# Patient Record
Sex: Male | Born: 1945
Health system: Southern US, Community
[De-identification: ages and names within clinical notes are randomized; demographics above are authoritative.]

## PROBLEM LIST (undated history)

## (undated) DIAGNOSIS — I10 Essential (primary) hypertension: Secondary | ICD-10-CM

## (undated) DIAGNOSIS — I82409 Acute embolism and thrombosis of unspecified deep veins of unspecified lower extremity: Secondary | ICD-10-CM

## (undated) DIAGNOSIS — E7800 Pure hypercholesterolemia, unspecified: Secondary | ICD-10-CM

## (undated) HISTORY — PX: OTHER SURGICAL HISTORY: SHX169

## (undated) HISTORY — PX: CYSTECTOMY: SHX5119

## (undated) HISTORY — DX: Pure hypercholesterolemia, unspecified: E78.00

## (undated) HISTORY — PX: CATARACT EXTRACTION, BILATERAL: SHX1313

## (undated) HISTORY — PX: TONSILLECTOMY: SUR1361

---

## 2001-08-30 ENCOUNTER — Ambulatory Visit (HOSPITAL_COMMUNITY): Admission: RE | Admit: 2001-08-30 | Discharge: 2001-08-30 | Payer: Self-pay | Admitting: General Surgery

## 2002-08-14 ENCOUNTER — Ambulatory Visit (HOSPITAL_COMMUNITY): Admission: RE | Admit: 2002-08-14 | Discharge: 2002-08-14 | Payer: Self-pay | Admitting: Family Medicine

## 2002-08-14 ENCOUNTER — Encounter: Payer: Self-pay | Admitting: Family Medicine

## 2003-09-17 ENCOUNTER — Ambulatory Visit (HOSPITAL_COMMUNITY): Admission: RE | Admit: 2003-09-17 | Discharge: 2003-09-17 | Payer: Self-pay | Admitting: Family Medicine

## 2004-12-01 ENCOUNTER — Ambulatory Visit: Payer: Self-pay | Admitting: Orthopedic Surgery

## 2005-08-03 ENCOUNTER — Ambulatory Visit: Payer: Self-pay | Admitting: Orthopedic Surgery

## 2007-04-20 ENCOUNTER — Ambulatory Visit (HOSPITAL_COMMUNITY): Admission: RE | Admit: 2007-04-20 | Discharge: 2007-04-20 | Payer: Self-pay | Admitting: Ophthalmology

## 2007-05-30 ENCOUNTER — Ambulatory Visit (HOSPITAL_BASED_OUTPATIENT_CLINIC_OR_DEPARTMENT_OTHER): Admission: RE | Admit: 2007-05-30 | Discharge: 2007-05-30 | Payer: Self-pay | Admitting: Urology

## 2007-12-12 ENCOUNTER — Ambulatory Visit (HOSPITAL_COMMUNITY): Admission: RE | Admit: 2007-12-12 | Discharge: 2007-12-12 | Payer: Self-pay | Admitting: Family Medicine

## 2008-01-23 ENCOUNTER — Ambulatory Visit (HOSPITAL_COMMUNITY): Admission: RE | Admit: 2008-01-23 | Discharge: 2008-01-23 | Payer: Self-pay | Admitting: General Surgery

## 2008-01-23 ENCOUNTER — Encounter (INDEPENDENT_AMBULATORY_CARE_PROVIDER_SITE_OTHER): Payer: Self-pay | Admitting: General Surgery

## 2011-03-03 NOTE — H&P (Signed)
NAME:  Matthew Payne, Matthew Payne               ACCOUNT NO.:  [id number]   MEDICAL RECORD NO.:  [id number]          PATIENT TYPE:  AMB   LOCATION:  DAY                           FACILITY:  [facility]   PHYSICIAN:  Tilford Pillar, MD      DATE OF BIRTH:  [birth year]   DATE OF ADMISSION:  DATE OF DISCHARGE:  LH                              HISTORY & PHYSICAL   CHIEF COMPLAINT:  1. Left second finger lesion.  2. Right ear lesion.   HISTORY OF PRESENT ILLNESS:  The patient is a 65 year old male who  presented to my office from a referral from Dr. Nobie Putnam with two  separate issues. The first was a small nodule in the ventral aspect of  his second finger of his left hand. This was over the distal phalange  just proximal to the nailbed. He had noticed this for quite a while with  no significant change in size. It is not painful other than upon  traumatizing with hitting it against surfaces while working, but he has  not noticed any discharge, no bleeding, has had no paresthesias around  the area. He has had it incised before with no significant resolution.  His second issue is that he presented with some dry, tender excoriation  on his right ear on the superior portion of the arch. He had a prior  lesion which was excised by a otolaryngologist in [place] for  suspicion of sun-damage exposure and possible early malignancy. This was  excised quite a while ago. He thought it had healed, and then noted to  have increasing redness and discomfort in this area. He has had no  drainage. He has had no additional treatment.   PAST MEDICAL HISTORY:  Hypercholesterolemia.   PAST SURGICAL HISTORY:  Above mentioned excision on the right ear.  Otherwise, unremarkable.   MEDICATIONS:  1. Simvastatin.  2. Zolpidem.  3. Daily vitamins.   ALLERGIES:  No known drug allergies.   SOCIAL HISTORY:  He is not a current tobacco smoker. He quit in 1979. No  alcohol use. No recreational drug use. Occupation:  Naval architect.  He  does have pertinent family history of cancers. Otherwise, unremarkable.   REVIEW OF SYSTEMS:  CONSTITUTIONAL:  Unremarkable.  EYES:  Unremarkable.  EARS, NOSE, THROAT:  Unremarkable.  RESPIRATORY:  Unremarkable.  CARDIOVASCULAR:  Unremarkable.  GASTROINTESTINAL:  Unremarkable.  GENITOURINARY:  Unremarkable.  MUSCULOSKELETAL:  Unremarkable.  SKIN:  As per history of present illness with both finger and ear  lesions. Otherwise unremarkable.  ENDOCRINE:  Unremarkable.  NEURO:  Unremarkable.   PHYSICAL EXAMINATION:  GENERAL:  The patient is a healthy-appearing male  in no acute distress. He is alert and oriented x3.  HEENT:  Scalp:  No deformities. No masses. Eyes:  Pupils equal, round,  and reactive. Extraocular movements are intact. No conjunctival pallor  is noted. On evaluation of the ears, he has no diminished hearing. On  the right ear, he does have a flat, plaque-like lesion which is dry with  some flaking skin noted. No ulceration. This is nontender, somewhat  rubor in color, nonblanching.  Oral:  Mucosa is pink, moist.  NECK:  Trachea is midline. No cervical lymphadenopathy is appreciated.  PULMONARY:  Unlabored respirations. He is clear to auscultation in the  bilateral lung fields.  CARDIOVASCULAR:  Regular rate and rhythm. He has 2+ radial pulses  bilaterally.  ABDOMEN:  Soft, no masses. Nontender.  SKIN:  Warm and dry. On his left second digit, he does have a verrucous  lesion on the ventral surface of the distal phalanx. This is nontender.  It does not appear to be attached to underlying surfaces.   ASSESSMENT AND PLAN:  Seborrheic keratosis or similar sun damage of the  right ear and verrucous lesion of left ventral second digit.  1. In regards to the ear, we did discuss the patient's cautious      monitoring of this area. Should it continue or continue to cause      him problems I would recommend referral to a dermatologist for      evaluation and treatment. At  this point, would not recommend      additional debridement or biopsying of the site unless changes      occur. The patient understands this and will continue to monitor.  2. In regards to the verrucous of the left second finger I did discuss      options of freezing, cauterization as well as excision. Due to the      location I think from a pain and comfort standpoint a simple      excision would be beneficial. Due to the location and limiting      local anesthetic and avoiding any epinephrine I would recommend      doing this in the minor procedure room at the hospital. This will      be scheduled at the patient's earliest convenience. The risks,      benefits, and alternatives of the excision were discussed with the      patient.      Tilford Pillar, MD  Electronically Signed     BZ/MEDQ  D:  01/03/2008  T:  01/03/2008  Job:  [id number]   cc:   Tilford Pillar, MD  Fax: [phone]

## 2011-03-03 NOTE — Op Note (Signed)
NAME:  Matthew Payne, Matthew Payne               ACCOUNT NO.:  [id number]   MEDICAL RECORD NO.:  [id number]          PATIENT TYPE:  AMB   LOCATION:  [facility]                         FACILITY:  [facility]   PHYSICIAN:  Mark C. Vernie Ammons, M.D.  DATE OF BIRTH:  1945/12/13   DATE OF PROCEDURE:  05/30/2007  DATE OF DISCHARGE:                               OPERATIVE REPORT   PREOPERATIVE DIAGNOSIS:  Peyronie's disease.   POSTOPERATIVE DIAGNOSIS:  Peyronie's disease.   PROCEDURE PERFORMED:  Nesbit plication.   SURGEON:  Mark C. Vernie Ammons, M.D.   RESIDENT:  Roselyn Bering, M.D.   ANESTHESIA:  General.   INDICATION FOR PROCEDURE:  Matthew Payne is a 65 year old male with a  history of Peyronie's plaques.  He notes dorsal curvature.  He desired  surgical correction.  He has been informed of the risks and benefits of  the procedure including residual curvature and shortening of the penile  shaft.   DESCRIPTION OF PROCEDURE:  The patient was brought to the operating  room.  He was identified by his arm band.  Informed consent was verified  and a preoperative timeout was performed.  The operative site was shaved  and prepped and draped in usual fashion.  A 16-French Foley catheter was  inserted transurethrally into the bladder and the balloon inflated with  10 mL of sterile water and the bladder was drained.  A circumcising  incision was made in the subcoronal location.  Blunt and sharp  dissection was then used to deglove the penile shaft.  Once the skin had  been taken back, we used Bovie cautery to gain hemostasis.  A tourniquet  was then applied at the base of the penis and an artificial erection was  induced with injectable saline.  We noted a distal dorsal curvature.  The point of maximum curvature was marked on the opposite site on the  ventrum of the corpus cavernosum.  We then made 2 linear incisions at  the site of maximum curvature.  A 3-0 Ethibond was then used to close  the apices of these in a  Heineke-Mikulicz fashion.  We then re-injected  and noted that there was still some residual curvature and lateral glans  tilt.  A second incision was made laterally and about 1 cm more distal  on the penile shaft in the left corpora; this again was closed in a  Heineke-Mikulicz fashion.  We then again tested with injectable saline  and noted a straight shaft and an excellent cosmetic result.  Bovie  cautery was used to again gain excellent hemostasis.  The skin of the  circumcising incision was reapproximated and closed with 2 running 3-0  chromics.  A dressing was then applied.  The patient tolerated the  procedure well and there were no complications.   Mark C. Vernie Ammons, M.D. was the attending primary responsive physician and  was present and participated in all aspects of the procedure.   DISPOSITION:  The patient was extubated uneventfully and transferred  safely to the postanesthesia care unit.  He was given a prescription for  Tylox, #36, and Keflex 250 mg, #  9.     ______________________________  Roselyn Bering, MD      Veverly Fells. Vernie Ammons, M.D.  Electronically Signed    JR/MEDQ  D:  05/30/2007  T:  05/31/2007  Job:  [id number]

## 2011-04-30 ENCOUNTER — Encounter (INDEPENDENT_AMBULATORY_CARE_PROVIDER_SITE_OTHER): Payer: Self-pay | Admitting: Internal Medicine

## 2011-05-27 MED ORDER — SODIUM CHLORIDE 0.45 % IV SOLN
Freq: Once | INTRAVENOUS | Status: AC
  Administered 2011-05-28: 09:00:00 via INTRAVENOUS

## 2011-05-28 ENCOUNTER — Ambulatory Visit (HOSPITAL_COMMUNITY)
Admission: RE | Admit: 2011-05-28 | Discharge: 2011-05-28 | Disposition: A | Discharge status: Home / Self Care | Payer: 59 | Source: Ambulatory Visit | Attending: Internal Medicine | Admitting: Internal Medicine

## 2011-05-28 ENCOUNTER — Encounter (HOSPITAL_COMMUNITY): Payer: Self-pay | Admitting: *Deleted

## 2011-05-28 ENCOUNTER — Encounter (INDEPENDENT_AMBULATORY_CARE_PROVIDER_SITE_OTHER): Payer: Self-pay | Admitting: Internal Medicine

## 2011-05-28 ENCOUNTER — Other Ambulatory Visit (INDEPENDENT_AMBULATORY_CARE_PROVIDER_SITE_OTHER): Payer: Self-pay | Admitting: Internal Medicine

## 2011-05-28 ENCOUNTER — Encounter (HOSPITAL_COMMUNITY)
Admission: RE | Disposition: A | Discharge status: Home / Self Care | Payer: Self-pay | Source: Ambulatory Visit | Attending: Internal Medicine

## 2011-05-28 DIAGNOSIS — Z1211 Encounter for screening for malignant neoplasm of colon: Secondary | ICD-10-CM

## 2011-05-28 DIAGNOSIS — D126 Benign neoplasm of colon, unspecified: Secondary | ICD-10-CM | POA: Insufficient documentation

## 2011-05-28 DIAGNOSIS — Z7982 Long term (current) use of aspirin: Secondary | ICD-10-CM | POA: Insufficient documentation

## 2011-05-28 DIAGNOSIS — I10 Essential (primary) hypertension: Secondary | ICD-10-CM | POA: Insufficient documentation

## 2011-05-28 DIAGNOSIS — K644 Residual hemorrhoidal skin tags: Secondary | ICD-10-CM | POA: Insufficient documentation

## 2011-05-28 DIAGNOSIS — Z79899 Other long term (current) drug therapy: Secondary | ICD-10-CM | POA: Insufficient documentation

## 2011-05-28 HISTORY — PX: COLONOSCOPY: SHX5424

## 2011-05-28 HISTORY — DX: Essential (primary) hypertension: I10

## 2011-05-28 SURGERY — COLONOSCOPY
Anesthesia: Moderate Sedation

## 2011-05-28 MED ORDER — MEPERIDINE HCL 50 MG/ML IJ SOLN
INTRAMUSCULAR | Status: DC | PRN
  Administered 2011-05-28 (×2): 25 mg via INTRAVENOUS

## 2011-05-28 MED ORDER — MIDAZOLAM HCL 5 MG/5ML IJ SOLN
INTRAMUSCULAR | Status: DC | PRN
  Administered 2011-05-28: 1 mg via INTRAVENOUS
  Administered 2011-05-28 (×2): 2 mg via INTRAVENOUS

## 2011-05-28 NOTE — Op Note (Signed)
COLONOSCOPY PROCEDURE REPORT  PATIENT:  Matthew Payne  MR#:  [id number] Birthdate:  09-21-1946, 65 y.o., male Endoscopist:  Dr. Malissa Hippo, MD Referred By:  Dr. Artis Delay. MD. Procedure Date: 05/28/2011  Procedure:   Colonoscopy  Indications:  Average risk screening colonoscopy  Informed consent; procedure and risks were reviewed with the patient; questions were answered and informed consent was obtained.  Medications:  Demerol 50 mg IV Versed 5 mg IV  Description of procedure:  After a digital rectal exam was performed, that colonoscope was advanced from the anus through the rectum and colon to the area of the cecum, ileocecal valve and appendiceal orifice. The cecum was deeply intubated. These structures were well-seen and photographed for the record. From the level of the cecum and ileocecal valve, the scope was slowly and cautiously withdrawn. The mucosal surfaces were carefully surveyed utilizing scope tip to flexion to facilitate fold flattening as needed. The scope was pulled down into the rectum where a thorough exam including retroflexion was performed.  Findings:   Prep excellent. 2 small polyps at sigmoid colon; these are ablated via cold biopsy and submitted in one container. Small external hemorrhoids.  Therapeutic/Diagnostic Maneuvers Performed:  Both polyps moved via cold biopsy as above.  Complications:  None  Cecal Withdrawal Time:  14 minutes  Impression:  2 small polyps ablated via cold biopsy from sigmoid colon and submitted in one container. Small external hemorrhoids.  Recommendations:  Resume  usual medications and diet. No driving for 78-GNFA. Physician will contact you with biopsy results.  Lashandra Arauz U  05/28/2011 9:50 AM  CC: Dr. Artis Delay MD.

## 2011-05-28 NOTE — H&P (Signed)
Matthew Payne is an 65 y.o. male.   Chief Complaint: For colonoscopy HPI: Patient is 65 year old Caucasian male who is in for average risk screening colonoscopy. His last exam was 10 years ago. He denies recent change in his bowel habits abdominal pain melena or rectal bleeding. He has good appetite and has not lost any weight recently. Him history is negative for colorectal carcinoma.  Past Medical History  Diagnosis Date  . Hypertension     Past Surgical History  Procedure Date  . Tonsillectomy   . Cystectomy     back & leg    No family history on file. Social History:  reports that he quit smoking about 33 years ago. He does not have any smokeless tobacco history on file. He reports that he drinks alcohol. He reports that he does not use illicit drugs.  Allergies: No Known Allergies  Medications Prior to Admission  Medication Dose Route Frequency Provider Last Rate Last Dose  . 0.45 % sodium chloride infusion   Intravenous Once Malissa Hippo, MD 20 mL/hr at 05/28/11 0859     Medications Prior to Admission  Medication Sig Dispense Refill  . aspirin 81 MG tablet Take 81 mg by mouth daily.        . enalapril (VASOTEC) 5 MG tablet Take 5 mg by mouth daily.        . fish oil-omega-3 fatty acids 1000 MG capsule Take 2 g by mouth daily.        . pravastatin (PRAVACHOL) 10 MG tablet Take 5 mg by mouth daily.        . vitamin C (ASCORBIC ACID) 500 MG tablet Take 500 mg by mouth daily.        . vitamin E 100 UNIT capsule Take 100 Units by mouth daily.          No results found for this or any previous visit (from the past 48 hour(s)). No results found.  Review of Systems  Constitutional: Negative for weight loss.  Gastrointestinal: Negative for abdominal pain, diarrhea, constipation, blood in stool and melena.    Blood pressure 149/73, pulse 69, temperature 97.9 F (36.6 C), temperature source Oral, resp. rate 18, height 6' (1.829 m), weight 181 lb (82.101 kg), SpO2  99.00%. Physical Exam  Constitutional: He appears well-developed and well-nourished.  HENT:  Mouth/Throat: Oropharynx is clear and moist.  Eyes: Conjunctivae are normal. No scleral icterus.  Neck: No thyromegaly present.  Cardiovascular: Normal rate, regular rhythm and normal heart sounds.   No murmur heard. Respiratory: Breath sounds normal.  GI: Soft. He exhibits no distension and no mass. There is no tenderness.  Musculoskeletal: He exhibits no edema.  Lymphadenopathy:    He has no cervical adenopathy.  Neurological: He is alert.  Skin: Skin is warm and dry.     Assessment/Plan Average risk screening colonoscopy. Procedure and risks reviewed with the patient and he is agreeable.  Matthew Payne U 05/28/2011, 9:21 AM

## 2011-06-02 ENCOUNTER — Encounter (INDEPENDENT_AMBULATORY_CARE_PROVIDER_SITE_OTHER): Payer: Self-pay | Admitting: *Deleted

## 2011-06-04 ENCOUNTER — Encounter (HOSPITAL_COMMUNITY): Payer: Self-pay | Admitting: Internal Medicine

## 2011-08-03 LAB — POCT HEMOGLOBIN-HEMACUE
Hemoglobin: 16.1
Operator id: 114531

## 2011-08-05 LAB — HEMOGLOBIN AND HEMATOCRIT, BLOOD
HCT: 42.2
Hemoglobin: 14.3

## 2012-06-28 ENCOUNTER — Encounter (HOSPITAL_COMMUNITY): Payer: Self-pay | Admitting: Pharmacy Technician

## 2012-06-28 NOTE — H&P (Signed)
  NTS SOAP Note  Vital Signs:  Vitals as of: 06/28/2012: Systolic 148: Diastolic 82: Heart Rate 67: Temp 97.73F: Height 72ft 0in: Weight 194Lbs 0 Ounces: Pain Level 8: BMI 26  BMI : 26.31 kg/m2  Subjective: This 66 Years 30 Months old Male presents for of    HERNIA: ,Has had increasing swelling and pain in the right groin region for some time now.  Is a truck driver.  Found by primary medical doctor to have a right inguinal hernia.  Denies nausea, vomiting.  Review of Symptoms:  Constitutional:unremarkable   Head:unremarkable    Eyes:unremarkable   Nose/Mouth/Throat:unremarkable Cardiovascular:  unremarkable   Respiratory:unremarkable   Genitourinary:unremarkable       joint and back pain Skin:unremarkable Hematolgic/Lymphatic:unremarkable     Allergic/Immunologic:unremarkable     Past Medical History:    Reviewed   Past Medical History  Surgical History: unremarkable Medical Problems:  High Blood pressure, High cholesterol Allergies: nkda Medications: baby asa, enalapril, pravastatin, temazepam, vit c and E, fish oil   Social History:Reviewed  Social History  Preferred Language: English ([place]) Race:  White Ethnicity: Not Hispanic / Latino Age: 66 Years 11 Months Marital Status:  M Alcohol: beer socially Recreational drug(s):  No   Smoking Status: Never smoker reviewed on 06/28/2012  Family History:  Reviewed   Family History  Is there a family history AV:[id number]    Objective Information: General:  Well appearing, well nourished in no distress. Head:Atraumatic; no masses; no abnormalities Heart:  RRR, no murmur Lungs:    CTA bilaterally, no wheezes, rhonchi, rales.  Breathing unlabored. Abdomen:Soft, NT/ND, no HSM, no masses.  Reducible right inguinal hernia.  No left inguinal hernia. OZ:[id number]    Assessment:Right inguinal hernia  Diagnosis &amp; Procedure:  DiagnosisCode: 550.90, ProcedureCode: 95284,    Plan:Scheduled for right inguinal herniorrhaphy on 07/01/12.   Patient Education:Alternative treatments to surgery were discussed with patient (and family).  Risks and benefits  of procedure were fully explained to the patient (and family) who gave informed consent. Patient/family questions were addressed.  Follow-up:Pending Surgery

## 2012-06-29 ENCOUNTER — Encounter (HOSPITAL_COMMUNITY): Payer: Self-pay

## 2012-06-29 NOTE — Patient Instructions (Addendum)
Your procedure is scheduled on: 07/01/2012   Report to [facility] at  9:00    AM.  Call this number if you have problems the morning of surgery: [phone]   Remember:   Do not drink or eat food:After Midnight.      Take these medicines the morning of surgery with A SIP OF WATER: Enalapril   Do not wear jewelry, make-up or nail polish.  Do not wear lotions, powders, or perfumes. You may wear deodorant.  Do not shave 48 hours prior to surgery.  Do not bring valuables to the hospital.  Contacts, dentures or bridgework may not be worn into surgery.  Leave suitcase in the car. After surgery it may be brought to your room.  For patients admitted to the hospital, checkout time is 11:00 AM the day of discharge.   Patients discharged the day of surgery will not be allowed to drive home.  Name and phone number of your driver:   Special Instructions: CHG Shower use special wash: 1/2 bottle night before surgery and 1/2 bottle morning of surgery.   Please read over the following fact sheets that you were given: Pain Booklet, MRSA  Surgical Site Infection Prevention, Anesthesia Post-op Instructions and Care and Recovery After Surgery   Hernia Repair Care After These instructions give you information on caring for yourself after your procedure. Your doctor may also give you more specific instructions. Call your doctor if you have any problems or questions after your procedure. HOME CARE   You may have changes in your poops (bowel movements).   You may have loose or watery poop (diarrhea).   You may be not able to poop.   Your bowels will slowly get back to normal.   Do not eat any food that makes you sick to your stomach (nauseous). Eat small meals 4 to 6 times a day instead of 3 large ones.   Do not drink pop. It will give you gas.   Do not drink alcohol.   Do not lift anything heavier than 10 pounds. This is about the weight of a gallon of milk.   Do not do anything that makes you very  tired for at least 6 weeks.   Do not get your wound wet for 2 days.   You may take a sponge bath during this time.   After 2 days you may take a shower. Gently pat your surgical cut (incision) dry with a towel. Do not rub it.   For men: You may have been given an athletic supporter (scrotal support) before you left the hospital. It holds your scrotum and testicles closer to your body so there is no strain on your wound. Wear the supporter until your doctor tells you that you do not need it anymore.  GET HELP RIGHT AWAY IF:  You have watery poop, or cannot poop for more than 3 days.   You feel sick to your stomach or throw up (vomit) more than 2 or 3 times.   You have temperature by mouth above 102 F (38.9 C).   You see redness or puffiness (swelling) around your wound.   You see yellowish white fluid (pus) coming from your wound.   You see a bulge or bump in your lower belly (abdomen) or near your groin.   You develop a rash, trouble breathing, or any other symptoms from medicines taken.  MAKE SURE YOU:  Understand these instructions.   Will watch your condition.   Will  get help right away if your are not doing well or get worse.  Document Released: 09/17/2008 Document Revised: 09/24/2011 Document Reviewed: 09/17/2008 [facility] Patient Information 2012 [place], [place]. PATIENT INSTRUCTIONS POST-ANESTHESIA  IMMEDIATELY FOLLOWING SURGERY:  Do not drive or operate machinery for the first twenty four hours after surgery.  Do not make any important decisions for twenty four hours after surgery or while taking narcotic pain medications or sedatives.  If you develop intractable nausea and vomiting or a severe headache please notify your doctor immediately.  FOLLOW-UP:  Please make an appointment with your surgeon as instructed. You do not need to follow up with anesthesia unless specifically instructed to do so.  WOUND CARE INSTRUCTIONS (if applicable):  Keep a dry clean dressing on  the anesthesia/puncture wound site if there is drainage.  Once the wound has quit draining you may leave it open to air.  Generally you should leave the bandage intact for twenty four hours unless there is drainage.  If the epidural site drains for more than 36-48 hours please call the anesthesia department.  QUESTIONS?:  Please feel free to call your physician or the hospital operator if you have any questions, and they will be happy to assist you.

## 2012-06-30 ENCOUNTER — Encounter (HOSPITAL_COMMUNITY)
Admission: RE | Admit: 2012-06-30 | Discharge: 2012-06-30 | Disposition: A | Discharge status: Home / Self Care | Payer: BC Managed Care – PPO | Source: Ambulatory Visit | Attending: General Surgery | Admitting: General Surgery

## 2012-06-30 ENCOUNTER — Encounter (HOSPITAL_COMMUNITY): Payer: Self-pay

## 2012-06-30 LAB — CBC WITH DIFFERENTIAL/PLATELET
Basophils Relative: 0 % (ref 0–1)
Eosinophils Absolute: 0.1 10*3/uL (ref 0.0–0.7)
Eosinophils Relative: 2 % (ref 0–5)
HCT: 44.6 % (ref 39.0–52.0)
Hemoglobin: 14.9 g/dL (ref 13.0–17.0)
MCH: 30.2 pg (ref 26.0–34.0)
MCHC: 33.4 g/dL (ref 30.0–36.0)
MCV: 90.3 fL (ref 78.0–100.0)
Monocytes Absolute: 0.6 10*3/uL (ref 0.1–1.0)
Monocytes Relative: 10 % (ref 3–12)

## 2012-06-30 LAB — BASIC METABOLIC PANEL
BUN: 20 mg/dL (ref 6–23)
Chloride: 105 mEq/L (ref 96–112)
Glucose, Bld: 174 mg/dL — ABNORMAL HIGH (ref 70–99)
Potassium: 4.1 mEq/L (ref 3.5–5.1)

## 2012-06-30 LAB — SURGICAL PCR SCREEN: MRSA, PCR: NEGATIVE

## 2012-07-01 ENCOUNTER — Encounter (HOSPITAL_COMMUNITY): Payer: Self-pay | Admitting: Anesthesiology

## 2012-07-01 ENCOUNTER — Ambulatory Visit (HOSPITAL_COMMUNITY): Payer: BC Managed Care – PPO | Admitting: Anesthesiology

## 2012-07-01 ENCOUNTER — Ambulatory Visit (HOSPITAL_COMMUNITY)
Admission: RE | Admit: 2012-07-01 | Discharge: 2012-07-01 | Disposition: A | Discharge status: Home / Self Care | Payer: BC Managed Care – PPO | Source: Ambulatory Visit | Attending: General Surgery | Admitting: General Surgery

## 2012-07-01 ENCOUNTER — Encounter (HOSPITAL_COMMUNITY)
Admission: RE | Disposition: A | Discharge status: Home / Self Care | Payer: Self-pay | Source: Ambulatory Visit | Attending: General Surgery

## 2012-07-01 ENCOUNTER — Encounter (HOSPITAL_COMMUNITY): Payer: Self-pay | Admitting: *Deleted

## 2012-07-01 DIAGNOSIS — E7800 Pure hypercholesterolemia, unspecified: Secondary | ICD-10-CM | POA: Insufficient documentation

## 2012-07-01 DIAGNOSIS — I10 Essential (primary) hypertension: Secondary | ICD-10-CM | POA: Insufficient documentation

## 2012-07-01 DIAGNOSIS — K4090 Unilateral inguinal hernia, without obstruction or gangrene, not specified as recurrent: Secondary | ICD-10-CM | POA: Insufficient documentation

## 2012-07-01 DIAGNOSIS — Z01812 Encounter for preprocedural laboratory examination: Secondary | ICD-10-CM | POA: Insufficient documentation

## 2012-07-01 HISTORY — PX: INGUINAL HERNIA REPAIR: SHX194

## 2012-07-01 SURGERY — REPAIR, HERNIA, INGUINAL, ADULT
Anesthesia: General | Site: Abdomen | Laterality: Right | Wound class: Clean

## 2012-07-01 MED ORDER — PROPOFOL 10 MG/ML IV BOLUS
INTRAVENOUS | Status: DC | PRN
  Administered 2012-07-01: 200 mg via INTRAVENOUS

## 2012-07-01 MED ORDER — LACTATED RINGERS IV SOLN
INTRAVENOUS | Status: DC | PRN
  Administered 2012-07-01 (×2): via INTRAVENOUS

## 2012-07-01 MED ORDER — OXYCODONE-ACETAMINOPHEN 7.5-325 MG PO TABS: 1.0000 | ORAL_TABLET | ORAL | Status: AC | PRN

## 2012-07-01 MED ORDER — CEFAZOLIN SODIUM-DEXTROSE 2-3 GM-% IV SOLR
INTRAVENOUS | Status: AC
  Filled 2012-07-01: qty 50

## 2012-07-01 MED ORDER — MIDAZOLAM HCL 2 MG/2ML IJ SOLN
INTRAMUSCULAR | Status: AC
  Filled 2012-07-01: qty 2

## 2012-07-01 MED ORDER — CEFAZOLIN SODIUM-DEXTROSE 2-3 GM-% IV SOLR
INTRAVENOUS | Status: DC | PRN
  Administered 2012-07-01: 2 g via INTRAVENOUS

## 2012-07-01 MED ORDER — CEFAZOLIN SODIUM-DEXTROSE 2-3 GM-% IV SOLR: 2.0000 g | INTRAVENOUS | Status: DC

## 2012-07-01 MED ORDER — MIDAZOLAM HCL 2 MG/2ML IJ SOLN
1.0000 mg | INTRAMUSCULAR | Status: DC | PRN
  Administered 2012-07-01 (×2): 2 mg via INTRAVENOUS

## 2012-07-01 MED ORDER — ENOXAPARIN SODIUM 40 MG/0.4ML ~~LOC~~ SOLN
40.0000 mg | Freq: Once | SUBCUTANEOUS | Status: AC
  Administered 2012-07-01: 40 mg via SUBCUTANEOUS

## 2012-07-01 MED ORDER — CHLORHEXIDINE GLUCONATE 4 % EX LIQD: 1.0000 "application " | Freq: Once | CUTANEOUS | Status: DC

## 2012-07-01 MED ORDER — ENOXAPARIN SODIUM 40 MG/0.4ML ~~LOC~~ SOLN
SUBCUTANEOUS | Status: AC
  Filled 2012-07-01: qty 0.4

## 2012-07-01 MED ORDER — FENTANYL CITRATE 0.05 MG/ML IJ SOLN
25.0000 ug | INTRAMUSCULAR | Status: DC | PRN
  Administered 2012-07-01 (×4): 50 ug via INTRAVENOUS

## 2012-07-01 MED ORDER — MUPIROCIN 2 % EX OINT
TOPICAL_OINTMENT | CUTANEOUS | Status: AC
  Filled 2012-07-01: qty 22

## 2012-07-01 MED ORDER — FENTANYL CITRATE 0.05 MG/ML IJ SOLN
INTRAMUSCULAR | Status: DC | PRN
  Administered 2012-07-01: 50 ug via INTRAVENOUS
  Administered 2012-07-01 (×2): 25 ug via INTRAVENOUS

## 2012-07-01 MED ORDER — FENTANYL CITRATE 0.05 MG/ML IJ SOLN
INTRAMUSCULAR | Status: AC
  Filled 2012-07-01: qty 2

## 2012-07-01 MED ORDER — LIDOCAINE HCL (CARDIAC) 10 MG/ML IV SOLN
INTRAVENOUS | Status: DC | PRN
  Administered 2012-07-01: 50 mg via INTRAVENOUS

## 2012-07-01 MED ORDER — MUPIROCIN 2 % EX OINT
TOPICAL_OINTMENT | Freq: Two times a day (BID) | CUTANEOUS | Status: DC
  Administered 2012-07-01: 1 via NASAL

## 2012-07-01 MED ORDER — BUPIVACAINE HCL (PF) 0.5 % IJ SOLN
INTRAMUSCULAR | Status: DC | PRN
  Administered 2012-07-01: 8 mL

## 2012-07-01 MED ORDER — KETOROLAC TROMETHAMINE 30 MG/ML IJ SOLN
INTRAMUSCULAR | Status: AC
  Filled 2012-07-01: qty 1

## 2012-07-01 MED ORDER — ONDANSETRON HCL 4 MG/2ML IJ SOLN
4.0000 mg | Freq: Once | INTRAMUSCULAR | Status: AC
  Administered 2012-07-01: 4 mg via INTRAVENOUS

## 2012-07-01 MED ORDER — SODIUM CHLORIDE 0.9 % IR SOLN
Status: DC | PRN
  Administered 2012-07-01: 1000 mL

## 2012-07-01 MED ORDER — BUPIVACAINE HCL (PF) 0.5 % IJ SOLN
INTRAMUSCULAR | Status: AC
  Filled 2012-07-01: qty 30

## 2012-07-01 MED ORDER — ONDANSETRON HCL 4 MG/2ML IJ SOLN
INTRAMUSCULAR | Status: AC
  Filled 2012-07-01: qty 2

## 2012-07-01 MED ORDER — KETOROLAC TROMETHAMINE 30 MG/ML IJ SOLN
30.0000 mg | Freq: Once | INTRAMUSCULAR | Status: AC
  Administered 2012-07-01: 30 mg via INTRAVENOUS

## 2012-07-01 MED ORDER — ONDANSETRON HCL 4 MG/2ML IJ SOLN: 4.0000 mg | Freq: Once | INTRAMUSCULAR | Status: DC | PRN

## 2012-07-01 MED ORDER — LACTATED RINGERS IV SOLN
INTRAVENOUS | Status: DC
  Administered 2012-07-01: 1000 mL via INTRAVENOUS

## 2012-07-01 MED ORDER — OXYCODONE-ACETAMINOPHEN 7.5-325 MG PO TABS: 1.0000 | ORAL_TABLET | ORAL | Status: DC | PRN

## 2012-07-01 SURGICAL SUPPLY — 42 items
ADH SKN CLS APL DERMABOND .7 (GAUZE/BANDAGES/DRESSINGS) ×1
BAG HAMPER (MISCELLANEOUS) ×2 IMPLANT
CLOTH BEACON ORANGE TIMEOUT ST (SAFETY) ×2 IMPLANT
COVER LIGHT HANDLE STERIS (MISCELLANEOUS) ×4 IMPLANT
DECANTER SPIKE VIAL GLASS SM (MISCELLANEOUS) ×2 IMPLANT
DERMABOND ADVANCED (GAUZE/BANDAGES/DRESSINGS) ×1
DERMABOND ADVANCED .7 DNX12 (GAUZE/BANDAGES/DRESSINGS) ×1 IMPLANT
DRAIN PENROSE 18X.75 LTX STRL (MISCELLANEOUS) ×2 IMPLANT
ELECT REM PT RETURN 9FT ADLT (ELECTROSURGICAL) ×2
ELECTRODE REM PT RTRN 9FT ADLT (ELECTROSURGICAL) ×1 IMPLANT
FORMALIN 10 PREFIL 120ML (MISCELLANEOUS) IMPLANT
GLOVE BIO SURGEON STRL SZ7.5 (GLOVE) ×2 IMPLANT
GLOVE BIOGEL PI IND STRL 7.0 (GLOVE) IMPLANT
GLOVE BIOGEL PI INDICATOR 7.0 (GLOVE) ×2
GLOVE ECLIPSE 6.5 STRL STRAW (GLOVE) ×2 IMPLANT
GLOVE EXAM NITRILE MD LF STRL (GLOVE) ×1 IMPLANT
GOWN STRL REIN XL XLG (GOWN DISPOSABLE) ×6 IMPLANT
INST SET MINOR GENERAL (KITS) ×2 IMPLANT
KIT ROOM TURNOVER APOR (KITS) ×2 IMPLANT
MANIFOLD NEPTUNE II (INSTRUMENTS) ×2 IMPLANT
MESH HERNIA 1.6X1.9 PLUG LRG (Mesh General) IMPLANT
MESH HERNIA PLUG LRG (Mesh General) ×1 IMPLANT
MESH MARLEX PLUG MEDIUM (Mesh General) ×1 IMPLANT
NDL HYPO 25X1 1.5 SAFETY (NEEDLE) ×1 IMPLANT
NEEDLE HYPO 25X1 1.5 SAFETY (NEEDLE) ×2 IMPLANT
NS IRRIG 1000ML POUR BTL (IV SOLUTION) ×2 IMPLANT
PACK MINOR (CUSTOM PROCEDURE TRAY) ×2 IMPLANT
PAD ARMBOARD 7.5X6 YLW CONV (MISCELLANEOUS) ×2 IMPLANT
SET BASIN LINEN APH (SET/KITS/TRAYS/PACK) ×2 IMPLANT
SOL PREP PROV IODINE SCRUB 4OZ (MISCELLANEOUS) ×2 IMPLANT
SUT NOVA NAB GS-22 2 2-0 T-19 (SUTURE) ×3 IMPLANT
SUT NOVAFIL NAB HGS22 2-0 30IN (SUTURE) IMPLANT
SUT SILK 3 0 (SUTURE)
SUT SILK 3-0 18XBRD TIE 12 (SUTURE) IMPLANT
SUT VIC AB 2-0 CT1 27 (SUTURE) ×2
SUT VIC AB 2-0 CT1 TAPERPNT 27 (SUTURE) ×1 IMPLANT
SUT VIC AB 3-0 SH 27 (SUTURE) ×2
SUT VIC AB 3-0 SH 27X BRD (SUTURE) ×1 IMPLANT
SUT VIC AB 4-0 PS2 27 (SUTURE) ×2 IMPLANT
SUT VICRYL AB 3 0 TIES (SUTURE) ×1 IMPLANT
SYR CONTROL 10ML LL (SYRINGE) ×2 IMPLANT
TOWEL OR 17X26 4PK STRL BLUE (TOWEL DISPOSABLE) ×1 IMPLANT

## 2012-07-01 NOTE — Anesthesia Procedure Notes (Signed)
Procedure Name: LMA Insertion Date/Time: 07/01/2012 10:41 AM Performed by: Carolyne Littles, AMY L Pre-anesthesia Checklist: Patient identified, Patient being monitored, Emergency Drugs available, Timeout performed and Suction available Patient Re-evaluated:Patient Re-evaluated prior to inductionOxygen Delivery Method: Circle system utilized Preoxygenation: Pre-oxygenation with 100% oxygen Intubation Type: IV induction Ventilation: Mask ventilation without difficulty LMA: LMA inserted LMA Size: 4.0 Number of attempts: 1 Placement Confirmation: positive ETCO2 and breath sounds checked- equal and bilateral Tube secured with: Tape Dental Injury: Teeth and Oropharynx as per pre-operative assessment

## 2012-07-01 NOTE — Interval H&P Note (Signed)
History and Physical Interval Note:  07/01/2012 10:24 AM  Matthew Payne  has presented today for surgery, with the diagnosis of right inguinal hernia  The various methods of treatment have been discussed with the patient and family. After consideration of risks, benefits and other options for treatment, the patient has consented to  Procedure(s) (LRB) with comments: HERNIA REPAIR INGUINAL ADULT (Right) as a surgical intervention .  The patient's history has been reviewed, patient examined, no change in status, stable for surgery.  I have reviewed the patient's chart and labs.  Questions were answered to the patient's satisfaction.     [name]

## 2012-07-01 NOTE — Op Note (Signed)
Patient:  Matthew Payne  DOB:  [birth year]  MRN:  [id number]   Preop Diagnosis:  Right inguinal hernia  Postop Diagnosis:  Same  Procedure:  Right inguinal herniorrhaphy  Surgeon:  Franky Macho, M.D.  Anes:  General  Indications:  Patient is a 66 year old white male who presents with a symptomatic right inguinal hernia. The risks and benefits of the procedure including bleeding, infection, pain, and the possibility of recurrence of the hernia were fully explained to the patient, who gave informed consent.  Procedure note:  The patient was placed in the supine position. After general anesthesia was administered, the right groin region was prepped and draped using usual sterile technique with Betadine. Surgical site confirmation was performed.  A transverse incision was made in the right groin region down to the external oblique aponeuroses. The aponeuroses was incised to the external ring. A Penrose drain was placed around the spermatic cord. The vas deferens was noted within the spermatic cord. The ilioinguinal nerve was identified and retracted superiorly from the operative field. The patient had pulse a direct and indirect hernia. A lipoma of the cord was excised. The indirect hernia sac was inverted at the peritoneal reflection. A medium-sized Bard PerFix plug was then inserted and secured to the transversalis fascia using 2-0 Novafil suture. The direct hernia was incised at its base and inverted. A large size Bard prefix plug was also inserted and secured circumferentially to the transversalis fascia using a 2-0 Novafil interrupted suture. An onlay patch was then placed along the floor of the inguinal canal and secured superiorly to the conjoined tendon and inferiorly to the shelving edge of Poupart's ligament using 2-0 Novafil interrupted sutures. The internal ring was recreated using a 20 Novafil interrupted suture. The external oblique aponeuroses was reapproximated using a 2-0 Vicryl  running suture. Subcutaneous layer was reapproximated using 3-0 Vicryl interrupted sutures. The skin was closed using a 4-0 Vicryl subcuticular suture. 0.5% Sensorcaine was instilled the surrounding wound. Dermabond was then applied.  All tape and needle counts were correct at the end of the procedure. Patient was awakened and transferred to PACU in stable condition.  Complications:  None  EBL:   minimal  Specimen:  None

## 2012-07-01 NOTE — Preoperative (Signed)
Beta Blockers   Reason not to administer Beta Blockers:Not Applicable 

## 2012-07-01 NOTE — Transfer of Care (Signed)
Immediate Anesthesia Transfer of Care Note  Patient: Matthew Payne  Procedure(s) Performed: Procedure(s) (LRB) with comments: HERNIA REPAIR INGUINAL ADULT (Right) - Right Inguinal Herniorraphy with Mesh INSERTION OF MESH (Right)  Patient Location: PACU  Anesthesia Type: General  Level of Consciousness: awake, alert , oriented and patient cooperative  Airway & Oxygen Therapy: Patient Spontanous Breathing and Patient connected to nasal cannula oxygen  Post-op Assessment: Report given to PACU RN and Post -op Vital signs reviewed and stable  Post vital signs: Reviewed and stable  Complications: No apparent anesthesia complications

## 2012-07-01 NOTE — Anesthesia Postprocedure Evaluation (Signed)
  Anesthesia Post-op Note  Patient: Matthew Payne  Procedure(s) Performed: Procedure(s) (LRB) with comments: HERNIA REPAIR INGUINAL ADULT (Right) - Right Inguinal Herniorraphy with Mesh INSERTION OF MESH (Right)  Patient Location: PACU  Anesthesia Type: General  Level of Consciousness: awake, alert , oriented and patient cooperative  Airway and Oxygen Therapy: Patient Spontanous Breathing, Patient connected to nasal cannula oxygen and aerosol face mask  Post-op Pain: mild  Post-op Assessment: Post-op Vital signs reviewed, Patient's Cardiovascular Status Stable, Respiratory Function Stable, Patent Airway and No signs of Nausea or vomiting  Post-op Vital Signs: Reviewed and stable  Complications: No apparent anesthesia complications

## 2012-07-01 NOTE — Anesthesia Preprocedure Evaluation (Signed)
Anesthesia Evaluation  Patient identified by MRN, date of birth, ID band Patient awake    Reviewed: Allergy & Precautions, H&P , NPO status , Patient's Chart, lab work & pertinent test results  History of Anesthesia Complications Negative for: history of anesthetic complications  Airway Mallampati: I TM Distance: >3 FB     Dental  (+) Edentulous Upper and Edentulous Lower   Pulmonary neg pulmonary ROS,  breath sounds clear to auscultation        Cardiovascular hypertension, Pt. on medications Rhythm:Regular Rate:Normal     Neuro/Psych    GI/Hepatic   Endo/Other    Renal/GU      Musculoskeletal   Abdominal   Peds  Hematology   Anesthesia Other Findings   Reproductive/Obstetrics                           Anesthesia Physical Anesthesia Plan  ASA: II  Anesthesia Plan: General   Post-op Pain Management:    Induction: Intravenous  Airway Management Planned: LMA  Additional Equipment:   Intra-op Plan:   Post-operative Plan: Extubation in OR  Informed Consent: I have reviewed the patients History and Physical, chart, labs and discussed the procedure including the risks, benefits and alternatives for the proposed anesthesia with the patient or authorized representative who has indicated his/her understanding and acceptance.     Plan Discussed with:   Anesthesia Plan Comments:         Anesthesia Quick Evaluation

## 2012-07-05 ENCOUNTER — Encounter (HOSPITAL_COMMUNITY): Payer: Self-pay | Admitting: General Surgery

## 2014-06-27 DIAGNOSIS — Z6826 Body mass index (BMI) 26.0-26.9, adult: Secondary | ICD-10-CM | POA: Diagnosis not present

## 2014-06-27 DIAGNOSIS — R7301 Impaired fasting glucose: Secondary | ICD-10-CM | POA: Diagnosis not present

## 2014-06-27 DIAGNOSIS — Z23 Encounter for immunization: Secondary | ICD-10-CM | POA: Diagnosis not present

## 2014-06-27 DIAGNOSIS — I10 Essential (primary) hypertension: Secondary | ICD-10-CM | POA: Diagnosis not present

## 2014-06-27 DIAGNOSIS — E785 Hyperlipidemia, unspecified: Secondary | ICD-10-CM | POA: Diagnosis not present

## 2014-07-17 DIAGNOSIS — Z23 Encounter for immunization: Secondary | ICD-10-CM | POA: Diagnosis not present

## 2016-03-17 DIAGNOSIS — Z1389 Encounter for screening for other disorder: Secondary | ICD-10-CM | POA: Diagnosis not present

## 2016-03-17 DIAGNOSIS — Z6827 Body mass index (BMI) 27.0-27.9, adult: Secondary | ICD-10-CM | POA: Diagnosis not present

## 2016-03-17 DIAGNOSIS — E782 Mixed hyperlipidemia: Secondary | ICD-10-CM | POA: Diagnosis not present

## 2016-03-17 DIAGNOSIS — I10 Essential (primary) hypertension: Secondary | ICD-10-CM | POA: Diagnosis not present

## 2016-03-17 DIAGNOSIS — E663 Overweight: Secondary | ICD-10-CM | POA: Diagnosis not present

## 2016-03-17 DIAGNOSIS — R7309 Other abnormal glucose: Secondary | ICD-10-CM | POA: Diagnosis not present

## 2016-03-17 DIAGNOSIS — Z0000 Encounter for general adult medical examination without abnormal findings: Secondary | ICD-10-CM | POA: Diagnosis not present

## 2016-09-18 DIAGNOSIS — Z23 Encounter for immunization: Secondary | ICD-10-CM | POA: Diagnosis not present

## 2016-10-01 DIAGNOSIS — E663 Overweight: Secondary | ICD-10-CM | POA: Diagnosis not present

## 2016-10-01 DIAGNOSIS — R7309 Other abnormal glucose: Secondary | ICD-10-CM | POA: Diagnosis not present

## 2016-10-01 DIAGNOSIS — Z1389 Encounter for screening for other disorder: Secondary | ICD-10-CM | POA: Diagnosis not present

## 2016-10-01 DIAGNOSIS — E782 Mixed hyperlipidemia: Secondary | ICD-10-CM | POA: Diagnosis not present

## 2016-10-01 DIAGNOSIS — Z6827 Body mass index (BMI) 27.0-27.9, adult: Secondary | ICD-10-CM | POA: Diagnosis not present

## 2016-10-01 DIAGNOSIS — I10 Essential (primary) hypertension: Secondary | ICD-10-CM | POA: Diagnosis not present

## 2017-04-08 DIAGNOSIS — R7309 Other abnormal glucose: Secondary | ICD-10-CM | POA: Diagnosis not present

## 2017-04-08 DIAGNOSIS — E782 Mixed hyperlipidemia: Secondary | ICD-10-CM | POA: Diagnosis not present

## 2017-04-08 DIAGNOSIS — Z6827 Body mass index (BMI) 27.0-27.9, adult: Secondary | ICD-10-CM | POA: Diagnosis not present

## 2017-04-08 DIAGNOSIS — Z1389 Encounter for screening for other disorder: Secondary | ICD-10-CM | POA: Diagnosis not present

## 2017-04-08 DIAGNOSIS — I10 Essential (primary) hypertension: Secondary | ICD-10-CM | POA: Diagnosis not present

## 2017-06-03 DIAGNOSIS — H5200 Hypermetropia, unspecified eye: Secondary | ICD-10-CM | POA: Diagnosis not present

## 2017-06-03 DIAGNOSIS — H40009 Preglaucoma, unspecified, unspecified eye: Secondary | ICD-10-CM | POA: Diagnosis not present

## 2017-06-03 DIAGNOSIS — H2510 Age-related nuclear cataract, unspecified eye: Secondary | ICD-10-CM | POA: Diagnosis not present

## 2017-06-03 DIAGNOSIS — I10 Essential (primary) hypertension: Secondary | ICD-10-CM | POA: Diagnosis not present

## 2017-06-03 DIAGNOSIS — Z0100 Encounter for examination of eyes and vision without abnormal findings: Secondary | ICD-10-CM | POA: Diagnosis not present

## 2017-06-22 ENCOUNTER — Other Ambulatory Visit: Payer: Self-pay

## 2017-06-22 ENCOUNTER — Emergency Department (HOSPITAL_COMMUNITY)
Admission: EM | Admit: 2017-06-22 | Discharge: 2017-06-22 | Disposition: A | Discharge status: Home / Self Care | Payer: Medicare HMO | Attending: Emergency Medicine | Admitting: Emergency Medicine

## 2017-06-22 ENCOUNTER — Emergency Department (HOSPITAL_COMMUNITY): Payer: Medicare HMO

## 2017-06-22 ENCOUNTER — Encounter (HOSPITAL_COMMUNITY): Payer: Self-pay | Admitting: Emergency Medicine

## 2017-06-22 DIAGNOSIS — K529 Noninfective gastroenteritis and colitis, unspecified: Secondary | ICD-10-CM | POA: Diagnosis not present

## 2017-06-22 DIAGNOSIS — I10 Essential (primary) hypertension: Secondary | ICD-10-CM | POA: Insufficient documentation

## 2017-06-22 DIAGNOSIS — Z87891 Personal history of nicotine dependence: Secondary | ICD-10-CM | POA: Insufficient documentation

## 2017-06-22 DIAGNOSIS — Z79899 Other long term (current) drug therapy: Secondary | ICD-10-CM | POA: Diagnosis not present

## 2017-06-22 DIAGNOSIS — R918 Other nonspecific abnormal finding of lung field: Secondary | ICD-10-CM | POA: Diagnosis not present

## 2017-06-22 DIAGNOSIS — R109 Unspecified abdominal pain: Secondary | ICD-10-CM

## 2017-06-22 DIAGNOSIS — R079 Chest pain, unspecified: Secondary | ICD-10-CM | POA: Diagnosis not present

## 2017-06-22 DIAGNOSIS — R1011 Right upper quadrant pain: Secondary | ICD-10-CM | POA: Diagnosis not present

## 2017-06-22 LAB — CBC WITH DIFFERENTIAL/PLATELET
BASOS ABS: 0 10*3/uL (ref 0.0–0.1)
BASOS PCT: 0 %
EOS ABS: 0.1 10*3/uL (ref 0.0–0.7)
EOS PCT: 1 %
HCT: 45.8 % (ref 39.0–52.0)
Hemoglobin: 15.1 g/dL (ref 13.0–17.0)
Lymphocytes Relative: 22 %
Lymphs Abs: 2.5 10*3/uL (ref 0.7–4.0)
MCH: 31.1 pg (ref 26.0–34.0)
MCHC: 33 g/dL (ref 30.0–36.0)
MCV: 94.2 fL (ref 78.0–100.0)
MONO ABS: 0.5 10*3/uL (ref 0.1–1.0)
MONOS PCT: 5 %
Neutro Abs: 8.4 10*3/uL — ABNORMAL HIGH (ref 1.7–7.7)
Neutrophils Relative %: 72 %
PLATELETS: 206 10*3/uL (ref 150–400)
RBC: 4.86 MIL/uL (ref 4.22–5.81)
RDW: 13.4 % (ref 11.5–15.5)
WBC: 11.5 10*3/uL — ABNORMAL HIGH (ref 4.0–10.5)

## 2017-06-22 LAB — I-STAT TROPONIN, ED: Troponin i, poc: 0 ng/mL (ref 0.00–0.08)

## 2017-06-22 LAB — URINALYSIS, ROUTINE W REFLEX MICROSCOPIC
BILIRUBIN URINE: NEGATIVE
Glucose, UA: 150 mg/dL — AB
Hgb urine dipstick: NEGATIVE
Ketones, ur: 5 mg/dL — AB
LEUKOCYTES UA: NEGATIVE
NITRITE: NEGATIVE
PROTEIN: NEGATIVE mg/dL
Specific Gravity, Urine: 1.025 (ref 1.005–1.030)
pH: 5 (ref 5.0–8.0)

## 2017-06-22 LAB — LIPASE, BLOOD: Lipase: 23 U/L (ref 11–51)

## 2017-06-22 LAB — COMPREHENSIVE METABOLIC PANEL
ALT: 42 U/L (ref 17–63)
ANION GAP: 7 (ref 5–15)
AST: 42 U/L — ABNORMAL HIGH (ref 15–41)
Albumin: 4.1 g/dL (ref 3.5–5.0)
Alkaline Phosphatase: 53 U/L (ref 38–126)
BUN: 23 mg/dL — ABNORMAL HIGH (ref 6–20)
CALCIUM: 9.8 mg/dL (ref 8.9–10.3)
CHLORIDE: 106 mmol/L (ref 101–111)
CO2: 28 mmol/L (ref 22–32)
CREATININE: 0.66 mg/dL (ref 0.61–1.24)
Glucose, Bld: 174 mg/dL — ABNORMAL HIGH (ref 65–99)
Potassium: 4.9 mmol/L (ref 3.5–5.1)
SODIUM: 141 mmol/L (ref 135–145)
Total Bilirubin: 0.5 mg/dL (ref 0.3–1.2)
Total Protein: 7.3 g/dL (ref 6.5–8.1)

## 2017-06-22 LAB — D-DIMER, QUANTITATIVE (NOT AT ARMC)

## 2017-06-22 MED ORDER — HYDROMORPHONE HCL 1 MG/ML IJ SOLN
1.0000 mg | Freq: Once | INTRAMUSCULAR | Status: AC
  Administered 2017-06-22: 1 mg via INTRAVENOUS
  Filled 2017-06-22: qty 1

## 2017-06-22 MED ORDER — ONDANSETRON HCL 4 MG/2ML IJ SOLN
4.0000 mg | Freq: Once | INTRAMUSCULAR | Status: AC
  Administered 2017-06-22: 4 mg via INTRAVENOUS
  Filled 2017-06-22: qty 2

## 2017-06-22 MED ORDER — ONDANSETRON 4 MG PO TBDP: 4.0000 mg | ORAL_TABLET | Freq: Three times a day (TID) | ORAL | 0 refills | Status: DC | PRN

## 2017-06-22 MED ORDER — DICYCLOMINE HCL 20 MG PO TABS: 20.0000 mg | ORAL_TABLET | Freq: Two times a day (BID) | ORAL | 0 refills | Status: DC

## 2017-06-22 MED ORDER — IOPAMIDOL (ISOVUE-300) INJECTION 61%
100.0000 mL | Freq: Once | INTRAVENOUS | Status: AC | PRN
  Administered 2017-06-22: 100 mL via INTRAVENOUS

## 2017-06-22 MED ORDER — GLYCOPYRROLATE 0.2 MG/ML IJ SOLN
0.2000 mg | Freq: Once | INTRAMUSCULAR | Status: AC
  Administered 2017-06-22: 0.2 mg via INTRAVENOUS
  Filled 2017-06-22: qty 1

## 2017-06-22 NOTE — ED Provider Notes (Signed)
[facility] DEPT Provider Note   CSN: [id number] Arrival date & time: 06/22/17  0307     History   Chief Complaint Chief Complaint  Patient presents with  . Abdominal Pain    HPI Matthew Payne is a 71 y.o. male.  Patient presents to the emergency part for evaluation of right-sided pain. Patient reports that he was from sleep at midnight with severe cramping pain. Pain is from the right lower area of his chest down into the right upper abdomen area. Pain is constant but has increasing cramps as well. He denies fever, nausea, vomiting, diarrhea.      Past Medical History:  Diagnosis Date  . Hypertension     There are no active problems to display for this patient.   Past Surgical History:  Procedure Laterality Date  . CATARACT EXTRACTION, BILATERAL  5 yrs ago  . COLONOSCOPY  05/28/2011   Procedure: COLONOSCOPY;  Surgeon: [name], MD;  Location: [facility] SUITE;  Service: Endoscopy;  Laterality: N/A;  . CYSTECTOMY     back & leg  . INGUINAL HERNIA REPAIR  07/01/2012   Procedure: HERNIA REPAIR INGUINAL ADULT;  Surgeon: [name], MD;  Location: [facility] ORS;  Service: General;  Laterality: Right;  Right Inguinal Herniorraphy with Mesh  . Nesbit  Plication    . TONSILLECTOMY         Home Medications    Prior to Admission medications   Medication Sig Start Date End Date Taking? Authorizing Provider  Ascorbic Acid (VITAMIN C) 1000 MG tablet Take 1,000 mg by mouth daily.   Yes [provider]  diphenhydramine-acetaminophen (TYLENOL PM) 25-500 MG TABS tablet Take 2 tablets by mouth at bedtime as needed (sleep/pain).   Yes [provider]  enalapril (VASOTEC) 10 MG tablet Take 10 mg by mouth daily.   Yes [provider]  naproxen sodium (ALEVE) 220 MG tablet Take 220 mg by mouth 2 (two) times daily as needed (pain).    Yes [provider]  pravastatin (PRAVACHOL) 40 MG tablet Take 40 mg by mouth daily.   Yes [provider]  dicyclomine (BENTYL) 20 MG tablet Take 1 tablet (20 mg total) by mouth 2 (two) times daily. 06/22/17   Tanna Furry, MD  ondansetron (ZOFRAN ODT) 4 MG disintegrating tablet Take 1 tablet (4 mg total) by mouth every 8 (eight) hours as needed for nausea. 06/22/17   Tanna Furry, MD    Family History No family history on file.  Social History Social History  Substance Use Topics  . Smoking status: Former Smoker    Quit date: 02/24/1978  . Smokeless tobacco: Never Used  . Alcohol use Yes     Allergies   Patient has no known allergies.   Review of Systems Review of Systems  Cardiovascular: Positive for chest pain.  Gastrointestinal: Positive for abdominal pain.  All other systems reviewed and are negative.    Physical Exam Updated Vital Signs BP 139/77   Pulse 91   Temp 98.1 F (36.7 C) (Oral)   Resp 16   Ht 6' (1.829 m)   Wt 87.1 kg (192 lb)   SpO2 98%   BMI 26.04 kg/m   Physical Exam  Constitutional: He is oriented to person, place, and time. He appears well-developed and well-nourished. No distress.  HENT:  Head: Normocephalic and atraumatic.  Right Ear: Hearing normal.  Left Ear: Hearing normal.  Nose: Nose normal.  Mouth/Throat: Oropharynx is clear and moist and  mucous membranes are normal.  Eyes: Pupils are equal, round, and reactive to light. Conjunctivae and EOM are normal.  Neck: Normal range of motion. Neck supple.  Cardiovascular: Regular rhythm, S1 normal and S2 normal.  Exam reveals no gallop and no friction rub.   No murmur heard. Pulmonary/Chest: Effort normal and breath sounds normal. No respiratory distress. He exhibits no tenderness.  Abdominal: Soft. Normal appearance and bowel sounds are normal. There is no hepatosplenomegaly. There is no tenderness. There is no rebound, no guarding, no tenderness at McBurney's point and negative Murphy's sign. No hernia.  Musculoskeletal: Normal range of motion.  Neurological: He is alert and oriented to person,  place, and time. He has normal strength. No cranial nerve deficit or sensory deficit. Coordination normal. GCS eye subscore is 4. GCS verbal subscore is 5. GCS motor subscore is 6.  Skin: Skin is warm, dry and intact. No rash noted. No cyanosis.  Psychiatric: He has a normal mood and affect. His speech is normal and behavior is normal. Thought content normal.  Nursing note and vitals reviewed.    ED Treatments / Results  Labs (all labs ordered are listed, but only abnormal results are displayed) Labs Reviewed  CBC WITH DIFFERENTIAL/PLATELET - Abnormal; Notable for the following:       Result Value   WBC 11.5 (*)    Neutro Abs 8.4 (*)    All other components within normal limits  URINALYSIS, ROUTINE W REFLEX MICROSCOPIC - Abnormal; Notable for the following:    Glucose, UA 150 (*)    Ketones, ur 5 (*)    All other components within normal limits  COMPREHENSIVE METABOLIC PANEL - Abnormal; Notable for the following:    Glucose, Bld 174 (*)    BUN 23 (*)    AST 42 (*)    All other components within normal limits  D-DIMER, QUANTITATIVE (NOT AT [facility])  LIPASE, BLOOD  I-STAT TROPONIN, ED    EKG  EKG Interpretation  Date/Time:  Tuesday June 22 2017 03:34:23 EDT Ventricular Rate:  75 PR Interval:    QRS Duration: 100 QT Interval:  406 QTC Calculation: 454 R Axis:   -42 Text Interpretation:  Sinus rhythm Incomplete RBBB and LAFB Abnormal R-wave progression, early transition No significant change since last tracing Confirmed by Orpah Greek [phone]) on 06/22/2017 4:20:35 AM       Radiology Dg Chest 2 View  Result Date: 06/22/2017 CLINICAL DATA:  Right-sided abdominal pain, onset at midnight EXAM: CHEST  2 VIEW COMPARISON:  12/12/2007 FINDINGS: Shallow inspiration. Minimal linear left base opacities may be atelectatic. No consolidation. No effusions. Normal pulmonary vasculature. Normal heart size. Unremarkable hilar and mediastinal contours. IMPRESSION: Shallow  inspiration with mild atelectatic appearing linear left lung base opacity. Electronically Signed   By: Andreas Newport M.D.   On: 06/22/2017 06:29   Ct Abdomen Pelvis W Contrast  Result Date: 06/22/2017 CLINICAL DATA:  Abdominal pain, right-sided. EXAM: CT ABDOMEN AND PELVIS WITH CONTRAST TECHNIQUE: Multidetector CT imaging of the abdomen and pelvis was performed using the standard protocol following bolus administration of intravenous contrast. CONTRAST:  153mL ISOVUE-300 IOPAMIDOL (ISOVUE-300) INJECTION 61% COMPARISON:  None. FINDINGS: Lower chest:  No acute finding.  Small calcified hilar lymph nodes. Hepatobiliary: No focal liver abnormality. Probable hip steatosis. On reformats there is minimal pericholecystic fluid. No calcified gallstone. Normal common bile duct diameter. Pancreas: Unremarkable. Spleen: No acute finding.  Granulomatous calcifications. Adrenals/Urinary Tract: 11 mm myelolipoma in the left adrenal gland. No hydronephrosis  or stone. 16 mm right renal cyst. Unremarkable bladder. Stomach/Bowel: No obstruction. No appendicitis. Distal colonic diverticulosis. Vascular/Lymphatic: No acute vascular abnormality. No mass or adenopathy. Reproductive:No pathologic findings. Mild for age atherosclerotic calcification. Generalized prostate enlargement. Other: No ascites or pneumoperitoneum. Mild haziness of fat in the small bowel mesentery. No associated adenopathy or mass. Changes of right inguinal hernia repair. Fatty left inguinal hernia. Musculoskeletal: No acute abnormalities. Diffuse thoracolumbar spondylosis with multilevel lower thoracic ankylosis. Remote avulsion injury in the right pubic body. IMPRESSION: 1. Trace fluid around the gallbladder. Suggest ultrasound if symptoms could be biliary. 2. Mild changes of mesenteric panniculitis, age-indeterminate. 3. Fatty left inguinal hernia. Previous right inguinal hernia repair. 4. Prostate enlargement. 5. Colonic diverticulosis. 6. Probable hepatic  steatosis. Electronically Signed   By: Monte Fantasia M.D.   On: 06/22/2017 07:10   US Abdomen Limited Ruq  Result Date: 06/22/2017 CLINICAL DATA:  Right upper quadrant pain for 10 hours. EXAM: ULTRASOUND ABDOMEN LIMITED RIGHT UPPER QUADRANT COMPARISON:  06/22/2017 FINDINGS: Gallbladder: No gallstones or wall thickening visualized. No sonographic Murphy sign noted by sonographer. Common bile duct: Diameter: 2.3 mm Liver: Hepatic steatosis is identified. Focal fatty sparing adjacent to the gallbladder noted. Portal vein is patent on color Doppler imaging with normal direction of blood flow towards the liver. IMPRESSION: 1. No acute findings.  No evidence for gallbladder disease. 2. Hepatic steatosis. Electronically Signed   By: Kerby Moors M.D.   On: 06/22/2017 08:31    Procedures Procedures (including critical care time)  Medications Ordered in ED Medications  HYDROmorphone (DILAUDID) injection 1 mg (1 mg Intravenous Given 06/22/17 0345)  ondansetron (ZOFRAN) injection 4 mg (4 mg Intravenous Given 06/22/17 0345)  iopamidol (ISOVUE-300) 61 % injection 100 mL (100 mLs Intravenous Contrast Given 06/22/17 0621)  glycopyrrolate (ROBINUL) injection 0.2 mg (0.2 mg Intravenous Given 06/22/17 0940)     Initial Impression / Assessment and Plan / ED Course  I have reviewed the triage vital signs and the nursing notes.  Pertinent labs & imaging results that were available during my care of the patient were reviewed by me and considered in my medical decision making (see chart for details).     Patient presented to the emergency para for evaluation of abdominal pain.pain was on the right side of his chest and abdomen. He had no tenderness on examination, however. Workup including chest x-ray which was clear, CT abdomen and pelvis which showed possible edema of the gallbladder wall without gallstones. Ultrasound was recommended. Ultrasound was ordered, case signed out to oncoming ER physician to  follow-up.  Final Clinical Impressions(s) / ED Diagnoses   Final diagnoses:  Abdominal pain, unspecified abdominal location  Gastroenteritis    New Prescriptions Discharge Medication List as of 06/22/2017  9:23 AM    START taking these medications   Details  dicyclomine (BENTYL) 20 MG tablet Take 1 tablet (20 mg total) by mouth 2 (two) times daily., Starting Tue 06/22/2017, Print    ondansetron (ZOFRAN ODT) 4 MG disintegrating tablet Take 1 tablet (4 mg total) by mouth every 8 (eight) hours as needed for nausea., Starting Tue 06/22/2017, Print         Vivan Agostino, Gwenyth Allegra, MD 06/23/17 [phone]

## 2017-06-22 NOTE — ED Notes (Signed)
Pt informed of need for urine sample and given urinal to provide sample when able

## 2017-06-22 NOTE — ED Notes (Signed)
Family at bedside. 

## 2017-06-22 NOTE — ED Notes (Signed)
I-Stat Troponin: 0.00 

## 2017-06-22 NOTE — Discharge Instructions (Signed)
Clear liquids only this morning until symptoms are improving. Bentyl for cramps, Zofran for nausea. Return here with worsening pain, fever, bloody stools, other changes or worsening

## 2017-06-22 NOTE — ED Notes (Signed)
Lab at bedside for repeat blood draw,

## 2017-06-22 NOTE — ED Triage Notes (Signed)
Pt states around midnight he was woken up from sleep with right sided abdominal pain. No N/V/D. NAD.

## 2017-08-13 DIAGNOSIS — Z23 Encounter for immunization: Secondary | ICD-10-CM | POA: Diagnosis not present

## 2017-10-07 DIAGNOSIS — E782 Mixed hyperlipidemia: Secondary | ICD-10-CM | POA: Diagnosis not present

## 2017-10-07 DIAGNOSIS — E663 Overweight: Secondary | ICD-10-CM | POA: Diagnosis not present

## 2017-10-07 DIAGNOSIS — Z125 Encounter for screening for malignant neoplasm of prostate: Secondary | ICD-10-CM | POA: Diagnosis not present

## 2017-10-07 DIAGNOSIS — I10 Essential (primary) hypertension: Secondary | ICD-10-CM | POA: Diagnosis not present

## 2017-10-07 DIAGNOSIS — R7309 Other abnormal glucose: Secondary | ICD-10-CM | POA: Diagnosis not present

## 2017-10-07 DIAGNOSIS — Z6826 Body mass index (BMI) 26.0-26.9, adult: Secondary | ICD-10-CM | POA: Diagnosis not present

## 2017-10-07 DIAGNOSIS — Z1389 Encounter for screening for other disorder: Secondary | ICD-10-CM | POA: Diagnosis not present

## 2017-10-29 DIAGNOSIS — L570 Actinic keratosis: Secondary | ICD-10-CM | POA: Diagnosis not present

## 2017-10-29 DIAGNOSIS — E663 Overweight: Secondary | ICD-10-CM | POA: Diagnosis not present

## 2017-10-29 DIAGNOSIS — E785 Hyperlipidemia, unspecified: Secondary | ICD-10-CM | POA: Diagnosis not present

## 2017-10-29 DIAGNOSIS — Z6826 Body mass index (BMI) 26.0-26.9, adult: Secondary | ICD-10-CM | POA: Diagnosis not present

## 2017-10-29 DIAGNOSIS — I10 Essential (primary) hypertension: Secondary | ICD-10-CM | POA: Diagnosis not present

## 2017-10-29 DIAGNOSIS — Z125 Encounter for screening for malignant neoplasm of prostate: Secondary | ICD-10-CM | POA: Diagnosis not present

## 2017-10-29 DIAGNOSIS — R7309 Other abnormal glucose: Secondary | ICD-10-CM | POA: Diagnosis not present

## 2017-10-29 DIAGNOSIS — B002 Herpesviral gingivostomatitis and pharyngotonsillitis: Secondary | ICD-10-CM | POA: Diagnosis not present

## 2017-10-29 DIAGNOSIS — Z0001 Encounter for general adult medical examination with abnormal findings: Secondary | ICD-10-CM | POA: Diagnosis not present

## 2017-11-18 DIAGNOSIS — C44519 Basal cell carcinoma of skin of other part of trunk: Secondary | ICD-10-CM | POA: Diagnosis not present

## 2017-11-18 DIAGNOSIS — D225 Melanocytic nevi of trunk: Secondary | ICD-10-CM | POA: Diagnosis not present

## 2018-04-15 DIAGNOSIS — I10 Essential (primary) hypertension: Secondary | ICD-10-CM | POA: Diagnosis not present

## 2018-04-15 DIAGNOSIS — E785 Hyperlipidemia, unspecified: Secondary | ICD-10-CM | POA: Diagnosis not present

## 2018-04-15 DIAGNOSIS — E782 Mixed hyperlipidemia: Secondary | ICD-10-CM | POA: Diagnosis not present

## 2018-04-15 DIAGNOSIS — Z1389 Encounter for screening for other disorder: Secondary | ICD-10-CM | POA: Diagnosis not present

## 2018-04-15 DIAGNOSIS — Z6826 Body mass index (BMI) 26.0-26.9, adult: Secondary | ICD-10-CM | POA: Diagnosis not present

## 2018-04-15 DIAGNOSIS — R972 Elevated prostate specific antigen [PSA]: Secondary | ICD-10-CM | POA: Diagnosis not present

## 2018-04-15 DIAGNOSIS — Z125 Encounter for screening for malignant neoplasm of prostate: Secondary | ICD-10-CM | POA: Diagnosis not present

## 2018-04-15 DIAGNOSIS — R7309 Other abnormal glucose: Secondary | ICD-10-CM | POA: Diagnosis not present

## 2018-04-15 DIAGNOSIS — E663 Overweight: Secondary | ICD-10-CM | POA: Diagnosis not present

## 2018-04-25 ENCOUNTER — Inpatient Hospital Stay (HOSPITAL_COMMUNITY)
Admission: EM | Admit: 2018-04-25 | Discharge: 2018-05-02 | DRG: 439 | Disposition: A | Discharge status: Home / Self Care | Payer: Medicare HMO | Attending: Family Medicine | Admitting: Family Medicine

## 2018-04-25 ENCOUNTER — Other Ambulatory Visit: Payer: Self-pay

## 2018-04-25 ENCOUNTER — Emergency Department (HOSPITAL_COMMUNITY): Payer: Medicare HMO

## 2018-04-25 ENCOUNTER — Encounter (HOSPITAL_COMMUNITY): Payer: Self-pay | Admitting: Emergency Medicine

## 2018-04-25 DIAGNOSIS — K8500 Idiopathic acute pancreatitis without necrosis or infection: Secondary | ICD-10-CM | POA: Diagnosis not present

## 2018-04-25 DIAGNOSIS — R1013 Epigastric pain: Secondary | ICD-10-CM | POA: Diagnosis not present

## 2018-04-25 DIAGNOSIS — Z6826 Body mass index (BMI) 26.0-26.9, adult: Secondary | ICD-10-CM | POA: Diagnosis not present

## 2018-04-25 DIAGNOSIS — Z9841 Cataract extraction status, right eye: Secondary | ICD-10-CM | POA: Diagnosis not present

## 2018-04-25 DIAGNOSIS — E663 Overweight: Secondary | ICD-10-CM | POA: Diagnosis not present

## 2018-04-25 DIAGNOSIS — Z800 Family history of malignant neoplasm of digestive organs: Secondary | ICD-10-CM

## 2018-04-25 DIAGNOSIS — F10239 Alcohol dependence with withdrawal, unspecified: Secondary | ICD-10-CM | POA: Diagnosis present

## 2018-04-25 DIAGNOSIS — E1165 Type 2 diabetes mellitus with hyperglycemia: Secondary | ICD-10-CM | POA: Diagnosis present

## 2018-04-25 DIAGNOSIS — R112 Nausea with vomiting, unspecified: Secondary | ICD-10-CM | POA: Diagnosis not present

## 2018-04-25 DIAGNOSIS — R809 Proteinuria, unspecified: Secondary | ICD-10-CM | POA: Diagnosis not present

## 2018-04-25 DIAGNOSIS — K859 Acute pancreatitis without necrosis or infection, unspecified: Secondary | ICD-10-CM | POA: Diagnosis present

## 2018-04-25 DIAGNOSIS — F1020 Alcohol dependence, uncomplicated: Secondary | ICD-10-CM | POA: Diagnosis present

## 2018-04-25 DIAGNOSIS — I110 Hypertensive heart disease with heart failure: Secondary | ICD-10-CM | POA: Diagnosis not present

## 2018-04-25 DIAGNOSIS — K858 Other acute pancreatitis without necrosis or infection: Secondary | ICD-10-CM | POA: Diagnosis not present

## 2018-04-25 DIAGNOSIS — E860 Dehydration: Secondary | ICD-10-CM | POA: Diagnosis present

## 2018-04-25 DIAGNOSIS — Z87891 Personal history of nicotine dependence: Secondary | ICD-10-CM | POA: Diagnosis not present

## 2018-04-25 DIAGNOSIS — R748 Abnormal levels of other serum enzymes: Secondary | ICD-10-CM | POA: Diagnosis not present

## 2018-04-25 DIAGNOSIS — N390 Urinary tract infection, site not specified: Secondary | ICD-10-CM | POA: Diagnosis not present

## 2018-04-25 DIAGNOSIS — R739 Hyperglycemia, unspecified: Secondary | ICD-10-CM | POA: Diagnosis not present

## 2018-04-25 DIAGNOSIS — J90 Pleural effusion, not elsewhere classified: Secondary | ICD-10-CM | POA: Diagnosis not present

## 2018-04-25 DIAGNOSIS — K760 Fatty (change of) liver, not elsewhere classified: Secondary | ICD-10-CM | POA: Diagnosis not present

## 2018-04-25 DIAGNOSIS — K852 Alcohol induced acute pancreatitis without necrosis or infection: Secondary | ICD-10-CM | POA: Diagnosis not present

## 2018-04-25 DIAGNOSIS — K567 Ileus, unspecified: Secondary | ICD-10-CM | POA: Diagnosis not present

## 2018-04-25 DIAGNOSIS — R7989 Other specified abnormal findings of blood chemistry: Secondary | ICD-10-CM | POA: Diagnosis not present

## 2018-04-25 DIAGNOSIS — N400 Enlarged prostate without lower urinary tract symptoms: Secondary | ICD-10-CM | POA: Diagnosis present

## 2018-04-25 DIAGNOSIS — Z9842 Cataract extraction status, left eye: Secondary | ICD-10-CM

## 2018-04-25 DIAGNOSIS — E785 Hyperlipidemia, unspecified: Secondary | ICD-10-CM | POA: Diagnosis present

## 2018-04-25 DIAGNOSIS — D72829 Elevated white blood cell count, unspecified: Secondary | ICD-10-CM | POA: Diagnosis not present

## 2018-04-25 DIAGNOSIS — R140 Abdominal distension (gaseous): Secondary | ICD-10-CM

## 2018-04-25 DIAGNOSIS — I10 Essential (primary) hypertension: Secondary | ICD-10-CM | POA: Diagnosis present

## 2018-04-25 DIAGNOSIS — R109 Unspecified abdominal pain: Secondary | ICD-10-CM | POA: Diagnosis not present

## 2018-04-25 DIAGNOSIS — Z9089 Acquired absence of other organs: Secondary | ICD-10-CM | POA: Diagnosis not present

## 2018-04-25 DIAGNOSIS — R079 Chest pain, unspecified: Secondary | ICD-10-CM | POA: Diagnosis not present

## 2018-04-25 DIAGNOSIS — R1010 Upper abdominal pain, unspecified: Secondary | ICD-10-CM | POA: Diagnosis not present

## 2018-04-25 DIAGNOSIS — R197 Diarrhea, unspecified: Secondary | ICD-10-CM | POA: Diagnosis not present

## 2018-04-25 LAB — COMPREHENSIVE METABOLIC PANEL
ALT: 298 U/L — AB (ref 0–44)
AST: 262 U/L — AB (ref 15–41)
Albumin: 3.7 g/dL (ref 3.5–5.0)
Alkaline Phosphatase: 56 U/L (ref 38–126)
Anion gap: 10 (ref 5–15)
BILIRUBIN TOTAL: 4.2 mg/dL — AB (ref 0.3–1.2)
BUN: 28 mg/dL — AB (ref 8–23)
CO2: 24 mmol/L (ref 22–32)
Calcium: 8.9 mg/dL (ref 8.9–10.3)
Chloride: 105 mmol/L (ref 98–111)
Creatinine, Ser: 0.96 mg/dL (ref 0.61–1.24)
GFR calc Af Amer: >60 mL/min (ref >60)
Glucose, Bld: 224 mg/dL — ABNORMAL HIGH (ref 70–99)
POTASSIUM: 4.3 mmol/L (ref 3.5–5.1)
Sodium: 139 mmol/L (ref 135–145)
TOTAL PROTEIN: 6.4 g/dL — AB (ref 6.5–8.1)

## 2018-04-25 LAB — CBC WITH DIFFERENTIAL/PLATELET
BASOS ABS: 0 10*3/uL (ref 0.0–0.1)
BASOS PCT: 0 %
EOS ABS: 0 10*3/uL (ref 0.0–0.7)
EOS PCT: 0 %
HCT: 48 % (ref 39.0–52.0)
HEMOGLOBIN: 15.8 g/dL (ref 13.0–17.0)
LYMPHS ABS: 0.8 10*3/uL (ref 0.7–4.0)
Lymphocytes Relative: 4 %
MCH: 31 pg (ref 26.0–34.0)
MCHC: 32.9 g/dL (ref 30.0–36.0)
MCV: 94.1 fL (ref 78.0–100.0)
Monocytes Absolute: 1.1 10*3/uL — ABNORMAL HIGH (ref 0.1–1.0)
Monocytes Relative: 6 %
NEUTROS PCT: 90 %
Neutro Abs: 17.8 10*3/uL — ABNORMAL HIGH (ref 1.7–7.7)
PLATELETS: 177 10*3/uL (ref 150–400)
RBC: 5.1 MIL/uL (ref 4.22–5.81)
RDW: 13.3 % (ref 11.5–15.5)
WBC: 19.7 10*3/uL — AB (ref 4.0–10.5)

## 2018-04-25 LAB — GLUCOSE, CAPILLARY
GLUCOSE-CAPILLARY: 196 mg/dL — AB (ref 70–99)
Glucose-Capillary: 141 mg/dL — ABNORMAL HIGH (ref 70–99)

## 2018-04-25 LAB — LIPID PANEL
CHOLESTEROL: 133 mg/dL (ref 0–200)
HDL: 41 mg/dL (ref >40)
LDL CALC: 79 mg/dL (ref 0–99)
TRIGLYCERIDES: 63 mg/dL (ref <150)
Total CHOL/HDL Ratio: 3.2 RATIO
VLDL: 13 mg/dL (ref 0–40)

## 2018-04-25 LAB — D-DIMER, QUANTITATIVE: D-Dimer, Quant: 2.06 ug/mL-FEU — ABNORMAL HIGH (ref 0.00–0.50)

## 2018-04-25 LAB — HEMOGLOBIN A1C
Hgb A1c MFr Bld: 6.5 % — ABNORMAL HIGH (ref 4.8–5.6)
Mean Plasma Glucose: 139.85 mg/dL

## 2018-04-25 LAB — URINALYSIS, ROUTINE W REFLEX MICROSCOPIC
Bacteria, UA: NONE SEEN
GLUCOSE, UA: NEGATIVE mg/dL
Ketones, ur: NEGATIVE mg/dL
LEUKOCYTES UA: NEGATIVE
NITRITE: NEGATIVE
PH: 5 (ref 5.0–8.0)
PROTEIN: 30 mg/dL — AB
SPECIFIC GRAVITY, URINE: 1.017 (ref 1.005–1.030)

## 2018-04-25 LAB — C-REACTIVE PROTEIN: CRP: 5 mg/dL — ABNORMAL HIGH (ref <1.0)

## 2018-04-25 LAB — LIPASE, BLOOD: LIPASE: 1771 U/L — AB (ref 11–51)

## 2018-04-25 LAB — TSH: TSH: 0.632 u[IU]/mL (ref 0.350–4.500)

## 2018-04-25 MED ORDER — FENTANYL CITRATE (PF) 100 MCG/2ML IJ SOLN
50.0000 ug | Freq: Once | INTRAMUSCULAR | Status: AC
  Administered 2018-04-25: 50 ug via INTRAVENOUS
  Filled 2018-04-25: qty 2

## 2018-04-25 MED ORDER — SODIUM CHLORIDE 0.9 % IV BOLUS
1000.0000 mL | Freq: Once | INTRAVENOUS | Status: AC
  Administered 2018-04-25: 1000 mL via INTRAVENOUS

## 2018-04-25 MED ORDER — ACETAMINOPHEN 650 MG RE SUPP: 650.0000 mg | Freq: Four times a day (QID) | RECTAL | Status: DC | PRN

## 2018-04-25 MED ORDER — ENOXAPARIN SODIUM 40 MG/0.4ML ~~LOC~~ SOLN
40.0000 mg | SUBCUTANEOUS | Status: DC
  Administered 2018-04-25 – 2018-05-01 (×7): 40 mg via SUBCUTANEOUS
  Filled 2018-04-25 (×8): qty 0.4

## 2018-04-25 MED ORDER — IOPAMIDOL (ISOVUE-370) INJECTION 76%
100.0000 mL | Freq: Once | INTRAVENOUS | Status: AC | PRN
  Administered 2018-04-25: 100 mL via INTRAVENOUS

## 2018-04-25 MED ORDER — FAMOTIDINE IN NACL 20-0.9 MG/50ML-% IV SOLN
20.0000 mg | Freq: Two times a day (BID) | INTRAVENOUS | Status: DC
  Administered 2018-04-25 – 2018-05-02 (×14): 20 mg via INTRAVENOUS
  Filled 2018-04-25 (×14): qty 50

## 2018-04-25 MED ORDER — SODIUM CHLORIDE 0.9 % IV SOLN
1.0000 g | INTRAVENOUS | Status: DC
  Administered 2018-04-25 – 2018-04-27 (×3): 1 g via INTRAVENOUS
  Filled 2018-04-25 (×3): qty 1

## 2018-04-25 MED ORDER — LORAZEPAM 2 MG/ML IJ SOLN
0.5000 mg | INTRAMUSCULAR | Status: DC | PRN
  Administered 2018-04-25 – 2018-04-27 (×3): 0.5 mg via INTRAVENOUS
  Filled 2018-04-25 (×3): qty 1

## 2018-04-25 MED ORDER — SENNOSIDES-DOCUSATE SODIUM 8.6-50 MG PO TABS: 1.0000 | ORAL_TABLET | Freq: Every evening | ORAL | Status: DC | PRN

## 2018-04-25 MED ORDER — MORPHINE SULFATE (PF) 2 MG/ML IV SOLN
2.0000 mg | INTRAVENOUS | Status: DC | PRN
  Filled 2018-04-25: qty 1

## 2018-04-25 MED ORDER — MORPHINE SULFATE (PF) 2 MG/ML IV SOLN
2.0000 mg | INTRAVENOUS | Status: DC | PRN
  Administered 2018-04-25: 4 mg via INTRAVENOUS
  Administered 2018-04-25 (×2): 2 mg via INTRAVENOUS
  Administered 2018-04-26 – 2018-04-27 (×9): 4 mg via INTRAVENOUS
  Administered 2018-05-01 – 2018-05-02 (×2): 2 mg via INTRAVENOUS
  Filled 2018-04-25: qty 1
  Filled 2018-04-25: qty 2
  Filled 2018-04-25: qty 1
  Filled 2018-04-25 (×5): qty 2
  Filled 2018-04-25: qty 1
  Filled 2018-04-25 (×4): qty 2

## 2018-04-25 MED ORDER — LORAZEPAM 2 MG/ML IJ SOLN: 0.5000 mg | INTRAMUSCULAR | Status: DC | PRN

## 2018-04-25 MED ORDER — INSULIN ASPART 100 UNIT/ML ~~LOC~~ SOLN
0.0000 [IU] | Freq: Three times a day (TID) | SUBCUTANEOUS | Status: DC
  Administered 2018-04-27 – 2018-04-30 (×3): 1 [IU] via SUBCUTANEOUS
  Administered 2018-05-01: 2 [IU] via SUBCUTANEOUS
  Administered 2018-05-01: 1 [IU] via SUBCUTANEOUS
  Administered 2018-05-01: 2 [IU] via SUBCUTANEOUS
  Administered 2018-05-02: 1 [IU] via SUBCUTANEOUS

## 2018-04-25 MED ORDER — ONDANSETRON HCL 4 MG PO TABS: 4.0000 mg | ORAL_TABLET | Freq: Four times a day (QID) | ORAL | Status: DC | PRN

## 2018-04-25 MED ORDER — HYDRALAZINE HCL 20 MG/ML IJ SOLN
10.0000 mg | INTRAMUSCULAR | Status: DC | PRN
  Administered 2018-04-30 – 2018-05-01 (×2): 10 mg via INTRAVENOUS
  Filled 2018-04-25 (×2): qty 1

## 2018-04-25 MED ORDER — ONDANSETRON HCL 4 MG/2ML IJ SOLN
4.0000 mg | Freq: Once | INTRAMUSCULAR | Status: AC
  Administered 2018-04-25: 4 mg via INTRAVENOUS
  Filled 2018-04-25: qty 2

## 2018-04-25 MED ORDER — MORPHINE SULFATE (PF) 4 MG/ML IV SOLN
4.0000 mg | Freq: Once | INTRAVENOUS | Status: AC
  Administered 2018-04-25: 4 mg via INTRAVENOUS
  Filled 2018-04-25: qty 1

## 2018-04-25 MED ORDER — TRAZODONE HCL 50 MG PO TABS
25.0000 mg | ORAL_TABLET | Freq: Every evening | ORAL | Status: DC | PRN
  Administered 2018-05-01: 25 mg via ORAL
  Filled 2018-04-25: qty 1

## 2018-04-25 MED ORDER — ACETAMINOPHEN 325 MG PO TABS
650.0000 mg | ORAL_TABLET | Freq: Four times a day (QID) | ORAL | Status: DC | PRN
  Administered 2018-05-01: 650 mg via ORAL
  Filled 2018-04-25: qty 2

## 2018-04-25 MED ORDER — ONDANSETRON HCL 4 MG/2ML IJ SOLN
4.0000 mg | Freq: Four times a day (QID) | INTRAMUSCULAR | Status: DC | PRN
  Administered 2018-04-27: 4 mg via INTRAVENOUS
  Filled 2018-04-25: qty 2

## 2018-04-25 MED ORDER — INSULIN ASPART 100 UNIT/ML ~~LOC~~ SOLN: 0.0000 [IU] | Freq: Every day | SUBCUTANEOUS | Status: DC

## 2018-04-25 MED ORDER — FAMOTIDINE IN NACL 20-0.9 MG/50ML-% IV SOLN
20.0000 mg | Freq: Once | INTRAVENOUS | Status: AC
  Administered 2018-04-25: 20 mg via INTRAVENOUS
  Filled 2018-04-25: qty 50

## 2018-04-25 MED ORDER — SODIUM CHLORIDE 0.9 % IV SOLN
INTRAVENOUS | Status: DC
  Administered 2018-04-25 – 2018-04-28 (×7): via INTRAVENOUS

## 2018-04-25 MED ORDER — PRAVASTATIN SODIUM 40 MG PO TABS
40.0000 mg | ORAL_TABLET | Freq: Every day | ORAL | Status: DC
  Administered 2018-04-26: 40 mg via ORAL
  Filled 2018-04-25: qty 1

## 2018-04-25 NOTE — H&P (Signed)
History and Physical  ZAHARI XIANG BPZ:[id number] DOB: December 02, 1945 DOA: 04/25/2018  Referring physician: Vanita Panda, MD PCP: Sharilyn Sites, MD   Chief Complaint: abdominal pain  HPI: Matthew Payne is a 72 y.o. male episodic alcohol drinker presented to ED complaining of acute onset severe episodic abdominal pain that started approximately 12 hours prior to arrival.  He has had constant nausea and vomiting since that time and unable to keep down food or drink.  His abdominal pain is epigastric, sharp, non-radiating severe pain that is relieved mostly by not eating or drinking.  The patient has also had multiple loose stools and frequent urinations.  He denies chest pain but does report occasional shortness of breath associated with vomiting.  He reports that he had a similar episode about 1 year ago and he came to ER but was diagnosed with acute gastroenteritis and the symptoms resolved.  A lipase test was normal at that time.  Eating and drinking makes abdominal pain, nausea and vomiting worse and more severe.  He denies fever and chills.    ED Course:  The patient arrived in severe pain.  He was noted to have an elevated d-dimer and WBC. His lipase was 1700.   He had a blood sugar of 224.  His liver enzymes were elevated with an ALT of 298.  He had a leukocytosis with WBC of 19.7  He had a CTA chest abd/pelvis done that did not reveal a PE. There was no aortic dissection. There was marked pancreatitis seen.  The patient had an abnormal urinalysis with 11-20 WBC seen.  The patient is being admitted for further management of acute pancreatitis.    Review of Systems: All systems reviewed and apart from history of presenting illness, are negative. Pt also reports BPH symptoms of diminished urinary flow.   Past Medical History:  Diagnosis Date  . Hypertension    Past Surgical History:  Procedure Laterality Date  . CATARACT EXTRACTION, BILATERAL  5 yrs ago  . COLONOSCOPY  05/28/2011   Procedure:  COLONOSCOPY;  Surgeon: [name], MD;  Location: [facility];  Service: Endoscopy;  Laterality: N/A;  . CYSTECTOMY     back & leg  . INGUINAL HERNIA REPAIR  07/01/2012   Procedure: HERNIA REPAIR INGUINAL ADULT;  Surgeon: [name], MD;  Location: [facility];  Service: General;  Laterality: Right;  Right Inguinal Herniorraphy with Mesh  . Nesbit  Plication    . TONSILLECTOMY     Social History:  reports that he quit smoking about 40 years ago. He has never used smokeless tobacco. He reports that he drinks alcohol. He reports that he does not use drugs.  No Known Allergies  History reviewed. No pertinent family history.  Prior to Admission medications   Medication Sig Start Date End Date Taking? Authorizing Provider  Ascorbic Acid (VITAMIN C) 1000 MG tablet Take 1,000 mg by mouth daily.    [provider]  dicyclomine (BENTYL) 20 MG tablet Take 1 tablet (20 mg total) by mouth 2 (two) times daily. 06/22/17   Tanna Furry, MD  diphenhydramine-acetaminophen (TYLENOL PM) 25-500 MG TABS tablet Take 2 tablets by mouth at bedtime as needed (sleep/pain).    [provider]  enalapril (VASOTEC) 10 MG tablet Take 10 mg by mouth daily.    [provider]  naproxen sodium (ALEVE) 220 MG tablet Take 220 mg by mouth 2 (two) times daily as needed (pain).     [provider]  ondansetron (ZOFRAN ODT) 4 MG disintegrating tablet Take 1 tablet (4 mg total) by mouth every 8 (eight) hours as needed for nausea. 06/22/17   Tanna Furry, MD  pravastatin (PRAVACHOL) 40 MG tablet Take 40 mg by mouth daily.    [provider]   Physical Exam: Vitals:   04/25/18 1208 04/25/18 1422  BP: 122/74 (!) 147/74  Pulse: 71 64  Resp: 18 (!) 24  Temp: (!) 97.5 F (36.4 C)   TempSrc: Oral   SpO2: 100% 99%  Weight: 87.2 kg (192 lb 5 oz)   Height: 6' (1.829 m)     General exam: Moderately built and nourished patient, moderate discomfort noted. Cooperative male.  Head, eyes  and ENT: Nontraumatic and normocephalic. Pupils equally reacting to light and accommodation. Oral mucosa dry.  Neck: Supple. No JVD, carotid bruit or thyromegaly.  Lymphatics: No lymphadenopathy.  Respiratory system: Clear to auscultation. No increased work of breathing.  Cardiovascular system: S1 and S2 heard, RRR. No JVD, murmurs, gallops, clicks or pedal edema.  Gastrointestinal system: Abdomen is nondistended, marked epigastric tenderness with guarding. Normal bowel sounds heard. No organomegaly or masses appreciated.  Central nervous system: Alert and oriented. No focal neurological deficits.  Extremities: Symmetric 5 x 5 power. Peripheral pulses symmetrically felt.   Skin: No rashes or acute findings.  Musculoskeletal system: Negative exam.  Psychiatry: Pleasant and cooperative.  Labs on Admission:  Basic Metabolic Panel: Recent Labs  Lab 04/25/18 1351  NA 139  K 4.3  CL 105  CO2 24  GLUCOSE 224*  BUN 28*  CREATININE 0.96  CALCIUM 8.9   Liver Function Tests: Recent Labs  Lab 04/25/18 1351  AST 262*  ALT 298*  ALKPHOS 56  BILITOT 4.2*  PROT 6.4*  ALBUMIN 3.7   Recent Labs  Lab 04/25/18 1351  LIPASE 1,771*   No results for input(s): AMMONIA in the last 168 hours. CBC: Recent Labs  Lab 04/25/18 1222  WBC 19.7*  NEUTROABS 17.8*  HGB 15.8  HCT 48.0  MCV 94.1  PLT 177   Cardiac Enzymes: No results for input(s): CKTOTAL, CKMB, CKMBINDEX, TROPONINI in the last 168 hours.  BNP (last 3 results) No results for input(s): PROBNP in the last 8760 hours. CBG: No results for input(s): GLUCAP in the last 168 hours.  Radiological Exams on Admission: Dg Abd Acute W/chest  Result Date: 04/25/2018 CLINICAL DATA:  Mid to upper abdominal pain EXAM: DG ABDOMEN ACUTE W/ 1V CHEST COMPARISON:  Abdominal and pelvic CT scan of June 22, 2017 FINDINGS: The lungs are mildly hypoinflated. There is no focal infiltrate. There is no pleural effusion. The heart and  pulmonary vascularity are normal. There is multilevel degenerative disc disease of the thoracic spine. Within the abdomen there are multiple loops of mildly distended gas and fluid-filled small bowel in the mid and right abdomen. The colon is largely collapsed. There is some stool in the descending colon and rectum. The bony structures exhibit no acute abnormalities. IMPRESSION: Bowel gas pattern suggests a small bowel ileus or early partial mid to distal obstruction. No evidence of perforation. No acute cardiopulmonary abnormality. Electronically Signed   By: David  Martinique M.D.   On: 04/25/2018 13:15   Ct Angio Chest/abd/pel For Dissection W And/or Wo Contrast  Result Date: 04/25/2018 CLINICAL DATA:  Abdominal pain which started last night with nausea. Pain persistent since October. Concern for aortic dissection. EXAM: CT ANGIOGRAPHY CHEST, ABDOMEN AND PELVIS TECHNIQUE: Multidetector CT imaging through the chest, abdomen and pelvis  was performed using the standard protocol during bolus administration of intravenous contrast. Multiplanar reconstructed images and MIPs were obtained and reviewed to evaluate the vascular anatomy. CONTRAST:  160mL ISOVUE-370 IOPAMIDOL (ISOVUE-370) INJECTION 76% COMPARISON:  None. FINDINGS: CTA CHEST FINDINGS Cardiovascular: Non IV contrast series demonstrates no intramural hematoma within the thoracic aorta. Contrast enhanced imaging demonstrates no evidence of aortic dissection or aneurysm. No central pulmonary embolism. Mediastinum/Nodes: No axillary or supraclavicular lymphadenopathy. No mediastinal hilar adenopathy. Esophagus normal. Lungs/Pleura: Linear atelectasis at the lung bases. Musculoskeletal: No aggressive osseous lesion Review of the MIP images confirms the above findings. CTA ABDOMEN AND PELVIS FINDINGS VASCULAR Aorta: No aortic dissection or aneurysm. Celiac: Widely patent.  No vascular complication SMA: Widely patent.  No vascular complication Renals: Single renal  arteries are patent. IMA: Patent Inflow: Normal Veins: No Review of the MIP images confirms the above findings. NON-VASCULAR Hepatobiliary: Low-attenuation within liver consistent hepatic steatosis. Postcholecystectomy. No biliary duct dilatation. Common bile duct normal caliber. Pancreas: Extensive mild inflammation involving the head, body and tail ofthe pancreas. There is fluid along the LEFT and RIGHT anterior pararenal fascia in a pattern typical of acute pancreatitis. There is more fluid on the RIGHT than the LEFT. No pancreatic duct dilatation. No organized fluid collections. Spleen: Normal spleen Adrenals/urinary tract: Adrenal glands and kidneys are normal. The ureters and bladder normal. Stomach/Bowel: Stomach is normal. Fluid along the second portion the duodenum presumably related to pancreatitis. The small bowel is normal. Appendix is normal. Colon is collapsed. Vascular/Lymphatic: No periportal or retroperitoneal adenopathy. No pelvic adenopathy. Reproductive: Prostate is enlarged measuring 56 mm in diameter Other: Under Musculoskeletal: No aggressive osseous lesion. Review of the MIP images confirms the above findings. IMPRESSION: Chest Impression: 1. No aortic dissection or aneurysm. 2. Mild linear bibasilar atelectasis. Abdomen / Pelvis Impression: 1. Inflammation and fluid in the upper abdomen in a pattern typical of acute pancreatitis. No organized fluid collections associated with the pancreas. No evidence of pancreas necrosis. 2. Low-attenuation liver consistent with hepatic steatosis. No biliary duct dilatation. 3. No vascular complication associated with acute pancreatitis. No aortic aneurysm or dissection. Electronically Signed   By: Suzy Bouchard M.D.   On: 04/25/2018 15:55   Assessment/Plan Principal Problem:   Acute pancreatitis Active Problems:   Hypertension   Elevated lipase   Dehydration   Alcohol dependence, episodic drinking behavior (HCC)   Elevated d-dimer    Epigastric abdominal pain   Proteinuria   Leukocytosis   UTI (urinary tract infection)   Hyperlipidemia   Hyperglycemia   Abnormal transaminases  1. Acute pancreatitis - Cause is uncertain at this time but suspect it could be from episodic alcohol consumption, will investigate other causes.  Plan to check abdominal US, lipid panel.  Admit to med-surg for treatment with IV fluids, IV pain and nausea medications.  Follow electrolytes closely.  Keep NPO for now except sips with meds. Follow and trend lipase levels.  2. Leukocytosis - likely secondary to acute pancreatitis and UTI. Follow with daily CBC with diff.  3. Hypertension - IV hydralazine ordered as needed.  4. Abnormal transaminases - US abdomen ordered.  5. Dehydration - IV fluids ordered.   6. Hyperglycemia - check A1c, this could be a stress hyperglycemia but need to rule out undiagnosed diabetes mellitus.  7. UTI - culture urine. IV ceftriaxone ordered.  8. Hyperlipidemia - check lipid panel.  9. Alcohol use - Pt strongly advised to stop using alcohol after this episode.  10. Possible BPH - Pt  complains of symptoms of diminished urinary stream/flow, monitor urine output, low threshold to scan bladder.    DVT Prophylaxis: lovenox Code Status: Full   Family Communication: patient   Disposition Plan: Home when medically stabilized   Time spent: 47 minutes  Irwin Brakeman, MD [facility] Pager [phone]  If 7PM-7AM, please contact night-coverage [email or web address] Password [facility] 04/25/2018, 4:28 PM

## 2018-04-25 NOTE — ED Provider Notes (Addendum)
[facility] EMERGENCY DEPARTMENT Provider Note   CSN: [id number] Arrival date & time: 04/25/18  1157     History   Chief Complaint No chief complaint on file.   HPI Matthew Payne is a 72 y.o. male.  HPI Patient presents with concern of abdominal pain, nausea, vomiting. Onset was last night about 12 hours ago, and since onset symptoms have been persistent purulent pain is focally in the epigastrium, upper abdomen midline, nonradiating, sharp, severe, not improved with anything. Patient has multiple episodes of vomiting since onset, as well as some episodes of loose stool. No other chest pain, no asymmetric weakness, nor cold sensation. There is some associated dyspnea. Patient notes one prior similar episode about 1 year ago, without clear diagnosis.  Past Medical History:  Diagnosis Date  . Hypertension     There are no active problems to display for this patient.   Past Surgical History:  Procedure Laterality Date  . CATARACT EXTRACTION, BILATERAL  5 yrs ago  . COLONOSCOPY  05/28/2011   Procedure: COLONOSCOPY;  Surgeon: [name], MD;  Location: [facility];  Service: Endoscopy;  Laterality: N/A;  . CYSTECTOMY     back & leg  . INGUINAL HERNIA REPAIR  07/01/2012   Procedure: HERNIA REPAIR INGUINAL ADULT;  Surgeon: [name], MD;  Location: [facility];  Service: General;  Laterality: Right;  Right Inguinal Herniorraphy with Mesh  . Nesbit  Plication    . TONSILLECTOMY          Home Medications    Prior to Admission medications   Medication Sig Start Date End Date Taking? Authorizing Provider  Ascorbic Acid (VITAMIN C) 1000 MG tablet Take 1,000 mg by mouth daily.    [provider]  dicyclomine (BENTYL) 20 MG tablet Take 1 tablet (20 mg total) by mouth 2 (two) times daily. 06/22/17   Tanna Furry, MD  diphenhydramine-acetaminophen (TYLENOL PM) 25-500 MG TABS tablet Take 2 tablets by mouth at bedtime as needed (sleep/pain).    [provider]   enalapril (VASOTEC) 10 MG tablet Take 10 mg by mouth daily.    [provider]  naproxen sodium (ALEVE) 220 MG tablet Take 220 mg by mouth 2 (two) times daily as needed (pain).     [provider]  ondansetron (ZOFRAN ODT) 4 MG disintegrating tablet Take 1 tablet (4 mg total) by mouth every 8 (eight) hours as needed for nausea. 06/22/17   Tanna Furry, MD  pravastatin (PRAVACHOL) 40 MG tablet Take 40 mg by mouth daily.    [provider]    Family History History reviewed. No pertinent family history.  Social History Social History   Tobacco Use  . Smoking status: Former Smoker    Last attempt to quit: 02/24/1978    Years since quitting: 40.1  . Smokeless tobacco: Never Used  Substance Use Topics  . Alcohol use: Yes  . Drug use: No   Drinks 4-5 beers every couple of nights.  Allergies   Patient has no known allergies.   Review of Systems Review of Systems  Constitutional:       Per HPI, otherwise negative  HENT:       Per HPI, otherwise negative  Respiratory:       Per HPI, otherwise negative  Cardiovascular:       Per HPI, otherwise negative  Gastrointestinal: Negative for vomiting.  Endocrine:       Negative aside from HPI  Genitourinary:  Neg aside from HPI   Musculoskeletal:       Per HPI, otherwise negative  Skin: Negative.   Neurological: Negative for syncope.     Physical Exam Updated Vital Signs BP (!) 147/74   Pulse 64   Temp (!) 97.5 F (36.4 C) (Oral)   Resp (!) 24   Ht 6' (1.829 m)   Wt 87.2 kg (192 lb 5 oz)   SpO2 99%   BMI 26.08 kg/m   Physical Exam  Constitutional: He is oriented to person, place, and time. He appears well-developed. No distress.  Anxious adult male walking about the room stating that he cannot get comfortable.  HENT:  Head: Normocephalic and atraumatic.  Eyes: Conjunctivae and EOM are normal.  Cardiovascular: Normal rate and regular rhythm.  Pulmonary/Chest: Effort normal. No stridor. No  respiratory distress.  Abdominal: He exhibits no distension. There is tenderness. There is guarding.    Musculoskeletal: He exhibits no edema.  Neurological: He is alert and oriented to person, place, and time.  Skin: Skin is warm and dry.  Psychiatric: He has a normal mood and affect.  Nursing note and vitals reviewed.    ED Treatments / Results  Labs (all labs ordered are listed, but only abnormal results are displayed) Labs Reviewed  CBC WITH DIFFERENTIAL/PLATELET - Abnormal; Notable for the following components:      Result Value   WBC 19.7 (*)    Neutro Abs 17.8 (*)    Monocytes Absolute 1.1 (*)    All other components within normal limits  URINALYSIS, ROUTINE W REFLEX MICROSCOPIC - Abnormal; Notable for the following components:   Color, Urine AMBER (*)    APPearance HAZY (*)    Hgb urine dipstick SMALL (*)    Bilirubin Urine SMALL (*)    Protein, ur 30 (*)    Non Squamous Epithelial 0-5 (*)    All other components within normal limits  D-DIMER, QUANTITATIVE (NOT AT [facility]) - Abnormal; Notable for the following components:   D-Dimer, Quant 2.06 (*)    All other components within normal limits  COMPREHENSIVE METABOLIC PANEL - Abnormal; Notable for the following components:   Glucose, Bld 224 (*)    BUN 28 (*)    Total Protein 6.4 (*)    AST 262 (*)    ALT 298 (*)    Total Bilirubin 4.2 (*)    All other components within normal limits  LIPASE, BLOOD - Abnormal; Notable for the following components:   Lipase 1,771 (*)    All other components within normal limits    EKG EKG Interpretation  Date/Time:  Monday April 25 2018 13:27:49 EDT Ventricular Rate:  75 PR Interval:    QRS Duration: 101 QT Interval:  409 QTC Calculation: 457 R Axis:   -71 Text Interpretation:  Sinus rhythm Left anterior fascicular block Abnormal R-wave progression, late transition Baseline wander in lead(s) V1 V2 Abnormal ekg Confirmed by Carmin Muskrat [phone]) on 04/25/2018 1:30:23  PM   Radiology Dg Abd Acute W/chest  Result Date: 04/25/2018 CLINICAL DATA:  Mid to upper abdominal pain EXAM: DG ABDOMEN ACUTE W/ 1V CHEST COMPARISON:  Abdominal and pelvic CT scan of June 22, 2017 FINDINGS: The lungs are mildly hypoinflated. There is no focal infiltrate. There is no pleural effusion. The heart and pulmonary vascularity are normal. There is multilevel degenerative disc disease of the thoracic spine. Within the abdomen there are multiple loops of mildly distended gas and fluid-filled small bowel in the mid and  right abdomen. The colon is largely collapsed. There is some stool in the descending colon and rectum. The bony structures exhibit no acute abnormalities. IMPRESSION: Bowel gas pattern suggests a small bowel ileus or early partial mid to distal obstruction. No evidence of perforation. No acute cardiopulmonary abnormality. Electronically Signed   By: David  Martinique M.D.   On: 04/25/2018 13:15   Ct Angio Chest/abd/pel For Dissection W And/or Wo Contrast  Result Date: 04/25/2018 CLINICAL DATA:  Abdominal pain which started last night with nausea. Pain persistent since October. Concern for aortic dissection. EXAM: CT ANGIOGRAPHY CHEST, ABDOMEN AND PELVIS TECHNIQUE: Multidetector CT imaging through the chest, abdomen and pelvis was performed using the standard protocol during bolus administration of intravenous contrast. Multiplanar reconstructed images and MIPs were obtained and reviewed to evaluate the vascular anatomy. CONTRAST:  147mL ISOVUE-370 IOPAMIDOL (ISOVUE-370) INJECTION 76% COMPARISON:  None. FINDINGS: CTA CHEST FINDINGS Cardiovascular: Non IV contrast series demonstrates no intramural hematoma within the thoracic aorta. Contrast enhanced imaging demonstrates no evidence of aortic dissection or aneurysm. No central pulmonary embolism. Mediastinum/Nodes: No axillary or supraclavicular lymphadenopathy. No mediastinal hilar adenopathy. Esophagus normal. Lungs/Pleura: Linear  atelectasis at the lung bases. Musculoskeletal: No aggressive osseous lesion Review of the MIP images confirms the above findings. CTA ABDOMEN AND PELVIS FINDINGS VASCULAR Aorta: No aortic dissection or aneurysm. Celiac: Widely patent.  No vascular complication SMA: Widely patent.  No vascular complication Renals: Single renal arteries are patent. IMA: Patent Inflow: Normal Veins: No Review of the MIP images confirms the above findings. NON-VASCULAR Hepatobiliary: Low-attenuation within liver consistent hepatic steatosis. Postcholecystectomy. No biliary duct dilatation. Common bile duct normal caliber. Pancreas: Extensive mild inflammation involving the head, body and tail ofthe pancreas. There is fluid along the LEFT and RIGHT anterior pararenal fascia in a pattern typical of acute pancreatitis. There is more fluid on the RIGHT than the LEFT. No pancreatic duct dilatation. No organized fluid collections. Spleen: Normal spleen Adrenals/urinary tract: Adrenal glands and kidneys are normal. The ureters and bladder normal. Stomach/Bowel: Stomach is normal. Fluid along the second portion the duodenum presumably related to pancreatitis. The small bowel is normal. Appendix is normal. Colon is collapsed. Vascular/Lymphatic: No periportal or retroperitoneal adenopathy. No pelvic adenopathy. Reproductive: Prostate is enlarged measuring 56 mm in diameter Other: Under Musculoskeletal: No aggressive osseous lesion. Review of the MIP images confirms the above findings. IMPRESSION: Chest Impression: 1. No aortic dissection or aneurysm. 2. Mild linear bibasilar atelectasis. Abdomen / Pelvis Impression: 1. Inflammation and fluid in the upper abdomen in a pattern typical of acute pancreatitis. No organized fluid collections associated with the pancreas. No evidence of pancreas necrosis. 2. Low-attenuation liver consistent with hepatic steatosis. No biliary duct dilatation. 3. No vascular complication associated with acute  pancreatitis. No aortic aneurysm or dissection. Electronically Signed   By: Suzy Bouchard M.D.   On: 04/25/2018 15:55    Procedures Procedures (including critical care time)  Medications Ordered in ED Medications  sodium chloride 0.9 % bolus 1,000 mL (has no administration in time range)  sodium chloride 0.9 % bolus 1,000 mL (0 mLs Intravenous Stopped 04/25/18 1424)  ondansetron (ZOFRAN) injection 4 mg (4 mg Intravenous Given 04/25/18 1248)  fentaNYL (SUBLIMAZE) injection 50 mcg (50 mcg Intravenous Given 04/25/18 1248)  famotidine (PEPCID) IVPB 20 mg premix (0 mg Intravenous Stopped 04/25/18 1337)  morphine 4 MG/ML injection 4 mg (4 mg Intravenous Given 04/25/18 1458)  iopamidol (ISOVUE-370) 76 % injection 100 mL (100 mLs Intravenous Contrast Given 04/25/18 1529)  Initial Impression / Assessment and Plan / ED Course  I have reviewed the triage vital signs and the nursing notes.  Pertinent labs & imaging results that were available during my care of the patient were reviewed by me and considered in my medical decision making (see chart for details).     1:52 PM Patient feeling better. Initial labs notable for leukocytosis and elevated d-dimer. Patient awaiting CT scan.  3:59 PM Patient CT scan consistent with acute pancreatitis, and given the late arriving result of elevated lipase, 1771, some evidence for hepatic dysfunction, patient will require admission for acute pancreatitis. Patient has received fluids, analgesia, has had improvement in his pain, remains hemodynamically awake and alert, unremarkable.  Final Clinical Impressions(s) / ED Diagnoses  Acute pancreatitis   Carmin Muskrat, MD 04/25/18 1559    Carmin Muskrat, MD 04/25/18 [id number]

## 2018-04-25 NOTE — ED Triage Notes (Signed)
PT c/o upper abdominal pain with n/v/d that started last night.

## 2018-04-26 ENCOUNTER — Inpatient Hospital Stay (HOSPITAL_COMMUNITY): Payer: Medicare HMO

## 2018-04-26 DIAGNOSIS — K852 Alcohol induced acute pancreatitis without necrosis or infection: Principal | ICD-10-CM

## 2018-04-26 LAB — CBC WITH DIFFERENTIAL/PLATELET
BASOS ABS: 0 10*3/uL (ref 0.0–0.1)
Basophils Relative: 0 %
EOS ABS: 0 10*3/uL (ref 0.0–0.7)
Eosinophils Relative: 0 %
HCT: 45.3 % (ref 39.0–52.0)
HEMOGLOBIN: 14.7 g/dL (ref 13.0–17.0)
LYMPHS ABS: 1.1 10*3/uL (ref 0.7–4.0)
LYMPHS PCT: 7 %
MCH: 30.9 pg (ref 26.0–34.0)
MCHC: 32.5 g/dL (ref 30.0–36.0)
MCV: 95.2 fL (ref 78.0–100.0)
Monocytes Absolute: 1 10*3/uL (ref 0.1–1.0)
Monocytes Relative: 6 %
NEUTROS PCT: 87 %
Neutro Abs: 14.2 10*3/uL — ABNORMAL HIGH (ref 1.7–7.7)
Platelets: 152 10*3/uL (ref 150–400)
RBC: 4.76 MIL/uL (ref 4.22–5.81)
RDW: 13.6 % (ref 11.5–15.5)
WBC: 16.3 10*3/uL — AB (ref 4.0–10.5)

## 2018-04-26 LAB — GLUCOSE, CAPILLARY
GLUCOSE-CAPILLARY: 118 mg/dL — AB (ref 70–99)
GLUCOSE-CAPILLARY: 109 mg/dL — AB (ref 70–99)
Glucose-Capillary: 131 mg/dL — ABNORMAL HIGH (ref 70–99)
Glucose-Capillary: 92 mg/dL (ref 70–99)

## 2018-04-26 LAB — COMPREHENSIVE METABOLIC PANEL
ALT: 249 U/L — ABNORMAL HIGH (ref 0–44)
ANION GAP: 6 (ref 5–15)
AST: 137 U/L — ABNORMAL HIGH (ref 15–41)
Albumin: 3.6 g/dL (ref 3.5–5.0)
Alkaline Phosphatase: 64 U/L (ref 38–126)
BUN: 22 mg/dL (ref 8–23)
CHLORIDE: 109 mmol/L (ref 98–111)
CO2: 29 mmol/L (ref 22–32)
Calcium: 8.8 mg/dL — ABNORMAL LOW (ref 8.9–10.3)
Creatinine, Ser: 0.64 mg/dL (ref 0.61–1.24)
GFR calc Af Amer: >60 mL/min (ref >60)
GFR calc non Af Amer: >60 mL/min (ref >60)
Glucose, Bld: 135 mg/dL — ABNORMAL HIGH (ref 70–99)
Potassium: 4.8 mmol/L (ref 3.5–5.1)
SODIUM: 144 mmol/L (ref 135–145)
Total Bilirubin: 4.9 mg/dL — ABNORMAL HIGH (ref 0.3–1.2)
Total Protein: 6.4 g/dL — ABNORMAL LOW (ref 6.5–8.1)

## 2018-04-26 LAB — VITAMIN D 25 HYDROXY (VIT D DEFICIENCY, FRACTURES): Vit D, 25-Hydroxy: 33.1 ng/mL (ref 30.0–100.0)

## 2018-04-26 LAB — LIPID PANEL
CHOL/HDL RATIO: 3.3 ratio
CHOLESTEROL: 118 mg/dL (ref 0–200)
HDL: 36 mg/dL — AB (ref >40)
LDL Cholesterol: 69 mg/dL (ref 0–99)
Triglycerides: 64 mg/dL (ref <150)
VLDL: 13 mg/dL (ref 0–40)

## 2018-04-26 LAB — PROTIME-INR
INR: 1.16
PROTHROMBIN TIME: 14.7 s (ref 11.4–15.2)

## 2018-04-26 LAB — MAGNESIUM: Magnesium: 1.8 mg/dL (ref 1.7–2.4)

## 2018-04-26 LAB — HIV ANTIBODY (ROUTINE TESTING W REFLEX): HIV SCREEN 4TH GENERATION: NONREACTIVE

## 2018-04-26 LAB — LIPASE, BLOOD: Lipase: 825 U/L — ABNORMAL HIGH (ref 11–51)

## 2018-04-26 NOTE — Progress Notes (Signed)
PROGRESS NOTE    Matthew Payne  BJY:[id number] DOB: 1946/07/23 DOA: 04/25/2018 PCP: Sharilyn Sites, MD    Brief Narrative:  72 year old male with a history of alcohol use, presents with abdominal pain.  Found to have evidence of pancreatitis.  Admitted for supportive management with IV fluids, pain management.  Also noted to have significantly elevated LFTs.  Further work-up in process.   Assessment & Plan:   Principal Problem:   Acute pancreatitis Active Problems:   Hypertension   Elevated lipase   Dehydration   Alcohol dependence, episodic drinking behavior (HCC)   Elevated d-dimer   Epigastric abdominal pain   Proteinuria   Leukocytosis   UTI (urinary tract infection)   Hyperlipidemia   Hyperglycemia   Abnormal transaminases   1. Acute alcoholic pancreatitis.  Patient admits to drinking alcohol regularly.  LFTs are noted to be elevated, but bile duct does not appear to be dilated.  Gallbladder was not visualized on imaging.  Overall lipase is trending down.  Continue to treat supportively with bowel rest, IV fluids and pain management. 2. Elevated LFTs.  Bile duct is not dilated.  Gallbladder is not clearly visualized.  MRCP has been ordered.  Hepatitis panel has been requested. 3. UTI.  Currently on intravenous ceftriaxone.  Follow-up culture. 4. Alcohol abuse.  On protocol. CIWA 5. Hypertension.  IV hydralazine as needed. 6. Hyperlipidemia.  Statin currently on hold due to elevated LFTs.   DVT prophylaxis: Lovenox Code Status: Full code Family Communication: No family present discharge home once improved Disposition Plan: Discharge home once improved   Consultants:     Procedures:     Antimicrobials:   Ceftriaxone 7/8 >   Subjective: Continues to have abdominal pain.  Pain medication does help, but when this wears off, he continues to have abdominal pain.  No vomiting.  Objective: Vitals:   04/26/18 0855 04/26/18 1425 04/26/18 1523 04/26/18 1928    BP:  (!) 158/86  (!) 176/88  Pulse:  93  (!) 102  Resp:  18  16  Temp:   97.8 F (36.6 C)   TempSrc:   Oral   SpO2: 93% 94%  93%  Weight:      Height:        Intake/Output Summary (Last 24 hours) at 04/26/2018 2016 Last data filed at 04/26/2018 1500 Gross per 24 hour  Intake 2335 ml  Output 150 ml  Net 2185 ml   Filed Weights   04/25/18 1208 04/25/18 1702  Weight: 87.2 kg (192 lb 5 oz) 88.9 kg (196 lb)    Examination:  General exam: Appears calm and comfortable  Respiratory system: Clear to auscultation. Respiratory effort normal. Cardiovascular system: S1 & S2 heard, RRR. No JVD, murmurs, rubs, gallops or clicks. No pedal edema. Gastrointestinal system: Abdomen is nondistended, soft and diffusely tender. No organomegaly or masses felt. Normal bowel sounds heard. Central nervous system: Alert and oriented. No focal neurological deficits. Extremities: Symmetric 5 x 5 power. Skin: No rashes, lesions or ulcers Psychiatry: Judgement and insight appear normal. Mood & affect appropriate.     Data Reviewed: I have personally reviewed following labs and imaging studies  CBC: Recent Labs  Lab 04/25/18 1222 04/26/18 0606  WBC 19.7* 16.3*  NEUTROABS 17.8* 14.2*  HGB 15.8 14.7  HCT 48.0 45.3  MCV 94.1 95.2  PLT 177 086   Basic Metabolic Panel: Recent Labs  Lab 04/25/18 1351 04/26/18 0606  NA 139 144  K 4.3 4.8  CL 105 109  CO2 24 29  GLUCOSE 224* 135*  BUN 28* 22  CREATININE 0.96 0.64  CALCIUM 8.9 8.8*  MG  --  1.8   GFR: Estimated Creatinine Clearance: 94.3 mL/min (by C-G formula based on SCr of 0.64 mg/dL). Liver Function Tests: Recent Labs  Lab 04/25/18 1351 04/26/18 0606  AST 262* 137*  ALT 298* 249*  ALKPHOS 56 64  BILITOT 4.2* 4.9*  PROT 6.4* 6.4*  ALBUMIN 3.7 3.6   Recent Labs  Lab 04/25/18 1351 04/26/18 0606  LIPASE 1,771* 825*   No results for input(s): AMMONIA in the last 168 hours. Coagulation Profile: Recent Labs  Lab  04/26/18 1719  INR 1.16   Cardiac Enzymes: No results for input(s): CKTOTAL, CKMB, CKMBINDEX, TROPONINI in the last 168 hours. BNP (last 3 results) No results for input(s): PROBNP in the last 8760 hours. HbA1C: Recent Labs    04/25/18 1624  HGBA1C 6.5*   CBG: Recent Labs  Lab 04/25/18 2142 04/26/18 0740 04/26/18 1145 04/26/18 1654 04/26/18 1930  GLUCAP 141* 118* 131* 109* 92   Lipid Profile: Recent Labs    04/25/18 1351 04/26/18 0606  CHOL 133 118  HDL 41 36*  LDLCALC 79 69  TRIG 63 64  CHOLHDL 3.2 3.3   Thyroid Function Tests: Recent Labs    04/25/18 1624  TSH 0.632   Anemia Panel: No results for input(s): VITAMINB12, FOLATE, FERRITIN, TIBC, IRON, RETICCTPCT in the last 72 hours. Sepsis Labs: No results for input(s): PROCALCITON, LATICACIDVEN in the last 168 hours.  No results found for this or any previous visit (from the past 240 hour(s)).       Radiology Studies: Dg Abd Acute W/chest  Result Date: 04/25/2018 CLINICAL DATA:  Mid to upper abdominal pain EXAM: DG ABDOMEN ACUTE W/ 1V CHEST COMPARISON:  Abdominal and pelvic CT scan of June 22, 2017 FINDINGS: The lungs are mildly hypoinflated. There is no focal infiltrate. There is no pleural effusion. The heart and pulmonary vascularity are normal. There is multilevel degenerative disc disease of the thoracic spine. Within the abdomen there are multiple loops of mildly distended gas and fluid-filled small bowel in the mid and right abdomen. The colon is largely collapsed. There is some stool in the descending colon and rectum. The bony structures exhibit no acute abnormalities. IMPRESSION: Bowel gas pattern suggests a small bowel ileus or early partial mid to distal obstruction. No evidence of perforation. No acute cardiopulmonary abnormality. Electronically Signed   By: David  Martinique M.D.   On: 04/25/2018 13:15   Ct Angio Chest/abd/pel For Dissection W And/or Wo Contrast  Result Date: 04/25/2018 CLINICAL  DATA:  Abdominal pain which started last night with nausea. Pain persistent since October. Concern for aortic dissection. EXAM: CT ANGIOGRAPHY CHEST, ABDOMEN AND PELVIS TECHNIQUE: Multidetector CT imaging through the chest, abdomen and pelvis was performed using the standard protocol during bolus administration of intravenous contrast. Multiplanar reconstructed images and MIPs were obtained and reviewed to evaluate the vascular anatomy. CONTRAST:  132mL ISOVUE-370 IOPAMIDOL (ISOVUE-370) INJECTION 76% COMPARISON:  None. FINDINGS: CTA CHEST FINDINGS Cardiovascular: Non IV contrast series demonstrates no intramural hematoma within the thoracic aorta. Contrast enhanced imaging demonstrates no evidence of aortic dissection or aneurysm. No central pulmonary embolism. Mediastinum/Nodes: No axillary or supraclavicular lymphadenopathy. No mediastinal hilar adenopathy. Esophagus normal. Lungs/Pleura: Linear atelectasis at the lung bases. Musculoskeletal: No aggressive osseous lesion Review of the MIP images confirms the above findings. CTA ABDOMEN AND PELVIS FINDINGS VASCULAR Aorta: No aortic dissection or aneurysm. Celiac: Widely  patent.  No vascular complication SMA: Widely patent.  No vascular complication Renals: Single renal arteries are patent. IMA: Patent Inflow: Normal Veins: No Review of the MIP images confirms the above findings. NON-VASCULAR Hepatobiliary: Low-attenuation within liver consistent hepatic steatosis. Postcholecystectomy. No biliary duct dilatation. Common bile duct normal caliber. Pancreas: Extensive mild inflammation involving the head, body and tail ofthe pancreas. There is fluid along the LEFT and RIGHT anterior pararenal fascia in a pattern typical of acute pancreatitis. There is more fluid on the RIGHT than the LEFT. No pancreatic duct dilatation. No organized fluid collections. Spleen: Normal spleen Adrenals/urinary tract: Adrenal glands and kidneys are normal. The ureters and bladder normal.  Stomach/Bowel: Stomach is normal. Fluid along the second portion the duodenum presumably related to pancreatitis. The small bowel is normal. Appendix is normal. Colon is collapsed. Vascular/Lymphatic: No periportal or retroperitoneal adenopathy. No pelvic adenopathy. Reproductive: Prostate is enlarged measuring 56 mm in diameter Other: Under Musculoskeletal: No aggressive osseous lesion. Review of the MIP images confirms the above findings. IMPRESSION: Chest Impression: 1. No aortic dissection or aneurysm. 2. Mild linear bibasilar atelectasis. Abdomen / Pelvis Impression: 1. Inflammation and fluid in the upper abdomen in a pattern typical of acute pancreatitis. No organized fluid collections associated with the pancreas. No evidence of pancreas necrosis. 2. Low-attenuation liver consistent with hepatic steatosis. No biliary duct dilatation. 3. No vascular complication associated with acute pancreatitis. No aortic aneurysm or dissection. Electronically Signed   By: Suzy Bouchard M.D.   On: 04/25/2018 15:55   US Abdomen Limited Ruq  Result Date: 04/26/2018 CLINICAL DATA:  Acute pancreatitis, epigastric pain for 2 days EXAM: ULTRASOUND ABDOMEN LIMITED RIGHT UPPER QUADRANT COMPARISON:  CT abdomen and pelvis of 04/25/2017 FINDINGS: Gallbladder: The gallbladder is not definitely visualized. In review of yesterday CT there is no evidence by surgical clips of prior cholecystectomy the gallbladder could be significantly contracted. No calcified gallstones are evident. Common bile duct: Diameter: The common bile duct is normal measuring 2.4 mm. Liver: The liver parenchyma is echogenic and inhomogeneous consistent with hepatic steatosis. No focal hepatic abnormality is seen. Portal vein flow is patent and in the normal direction. IMPRESSION: 1. Echogenic and inhomogeneous liver parenchyma consistent with hepatic steatosis. 2. The gallbladder is not visualized and may be either significantly contracted or previously  resected. Correlate with surgical history. Electronically Signed   By: Ivar Drape M.D.   On: 04/26/2018 10:59        Scheduled Meds: . enoxaparin (LOVENOX) injection  40 mg Subcutaneous Q24H  . insulin aspart  0-5 Units Subcutaneous QHS  . insulin aspart  0-9 Units Subcutaneous TID WC  . pravastatin  40 mg Oral Daily   Continuous Infusions: . sodium chloride 100 mL/hr at 04/26/18 0439  . cefTRIAXone (ROCEPHIN)  IV Stopped (04/26/18 1811)  . famotidine (PEPCID) IV Stopped (04/26/18 0912)     LOS: 1 day    Time spent: 30 minutes    Kathie Dike, MD [facility] Pager [phone]  If 7PM-7AM, please contact night-coverage [email or web address] Password [email or web address] 04/26/2018, 8:16 PM

## 2018-04-27 ENCOUNTER — Inpatient Hospital Stay (HOSPITAL_COMMUNITY): Payer: Medicare HMO

## 2018-04-27 DIAGNOSIS — K859 Acute pancreatitis without necrosis or infection, unspecified: Secondary | ICD-10-CM

## 2018-04-27 DIAGNOSIS — R748 Abnormal levels of other serum enzymes: Secondary | ICD-10-CM

## 2018-04-27 LAB — MAGNESIUM: Magnesium: 1.9 mg/dL (ref 1.7–2.4)

## 2018-04-27 LAB — URINE CULTURE: Culture: NO GROWTH

## 2018-04-27 LAB — CBC WITH DIFFERENTIAL/PLATELET
Basophils Absolute: 0 10*3/uL (ref 0.0–0.1)
Basophils Relative: 0 %
EOS PCT: 0 %
Eosinophils Absolute: 0 10*3/uL (ref 0.0–0.7)
HEMATOCRIT: 42.4 % (ref 39.0–52.0)
Hemoglobin: 13.5 g/dL (ref 13.0–17.0)
LYMPHS ABS: 1 10*3/uL (ref 0.7–4.0)
LYMPHS PCT: 6 %
MCH: 30.7 pg (ref 26.0–34.0)
MCHC: 31.8 g/dL (ref 30.0–36.0)
MCV: 96.4 fL (ref 78.0–100.0)
MONO ABS: 1.3 10*3/uL — AB (ref 0.1–1.0)
Monocytes Relative: 8 %
NEUTROS ABS: 14.4 10*3/uL — AB (ref 1.7–7.7)
Neutrophils Relative %: 86 %
PLATELETS: 140 10*3/uL — AB (ref 150–400)
RBC: 4.4 MIL/uL (ref 4.22–5.81)
RDW: 14 % (ref 11.5–15.5)
WBC: 16.8 10*3/uL — ABNORMAL HIGH (ref 4.0–10.5)

## 2018-04-27 LAB — GLUCOSE, CAPILLARY
GLUCOSE-CAPILLARY: 119 mg/dL — AB (ref 70–99)
Glucose-Capillary: 130 mg/dL — ABNORMAL HIGH (ref 70–99)
Glucose-Capillary: 98 mg/dL (ref 70–99)
Glucose-Capillary: 80 mg/dL (ref 70–99)

## 2018-04-27 LAB — HEPATITIS PANEL, ACUTE
HCV Ab: <0.1 s/co ratio (ref 0.0–0.9)
Hep A IgM: NEGATIVE
Hep B C IgM: NEGATIVE
Hepatitis B Surface Ag: NEGATIVE

## 2018-04-27 LAB — COMPREHENSIVE METABOLIC PANEL
ALBUMIN: 3.4 g/dL — AB (ref 3.5–5.0)
ALT: 155 U/L — ABNORMAL HIGH (ref 0–44)
AST: 52 U/L — AB (ref 15–41)
Alkaline Phosphatase: 56 U/L (ref 38–126)
Anion gap: 8 (ref 5–15)
BUN: 25 mg/dL — AB (ref 8–23)
CHLORIDE: 109 mmol/L (ref 98–111)
CO2: 27 mmol/L (ref 22–32)
CREATININE: 0.63 mg/dL (ref 0.61–1.24)
Calcium: 8.6 mg/dL — ABNORMAL LOW (ref 8.9–10.3)
GFR calc Af Amer: >60 mL/min (ref >60)
GFR calc non Af Amer: >60 mL/min (ref >60)
GLUCOSE: 148 mg/dL — AB (ref 70–99)
Potassium: 4.4 mmol/L (ref 3.5–5.1)
SODIUM: 144 mmol/L (ref 135–145)
Total Bilirubin: 2.4 mg/dL — ABNORMAL HIGH (ref 0.3–1.2)
Total Protein: 6.5 g/dL (ref 6.5–8.1)

## 2018-04-27 LAB — TRIGLYCERIDES: TRIGLYCERIDES: 87 mg/dL (ref <150)

## 2018-04-27 LAB — LIPASE, BLOOD: Lipase: 144 U/L — ABNORMAL HIGH (ref 11–51)

## 2018-04-27 MED ORDER — SORBITOL 70 % SOLN
960.0000 mL | TOPICAL_OIL | Freq: Once | ORAL | Status: AC
  Administered 2018-04-27: 960 mL via RECTAL
  Filled 2018-04-27: qty 473

## 2018-04-27 NOTE — Progress Notes (Signed)
Enema per order given with moderate results brown stool as reported by patient. Pt expelled significant amount of gas with the enema.

## 2018-04-27 NOTE — Progress Notes (Signed)
PROGRESS NOTE    Matthew Payne  WUJ:[id number] DOB: [birth year] DOA: 04/25/2018 PCP: Sharilyn Sites, MD    Brief Narrative:  72 year old male with a history of alcohol use, presents with abdominal pain.  Found to have evidence of pancreatitis.  Admitted for supportive management with IV fluids, pain management.  Also noted to have significantly elevated LFTs.  Further work-up in process.   Assessment & Plan:   Principal Problem:   Acute pancreatitis Active Problems:   Hypertension   Elevated lipase   Dehydration   Alcohol dependence, episodic drinking behavior (HCC)   Elevated d-dimer   Epigastric abdominal pain   Proteinuria   Leukocytosis   UTI (urinary tract infection)   Hyperlipidemia   Hyperglycemia   Abnormal transaminases   1. Acute alcoholic pancreatitis.  Patient admits to drinking alcohol regularly.  LFTs are noted to be elevated, but bile duct does not appear to be dilated.  Gallbladder was not visualized on imaging and is probably contracted.  Overall lipase is trending down and liver function tests also trending down.  Continue to treat supportively with bowel rest, IV fluids and pain management.  Gastroenterology consultation appreciated 2. Elevated LFTs.  Bile duct is not dilated.  Gallbladder is not clearly visualized.  Hepatitis panel is negative.  LFTs are trending down continue to follow 3. UTI.  Currently on intravenous ceftriaxone.  Urine culture shows no growth.  Will discontinue further antibiotics. 4. Alcohol abuse.  On CIWA protocol.  No signs of withdrawal at present 5. Hypertension.  IV hydralazine as needed. 6. Diabetes.  Patient found to have an A1c of 6.5.  This may be falsely elevated in the setting of acute illness.  Currently on sliding scale insulin.  Blood sugars have been stable.  This will need further follow-up by primary care physician 7. Hyperlipidemia.  Statin currently on hold due to elevated LFTs. 8. Abdominal distention.  Possibly ileus  in the setting of pancreatitis.  He did receive an enema today.  Abdominal x-ray is been ordered.   DVT prophylaxis: Lovenox Code Status: Full code Family Communication: Discussed with wife at the bedside Disposition Plan: Discharge home once improved   Consultants:   Gastroenterology  Procedures:     Antimicrobials:   Ceftriaxone 7/8 > 7/10   Subjective: Continues to have significant abdominal pain.  No vomiting.  He has not had a bowel movement in several days.  Reports that he is not really passing any flatus.  Objective: Vitals:   04/26/18 1523 04/26/18 1928 04/27/18 0500 04/27/18 1321  BP:  (!) 176/88 (!) 152/76 (!) 156/88  Pulse:  (!) 102 88 (!) 105  Resp:  16 18 18   Temp: 97.8 F (36.6 C) 98 F (36.7 C) 97.6 F (36.4 C) 98.5 F (36.9 C)  TempSrc: Oral Oral Oral   SpO2:  93% 96% 94%  Weight:      Height:        Intake/Output Summary (Last 24 hours) at 04/27/2018 1841 Last data filed at 04/27/2018 1500 Gross per 24 hour  Intake 2623.33 ml  Output 600 ml  Net 2023.33 ml   Filed Weights   04/25/18 1208 04/25/18 1702  Weight: 87.2 kg (192 lb 5 oz) 88.9 kg (196 lb)    Examination:  General exam: Alert, awake, oriented x 3 Respiratory system: Clear to auscultation. Respiratory effort normal. Cardiovascular system:RRR. No murmurs, rubs, gallops. Gastrointestinal system: Abdomen is distended, soft and mildly tender. No organomegaly or masses felt.  Faint bowel sounds  heard. Central nervous system: Alert and oriented. No focal neurological deficits. Extremities: No C/C/E, +pedal pulses Skin: No rashes, lesions or ulcers Psychiatry: Judgement and insight appear normal. Mood & affect appropriate.    Data Reviewed: I have personally reviewed following labs and imaging studies  CBC: Recent Labs  Lab 04/25/18 1222 04/26/18 0606 04/27/18 0612  WBC 19.7* 16.3* 16.8*  NEUTROABS 17.8* 14.2* 14.4*  HGB 15.8 14.7 13.5  HCT 48.0 45.3 42.4  MCV 94.1 95.2  96.4  PLT 177 152 585*   Basic Metabolic Panel: Recent Labs  Lab 04/25/18 1351 04/26/18 0606 04/27/18 0612  NA 139 144 144  K 4.3 4.8 4.4  CL 105 109 109  CO2 24 29 27   GLUCOSE 224* 135* 148*  BUN 28* 22 25*  CREATININE 0.96 0.64 0.63  CALCIUM 8.9 8.8* 8.6*  MG  --  1.8 1.9   GFR: Estimated Creatinine Clearance: 94.3 mL/min (by C-G formula based on SCr of 0.63 mg/dL). Liver Function Tests: Recent Labs  Lab 04/25/18 1351 04/26/18 0606 04/27/18 0612  AST 262* 137* 52*  ALT 298* 249* 155*  ALKPHOS 56 64 56  BILITOT 4.2* 4.9* 2.4*  PROT 6.4* 6.4* 6.5  ALBUMIN 3.7 3.6 3.4*   Recent Labs  Lab 04/25/18 1351 04/26/18 0606 04/27/18 0612  LIPASE 1,771* 825* 144*   No results for input(s): AMMONIA in the last 168 hours. Coagulation Profile: Recent Labs  Lab 04/26/18 1719  INR 1.16   Cardiac Enzymes: No results for input(s): CKTOTAL, CKMB, CKMBINDEX, TROPONINI in the last 168 hours. BNP (last 3 results) No results for input(s): PROBNP in the last 8760 hours. HbA1C: Recent Labs    04/25/18 1624  HGBA1C 6.5*   CBG: Recent Labs  Lab 04/26/18 1654 04/26/18 1930 04/27/18 0817 04/27/18 1137 04/27/18 1648  GLUCAP 109* 92 130* 119* 98   Lipid Profile: Recent Labs    04/25/18 1351 04/26/18 0606 04/27/18 1654  CHOL 133 118  --   HDL 41 36*  --   LDLCALC 79 69  --   TRIG 63 64 87  CHOLHDL 3.2 3.3  --    Thyroid Function Tests: Recent Labs    04/25/18 1624  TSH 0.632   Anemia Panel: No results for input(s): VITAMINB12, FOLATE, FERRITIN, TIBC, IRON, RETICCTPCT in the last 72 hours. Sepsis Labs: No results for input(s): PROCALCITON, LATICACIDVEN in the last 168 hours.  Recent Results (from the past 240 hour(s))  Culture, Urine     Status: None   Collection Time: 04/25/18 12:23 PM  Result Value Ref Range Status   Specimen Description   Final    URINE, RANDOM Performed at [facility], [address], [place], [place] [address]    Special Requests    Final    NONE Performed at [facility], [address], [place], [place] [address]    Culture   Final    NO GROWTH Performed at [facility], [place] [address], [place], [place] [address]    Report Status 04/27/2018 FINAL  Final         Radiology Studies: US Abdomen Limited Ruq  Result Date: 04/26/2018 CLINICAL DATA:  Acute pancreatitis, epigastric pain for 2 days EXAM: ULTRASOUND ABDOMEN LIMITED RIGHT UPPER QUADRANT COMPARISON:  CT abdomen and pelvis of 04/25/2017 FINDINGS: Gallbladder: The gallbladder is not definitely visualized. In review of yesterday CT there is no evidence by surgical clips of prior cholecystectomy the gallbladder could be significantly contracted. No calcified gallstones are evident.  Common bile duct: Diameter: The common bile duct is normal measuring 2.4 mm. Liver: The liver parenchyma is echogenic and inhomogeneous consistent with hepatic steatosis. No focal hepatic abnormality is seen. Portal vein flow is patent and in the normal direction. IMPRESSION: 1. Echogenic and inhomogeneous liver parenchyma consistent with hepatic steatosis. 2. The gallbladder is not visualized and may be either significantly contracted or previously resected. Correlate with surgical history. Electronically Signed   By: Ivar Drape M.D.   On: 04/26/2018 10:59        Scheduled Meds: . enoxaparin (LOVENOX) injection  40 mg Subcutaneous Q24H  . insulin aspart  0-5 Units Subcutaneous QHS  . insulin aspart  0-9 Units Subcutaneous TID WC   Continuous Infusions: . sodium chloride 150 mL/hr at 04/27/18 1725  . cefTRIAXone (ROCEPHIN)  IV Stopped (04/27/18 1828)  . famotidine (PEPCID) IV Stopped (04/27/18 1108)     LOS: 2 days    Time spent: 30 minutes    Kathie Dike, MD [facility] Pager [phone]  If 7PM-7AM, please contact night-coverage [email or web address] Password [facility] 04/27/2018, 6:41 PM

## 2018-04-27 NOTE — Care Management Important Message (Signed)
Important Message  Patient Details  Name: [name] SHUGHART MRN: [id number] Date of Birth: 12-01-1945   Medicare Important Message Given:  Yes    [name] 04/27/2018, 12:07 PM

## 2018-04-27 NOTE — Consult Note (Signed)
Referring Provider: Kathie Dike, MD Primary Care Physician:  Sharilyn Sites, MD Primary Gastroenterologist:  Dr. Laural Golden  Reason for Consultation:    Acute pancreatitis.  HPI:   Patient is 72 year old Caucasian male who was in usual state of health until until the evening of 04/24/2018 when he developed severe upper mid abdominal pain and he could not be comfortable in any position.  He also experienced nausea and vomiting but he would try to drink liquids.  He did not experience fever or chills.  He was not feeling any better by next morning and he therefore came to emergency room.  Evaluation in the emergency room revealed leukocytosis elevated bilirubin and transaminases and serum lipase of over 1700.  There was concern for aortic dissection.  Patient therefore underwent CT angios chest along with abdominal pelvic CT. this study was negative for aortic dissection but revealed typical changes of acute pancreatitis.  There was enhancement of pancreas and no evidence of pancreatic necrosis.  He was noted to have fatty liver.  Gallbladder was not seen and bile duct was not dilated. Patient was admitted to Dr. Blythe Stanford service and begun on IV fluids IV famotidine and ceftriaxone as there was concern for biliary pancreatitis.  CIWA protocol was also initiated. Patient states he did not see any improvement until this afternoon when he began to pass flatus after he was given an enema.  He complains of tight abdomen.  He complains of pain across mid and upper abdomen.  Pain is worse when he takes deep breath or coughs.  He denies chest pain or shortness of breath. He drinks beer intermittently.  He states that most he would drink on a given day would be 5 cans of beer.  He did have 4 drinks one day before onset of his symptoms. Patient tells me that he was in emergency room with abdominal pain in September 2018 and thought he had pancreatitis.  Review of data from that evaluation reveals normal lipase of 23  and CT was negative for changes of pancreatitis or dilated bile duct and gallbladder was well visualized. Review of the systems is negative for anorexia or weight loss melena or rectal bleeding. He is up-to-date on screening for CRC.  Last exam was in August 2012 with removal of 2 small polyps and they were not adenomas.  He was advised to come back in 2022.  He is married.  He has 3 children.  Both his sons and daughter in good health.  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  He smokes cigarettes for about 15 years about a pack a day and quit in Mar 07, 1979.  He only drinks beer as noted above 3 to 5 cans on days when he does but he does not drink daily.  Family history significant for pancreatic carcinoma in his father who died within few months of diagnosis.  He was around 98. Mother lived to be 94. He has 2 brothers living ages 31 and 58 in their good health.  He lost a brother because of CHF at age 103.   Past Medical History:  Diagnosis Date  . Hypertension        Hyperlipidemia.  Past Surgical History:  Procedure Laterality Date  . CATARACT EXTRACTION, BILATERAL  5 yrs ago  . COLONOSCOPY  05/28/2011   Procedure: COLONOSCOPY;  Surgeon: [name], MD;  Location: [facility];  Service: Endoscopy;  Laterality: N/A;  . CYSTECTOMY  back & leg  . INGUINAL HERNIA REPAIR  07/01/2012   Procedure: HERNIA REPAIR INGUINAL ADULT;  Surgeon: [name], MD;  Location: [facility];  Service: General;  Laterality: Right;  Right Inguinal Herniorraphy with Mesh  . Nesbit  Plication    . TONSILLECTOMY      Prior to Admission medications   Medication Sig Start Date End Date Taking? Authorizing Provider  diphenhydramine-acetaminophen (TYLENOL PM) 25-500 MG TABS tablet Take 2 tablets by mouth at bedtime as needed (sleep/pain).   Yes [provider]  enalapril (VASOTEC) 10 MG tablet Take 10 mg by mouth daily.   Yes [provider]  Multiple  Vitamins-Minerals (EMERGEN-C VITAMIN C) CHEW Chew 1 tablet by mouth daily.   Yes [provider]  naproxen sodium (ALEVE) 220 MG tablet Take 220 mg by mouth 2 (two) times daily as needed (pain).    Yes [provider]  pravastatin (PRAVACHOL) 40 MG tablet Take 40 mg by mouth daily.    [provider]    Current Facility-Administered Medications  Medication Dose Route Frequency Provider Last Rate Last Dose  . 0.9 %  sodium chloride infusion   Intravenous Continuous [name], MD 100 mL/hr at 04/27/18 1247    . acetaminophen (TYLENOL) tablet 650 mg  650 mg Oral Q6H PRN Johnson, Clanford L, MD       Or  . acetaminophen (TYLENOL) suppository 650 mg  650 mg Rectal Q6H PRN Johnson, Clanford L, MD      . cefTRIAXone (ROCEPHIN) 1 g in sodium chloride 0.9 % 100 mL IVPB  1 g Intravenous Q24H Murlean Iba, MD   Stopped at 04/26/18 1811  . enoxaparin (LOVENOX) injection 40 mg  40 mg Subcutaneous Q24H Johnson, Clanford L, MD   40 mg at 04/26/18 1735  . famotidine (PEPCID) IVPB 20 mg premix  20 mg Intravenous Q12H Murlean Iba, MD   Stopped at 04/27/18 1108  . hydrALAZINE (APRESOLINE) injection 10 mg  10 mg Intravenous Q4H PRN Johnson, Clanford L, MD      . insulin aspart (novoLOG) injection 0-5 Units  0-5 Units Subcutaneous QHS Johnson, Clanford L, MD      . insulin aspart (novoLOG) injection 0-9 Units  0-9 Units Subcutaneous TID WC Johnson, Clanford L, MD   1 Units at 04/27/18 0841  . LORazepam (ATIVAN) injection 0.5 mg  0.5 mg Intravenous Q4H PRN Wynetta Emery, Clanford L, MD   0.5 mg at 04/26/18 2258  . morphine 2 MG/ML injection 2-4 mg  2-4 mg Intravenous Q3H PRN Wynetta Emery, Clanford L, MD   4 mg at 04/27/18 1122  . ondansetron (ZOFRAN) tablet 4 mg  4 mg Oral Q6H PRN Johnson, Clanford L, MD       Or  . ondansetron (ZOFRAN) injection 4 mg  4 mg Intravenous Q6H PRN Johnson, Clanford L, MD   4 mg at 04/27/18 1117  . senna-docusate (Senokot-S) tablet 1 tablet  1 tablet  Oral QHS PRN Johnson, Clanford L, MD      . traZODone (DESYREL) tablet 25 mg  25 mg Oral QHS PRN Murlean Iba, MD        Allergies as of 04/25/2018  . (No Known Allergies)    History reviewed. No pertinent family history.  Social History   Socioeconomic History  . Marital status: Married    Spouse name: Not on file  . Number of children: Not on file  . Years of education: Not on file  .  Highest education level: Not on file  Occupational History  . Not on file  Social Needs  . Financial resource strain: Not on file  . Food insecurity:    Worry: Not on file    Inability: Not on file  . Transportation needs:    Medical: Not on file    Non-medical: Not on file  Tobacco Use  . Smoking status: Former Smoker    Last attempt to quit: 02/24/1978    Years since quitting: 40.1  . Smokeless tobacco: Never Used  Substance and Sexual Activity  . Alcohol use: Yes  . Drug use: No  . Sexual activity: Yes  Lifestyle  . Physical activity:    Days per week: Not on file    Minutes per session: Not on file  . Stress: Not on file  Relationships  . Social connections:    Talks on phone: Not on file    Gets together: Not on file    Attends religious service: Not on file    Active member of club or organization: Not on file    Attends meetings of clubs or organizations: Not on file    Relationship status: Not on file  . Intimate partner violence:    Fear of current or ex partner: Not on file    Emotionally abused: Not on file    Physically abused: Not on file    Forced sexual activity: Not on file  Other Topics Concern  . Not on file  Social History Narrative  . Not on file    Review of Systems: See HPI, otherwise normal ROS  Physical Exam: Temp:  [97.6 F (36.4 C)-98.5 F (36.9 C)] 98.5 F (36.9 C) (07/10 1321) Pulse Rate:  [88-105] 105 (07/10 1321) Resp:  [16-18] 18 (07/10 1321) BP: (152-176)/(76-88) 156/88 (07/10 1321) SpO2:  [93 %-96 %] 94 % (07/10 1321) Last  BM Date: 04/25/18  Patient is alert and in no acute distress. Sclera is mildly icteric.  Conjunctiva is pink. Oropharyngeal mucosa is normal.   He has upper dentures in place. Neck masses or thyromegaly noted. Cardiac exam with regular rhythm normal S1 and S2.  No murmur gallop noted. Auscultation of lungs reveal vesicular breath sounds bilaterally.  Breath sounds are diminished in both bases. Abdomen is distended with hypoactive bowel sounds.  Percussion note is very tympanitic.  On palpation abdomen is tight with mild tenderness in periumbilical region and epigastric region.  No organomegaly or masses. No peripheral edema or clubbing noted.   Lab Results: Recent Labs    04/25/18 1222 04/26/18 0606 04/27/18 0612  WBC 19.7* 16.3* 16.8*  HGB 15.8 14.7 13.5  HCT 48.0 45.3 42.4  PLT 177 152 140*   BMET Recent Labs    04/25/18 1351 04/26/18 0606 04/27/18 0612  NA 139 144 144  K 4.3 4.8 4.4  CL 105 109 109  CO2 24 29 27   GLUCOSE 224* 135* 148*  BUN 28* 22 25*  CREATININE 0.96 0.64 0.63  CALCIUM 8.9 8.8* 8.6*   LFT Recent Labs    04/27/18 0612  PROT 6.5  ALBUMIN 3.4*  AST 52*  ALT 155*  ALKPHOS 56  BILITOT 2.4*   PT/INR Recent Labs    04/26/18 1719  LABPROT 14.7  INR 1.16   Hepatitis Panel Recent Labs    04/26/18 1719  HEPBSAG Negative  HCVAB <0.1  HEPAIGM Negative  HEPBIGM Negative    Studies/Results: US Abdomen Limited Ruq  Result Date: 04/26/2018 CLINICAL DATA:  Acute pancreatitis, epigastric pain for 2 days EXAM: ULTRASOUND ABDOMEN LIMITED RIGHT UPPER QUADRANT COMPARISON:  CT abdomen and pelvis of 04/25/2017 FINDINGS: Gallbladder: The gallbladder is not definitely visualized. In review of yesterday CT there is no evidence by surgical clips of prior cholecystectomy the gallbladder could be significantly contracted. No calcified gallstones are evident. Common bile duct: Diameter: The common bile duct is normal measuring 2.4 mm. Liver: The liver  parenchyma is echogenic and inhomogeneous consistent with hepatic steatosis. No focal hepatic abnormality is seen. Portal vein flow is patent and in the normal direction. IMPRESSION: 1. Echogenic and inhomogeneous liver parenchyma consistent with hepatic steatosis. 2. The gallbladder is not visualized and may be either significantly contracted or previously resected. Correlate with surgical history. Electronically Signed   By: Ivar Drape M.D.   On: 04/26/2018 10:59   I have reviewed imaging studies including CT from June 22, 2017  Assessment;  Patient is 72 year old Caucasian male with acute pancreatitis and elevated transaminases and bilirubin which are trending downwards. This scenario raises the possibility of biliary pancreatitis but imaging  studies(CT and [place]) negative for cholelithiasis or dilated bile duct.  Patient reports having 4 drinks of beer the night before.  He therefore could have alcoholic pancreatitis since no other etiology is obvious. AST and ALT ratio not what we typically see with alcoholic hepatitis.  Markers for hepatitis B and C are negative. It appears patient has developed ileus secondary to pancreatitis.  He is starting to pass flatus which seem to be helping. He will need to n.p.o. until pain intensity decreases significantly and ileus has resolved. Dr. Roderic Palau requested MRCP but system is down and study not available for now. He may or may not need follow-up CT depending on his clinical course.   Recommendations;  Continue NPO status except for ice chips and p.o. Medications. Increase IV fluids 250 mL/h. Check serum triglyceride on admission blood if possible. Repeat labs in a.m. to include CBC and comprehensive chemistry panel. Monitor urine output. Patient will be reevaluated in a.m.    LOS: 2 days   Shironda Kain  04/27/2018, 4:43 PM

## 2018-04-28 DIAGNOSIS — F10239 Alcohol dependence with withdrawal, unspecified: Secondary | ICD-10-CM

## 2018-04-28 LAB — GLUCOSE, CAPILLARY
GLUCOSE-CAPILLARY: 87 mg/dL (ref 70–99)
Glucose-Capillary: 100 mg/dL — ABNORMAL HIGH (ref 70–99)
Glucose-Capillary: 90 mg/dL (ref 70–99)
Glucose-Capillary: 78 mg/dL (ref 70–99)

## 2018-04-28 LAB — COMPREHENSIVE METABOLIC PANEL
ALK PHOS: 54 U/L (ref 38–126)
ALT: 106 U/L — ABNORMAL HIGH (ref 0–44)
ANION GAP: 6 (ref 5–15)
AST: 35 U/L (ref 15–41)
Albumin: 3.1 g/dL — ABNORMAL LOW (ref 3.5–5.0)
BILIRUBIN TOTAL: 1.8 mg/dL — AB (ref 0.3–1.2)
BUN: 21 mg/dL (ref 8–23)
CALCIUM: 8.8 mg/dL — AB (ref 8.9–10.3)
CO2: 29 mmol/L (ref 22–32)
Chloride: 110 mmol/L (ref 98–111)
Creatinine, Ser: 0.53 mg/dL — ABNORMAL LOW (ref 0.61–1.24)
GFR calc non Af Amer: >60 mL/min (ref >60)
GLUCOSE: 133 mg/dL — AB (ref 70–99)
Potassium: 3.3 mmol/L — ABNORMAL LOW (ref 3.5–5.1)
Sodium: 145 mmol/L (ref 135–145)
Total Protein: 6.4 g/dL — ABNORMAL LOW (ref 6.5–8.1)

## 2018-04-28 LAB — CBC WITH DIFFERENTIAL/PLATELET
BASOS PCT: 0 %
Basophils Absolute: 0 10*3/uL (ref 0.0–0.1)
EOS ABS: 0 10*3/uL (ref 0.0–0.7)
Eosinophils Relative: 0 %
HCT: 39.4 % (ref 39.0–52.0)
HEMOGLOBIN: 12.5 g/dL — AB (ref 13.0–17.0)
Lymphocytes Relative: 10 %
Lymphs Abs: 1.5 10*3/uL (ref 0.7–4.0)
MCH: 30.4 pg (ref 26.0–34.0)
MCHC: 31.7 g/dL (ref 30.0–36.0)
MCV: 95.9 fL (ref 78.0–100.0)
MONOS PCT: 9 %
Monocytes Absolute: 1.4 10*3/uL — ABNORMAL HIGH (ref 0.1–1.0)
NEUTROS PCT: 81 %
Neutro Abs: 12.1 10*3/uL — ABNORMAL HIGH (ref 1.7–7.7)
PLATELETS: 146 10*3/uL — AB (ref 150–400)
RBC: 4.11 MIL/uL — ABNORMAL LOW (ref 4.22–5.81)
RDW: 13.8 % (ref 11.5–15.5)
WBC: 14.9 10*3/uL — AB (ref 4.0–10.5)

## 2018-04-28 LAB — MAGNESIUM: Magnesium: 2.2 mg/dL (ref 1.7–2.4)

## 2018-04-28 LAB — LIPASE, BLOOD: LIPASE: 48 U/L (ref 11–51)

## 2018-04-28 MED ORDER — LORAZEPAM 1 MG PO TABS
1.0000 mg | ORAL_TABLET | Freq: Four times a day (QID) | ORAL | Status: AC | PRN
  Administered 2018-04-28: 1 mg via ORAL
  Filled 2018-04-28: qty 1

## 2018-04-28 MED ORDER — LORAZEPAM 1 MG PO TABS
0.0000 mg | ORAL_TABLET | Freq: Four times a day (QID) | ORAL | Status: AC
  Administered 2018-04-28: 2 mg via ORAL
  Administered 2018-04-28: 1 mg via ORAL
  Administered 2018-04-28 – 2018-04-29 (×6): 2 mg via ORAL
  Filled 2018-04-28 (×3): qty 2
  Filled 2018-04-28: qty 1
  Filled 2018-04-28 (×4): qty 2

## 2018-04-28 MED ORDER — FOLIC ACID 1 MG PO TABS
1.0000 mg | ORAL_TABLET | Freq: Every day | ORAL | Status: DC
  Administered 2018-04-28 – 2018-05-02 (×5): 1 mg via ORAL
  Filled 2018-04-28 (×5): qty 1

## 2018-04-28 MED ORDER — VITAMIN B-1 100 MG PO TABS
100.0000 mg | ORAL_TABLET | Freq: Every day | ORAL | Status: DC
Start: 2018-04-28 — End: 2018-05-02
  Administered 2018-04-28 – 2018-05-02 (×5): 100 mg via ORAL
  Filled 2018-04-28 (×5): qty 1

## 2018-04-28 MED ORDER — ADULT MULTIVITAMIN W/MINERALS CH
1.0000 | ORAL_TABLET | Freq: Every day | ORAL | Status: DC
  Administered 2018-04-28 – 2018-05-02 (×5): 1 via ORAL
  Filled 2018-04-28 (×5): qty 1

## 2018-04-28 MED ORDER — LORAZEPAM 1 MG PO TABS
0.0000 mg | ORAL_TABLET | Freq: Two times a day (BID) | ORAL | Status: AC
  Administered 2018-04-30 (×2): 2 mg via ORAL
  Administered 2018-05-01: 1 mg via ORAL
  Filled 2018-04-28 (×2): qty 2
  Filled 2018-04-28: qty 1

## 2018-04-28 MED ORDER — LORAZEPAM 2 MG/ML IJ SOLN: 1.0000 mg | Freq: Four times a day (QID) | INTRAMUSCULAR | Status: AC | PRN

## 2018-04-28 MED ORDER — LINACLOTIDE 145 MCG PO CAPS
145.0000 ug | ORAL_CAPSULE | Freq: Every day | ORAL | Status: DC
  Administered 2018-04-29 – 2018-05-02 (×4): 145 ug via ORAL
  Filled 2018-04-28 (×4): qty 1

## 2018-04-28 MED ORDER — THIAMINE HCL 100 MG/ML IJ SOLN
100.0000 mg | Freq: Every day | INTRAMUSCULAR | Status: DC
Start: 2018-04-28 — End: 2018-05-02
  Filled 2018-04-28: qty 2

## 2018-04-28 NOTE — Progress Notes (Signed)
PROGRESS NOTE    Matthew Payne  XMI:[id number] DOB: 05-21-1946 DOA: 04/25/2018 PCP: Sharilyn Sites, MD    Brief Narrative:  72 year old male with a history of alcohol use, presents with abdominal pain.  Found to have evidence of pancreatitis.  Admitted for supportive management with IV fluids, pain management.  Also noted to have significantly elevated LFTs.  Further work-up in process.   Assessment & Plan:   Principal Problem:   Acute pancreatitis Active Problems:   Hypertension   Elevated lipase   Dehydration   Alcohol dependence, episodic drinking behavior (HCC)   Elevated d-dimer   Epigastric abdominal pain   Proteinuria   Leukocytosis   UTI (urinary tract infection)   Hyperlipidemia   Hyperglycemia   Abnormal transaminases   1. Acute alcoholic pancreatitis.  Patient admits to drinking alcohol regularly.  LFTs are noted to be elevated, but bile duct does not appear to be dilated.  Gallbladder was not visualized on imaging and is probably contracted.  Overall lipase is trending down and liver function tests also trending down.  Overall abdominal pain is better.  We will try on full liquids.  GI following.  He is developing some peripheral edema.  Will discontinue IV fluids.  Encourage p.o. fluids. 2. Elevated LFTs.  Bile duct is not dilated.  Gallbladder is not clearly visualized.  Hepatitis panel is negative.  LFTs are trending down continue to follow 3. Alcohol abuse with withdrawal.  On CIWA protocol.  Patient is having signs of active withdrawal.  Continue sitter precautions 4. Hypertension.  IV hydralazine as needed. 5. Diabetes.  Patient found to have an A1c of 6.5.  This may be falsely elevated in the setting of acute illness.  Currently on sliding scale insulin.  Blood sugars have been stable.  This will need further follow-up by primary care physician 6. Hyperlipidemia.  Statin currently on hold due to elevated LFTs. 7. Ileus.  Secondary to pancreatitis.  Received an  enema yesterday with bowel movement.  Still has some abdominal distention.  Will start on Linzess..   DVT prophylaxis: Lovenox Code Status: Full code Family Communication: No family present Disposition Plan: Discharge home once improved   Consultants:   Gastroenterology  Procedures:     Antimicrobials:   Ceftriaxone 7/8 > 7/10   Subjective: Patient did have a bowel movement last night.  He is confused at this time and cannot provide significant history.  He has required more Ativan.  Objective: Vitals:   04/28/18 0500 04/28/18 0831 04/28/18 1439 04/28/18 1505  BP: (!) 142/88 (!) 156/87 (!) 153/77 (!) 158/84  Pulse: (!) 102 (!) 102 98 (!) 101  Resp: 19 18 20 18   Temp: 98.2 F (36.8 C)  98.8 F (37.1 C) 98.9 F (37.2 C)  TempSrc: Oral   Oral  SpO2: 96% 100% 99% 94%  Weight:      Height:        Intake/Output Summary (Last 24 hours) at 04/28/2018 1825 Last data filed at 04/28/2018 1600 Gross per 24 hour  Intake 4060 ml  Output 1100 ml  Net 2960 ml   Filed Weights   04/25/18 1208 04/25/18 1702  Weight: 87.2 kg (192 lb 5 oz) 88.9 kg (196 lb)    Examination:  General exam: Alert, awake, no distress Respiratory system: Clear to auscultation. Respiratory effort normal. Cardiovascular system:RRR. No murmurs, rubs, gallops. Gastrointestinal system: Abdomen is distended, soft and nontender. No organomegaly or masses felt. Normal bowel sounds heard. Central nervous system: No focal  neurological deficits.  He does not appear tremulous, but is confused and try to get out of bed Extremities: 1+ edema bilaterally Skin: No rashes, lesions or ulcers Psychiatry: Somnolent, confused .    Data Reviewed: I have personally reviewed following labs and imaging studies  CBC: Recent Labs  Lab 04/25/18 1222 04/26/18 0606 04/27/18 0612 04/28/18 0459  WBC 19.7* 16.3* 16.8* 14.9*  NEUTROABS 17.8* 14.2* 14.4* 12.1*  HGB 15.8 14.7 13.5 12.5*  HCT 48.0 45.3 42.4 39.4  MCV  94.1 95.2 96.4 95.9  PLT 177 152 140* 035*   Basic Metabolic Panel: Recent Labs  Lab 04/25/18 1351 04/26/18 0606 04/27/18 0612 04/28/18 0459  NA 139 144 144 145  K 4.3 4.8 4.4 3.3*  CL 105 109 109 110  CO2 24 29 27 29   GLUCOSE 224* 135* 148* 133*  BUN 28* 22 25* 21  CREATININE 0.96 0.64 0.63 0.53*  CALCIUM 8.9 8.8* 8.6* 8.8*  MG  --  1.8 1.9 2.2   GFR: Estimated Creatinine Clearance: 94.3 mL/min (A) (by C-G formula based on SCr of 0.53 mg/dL (L)). Liver Function Tests: Recent Labs  Lab 04/25/18 1351 04/26/18 0606 04/27/18 0612 04/28/18 0459  AST 262* 137* 52* 35  ALT 298* 249* 155* 106*  ALKPHOS 56 64 56 54  BILITOT 4.2* 4.9* 2.4* 1.8*  PROT 6.4* 6.4* 6.5 6.4*  ALBUMIN 3.7 3.6 3.4* 3.1*   Recent Labs  Lab 04/25/18 1351 04/26/18 0606 04/27/18 0612 04/28/18 0459  LIPASE 1,771* 825* 144* 48   No results for input(s): AMMONIA in the last 168 hours. Coagulation Profile: Recent Labs  Lab 04/26/18 1719  INR 1.16   Cardiac Enzymes: No results for input(s): CKTOTAL, CKMB, CKMBINDEX, TROPONINI in the last 168 hours. BNP (last 3 results) No results for input(s): PROBNP in the last 8760 hours. HbA1C: No results for input(s): HGBA1C in the last 72 hours. CBG: Recent Labs  Lab 04/27/18 1648 04/27/18 2058 04/28/18 0803 04/28/18 1156 04/28/18 1612  GLUCAP 98 80 100* 90 87   Lipid Profile: Recent Labs    04/26/18 0606 04/27/18 1654  CHOL 118  --   HDL 36*  --   LDLCALC 69  --   TRIG 64 87  CHOLHDL 3.3  --    Thyroid Function Tests: No results for input(s): TSH, T4TOTAL, FREET4, T3FREE, THYROIDAB in the last 72 hours. Anemia Panel: No results for input(s): VITAMINB12, FOLATE, FERRITIN, TIBC, IRON, RETICCTPCT in the last 72 hours. Sepsis Labs: No results for input(s): PROCALCITON, LATICACIDVEN in the last 168 hours.  Recent Results (from the past 240 hour(s))  Culture, Urine     Status: None   Collection Time: 04/25/18 12:23 PM  Result Value Ref  Range Status   Specimen Description   Final    URINE, RANDOM Performed at [facility], [address], [place], [place] [address]    Special Requests   Final    NONE Performed at [facility], [address], [place], [place] [address]    Culture   Final    NO GROWTH Performed at [facility], [place] [address] [place],  [address]    Report Status 04/27/2018 FINAL  Final         Radiology Studies: Dg Abd 2 Views  Result Date: 04/27/2018 CLINICAL DATA:  Abdominal distention.  Admitted with pancreatitis EXAM: ABDOMEN - 2 VIEW COMPARISON:  04/25/2018 FINDINGS: Gas dilated small bowel, proximal colon, and stomach. This is likely reactive ileus in the setting  of pancreatitis. The duodenal C-loop appears widened with fold thickening that is attributed to pancreatic edema. Atelectasis at the bases. IMPRESSION: 1. Ileus. 2. Deformed duodenal C-loop attributed to pancreatitis. 3. Atelectasis at the bases. Electronically Signed   By: Monte Fantasia M.D.   On: 04/27/2018 19:26        Scheduled Meds: . enoxaparin (LOVENOX) injection  40 mg Subcutaneous Q24H  . folic acid  1 mg Oral Daily  . insulin aspart  0-5 Units Subcutaneous QHS  . insulin aspart  0-9 Units Subcutaneous TID WC  . [START ON 04/29/2018] linaclotide  145 mcg Oral QAC breakfast  . LORazepam  0-4 mg Oral Q6H   Followed by  . [START ON 04/30/2018] LORazepam  0-4 mg Oral Q12H  . multivitamin with minerals  1 tablet Oral Daily  . thiamine  100 mg Oral Daily   Or  . thiamine  100 mg Intravenous Daily   Continuous Infusions: . famotidine (PEPCID) IV Stopped (04/28/18 0900)     LOS: 3 days    Time spent: 30 minutes    Kathie Dike, MD [facility] Pager [phone]  If 7PM-7AM, please contact night-coverage [email or web address] Password [facility] 04/28/2018, 6:25 PM

## 2018-04-28 NOTE — Progress Notes (Signed)
  Subjective:  Patient is drowsy but responds to questions.  He denies abdominal pain.  Objective: Blood pressure (!) 158/84, pulse (!) 101, temperature 98.9 F (37.2 C), temperature source Oral, resp. rate 18, height 6' (1.829 m), weight 196 lb (88.9 kg), SpO2 94 %. Patient is very drowsy and responds appropriately to questions. Cardiac exam with regular rhythm normal S1 and S2. No murmur or gallop noted. Lungs are clear to auscultation. Abdomen remains distended but not as much as yesterday.  Bowel sounds are hypoactive.  On palpation it is somewhat tense and tympanitic.  No tenderness noted. No LE edema or clubbing noted.   Urine output 1150 ML last 24 hours.  Labs/studies Results:  Recent Labs    15-May-2018 0606 04/27/18 0612 04/28/18 0459  WBC 16.3* 16.8* 14.9*  HGB 14.7 13.5 12.5*  HCT 45.3 42.4 39.4  PLT 152 140* 146*    BMET  Recent Labs    05-15-18 0606 04/27/18 0612 04/28/18 0459  NA 144 144 145  K 4.8 4.4 3.3*  CL 109 109 110  CO2 29 27 29   GLUCOSE 135* 148* 133*  BUN 22 25* 21  CREATININE 0.64 0.63 0.53*  CALCIUM 8.8* 8.6* 8.8*    LFT  Recent Labs    05/15/18 0606 04/27/18 0612 04/28/18 0459  PROT 6.4* 6.5 6.4*  ALBUMIN 3.6 3.4* 3.1*  AST 137* 52* 35  ALT 249* 155* 106*  ALKPHOS 64 56 54  BILITOT 4.9* 2.4* 1.8*   Triglyceride level 87.  Assessment:  #1.  Acute pancreatitis most likely secondary to alcohol mediated injury.  No evidence of cholelithiasis or choledocholithiasis.  He appears to be gradually improving.  WBC is down.  He is having less pain but symptoms may be somewhat masked because of sedation.  #2.  Abnormal LFTs.  Renewed gradual improvement in transaminases and bilirubin levels.  CT and ultrasound did not show gallstones dilated bile duct.  Viral markers for hepatitis B and C are negative.  AST and ALT ratio not typical of alcoholic hepatitis but transaminitis may well be due to alcohol induced injury.  Patient is on PRN  acetaminophen but he has not received any since admission.  #3.  Alcohol withdrawal.  Patient developed signs and symptoms of alcohol withdrawal last night and has been receiving lorazepam CIWA protocol.  Patient is on folic acid and thiamine.   Recommendations:  Advance diet to full liquids. Agree with Dr. Roderic Palau about using IV fluid rate.

## 2018-04-28 NOTE — Progress Notes (Signed)
Patient ID: Matthew Payne, male   DOB: 05-19-1946, 72 y.o.   MRN: [id number] Sitter in room due to wondering. RN stated patient with DTs last night and received IV Ativan. Apparently pulled out his IV during the night. IV going at 125cc hr. Has been up to BR voiding. Has been incontinent of urine.  Had one BM during the night per RN. Lipase normal at 48 this am. K 3.3 and I asked RN to let Hospitalist know.  Blood pressure (!) 142/88, pulse (!) 102, temperature 98.2 F (36.8 C), temperature source Oral, resp. rate 19, height 6' (1.829 m), weight 196 lb (88.9 kg), SpO2 96 %.  Sitting in chair. Abdomen distended, tense. BS are present. Lungs are clear. Assessment: Acute pancreatitis. Triglycerides are normal.  Plan: Continue NPO status.  Monitor urine output (I spoke with RN concerning this).

## 2018-04-29 LAB — COMPREHENSIVE METABOLIC PANEL
ALBUMIN: 2.9 g/dL — AB (ref 3.5–5.0)
ALT: 73 U/L — AB (ref 0–44)
AST: 28 U/L (ref 15–41)
Alkaline Phosphatase: 53 U/L (ref 38–126)
Anion gap: 10 (ref 5–15)
BUN: 21 mg/dL (ref 8–23)
CHLORIDE: 109 mmol/L (ref 98–111)
CO2: 27 mmol/L (ref 22–32)
Calcium: 9.1 mg/dL (ref 8.9–10.3)
Creatinine, Ser: 0.61 mg/dL (ref 0.61–1.24)
GFR calc Af Amer: >60 mL/min (ref >60)
GFR calc non Af Amer: >60 mL/min (ref >60)
GLUCOSE: 120 mg/dL — AB (ref 70–99)
POTASSIUM: 2.9 mmol/L — AB (ref 3.5–5.1)
Sodium: 146 mmol/L — ABNORMAL HIGH (ref 135–145)
Total Bilirubin: 1.8 mg/dL — ABNORMAL HIGH (ref 0.3–1.2)
Total Protein: 6.2 g/dL — ABNORMAL LOW (ref 6.5–8.1)

## 2018-04-29 LAB — CBC WITH DIFFERENTIAL/PLATELET
Basophils Absolute: 0 10*3/uL (ref 0.0–0.1)
Basophils Relative: 0 %
Eosinophils Absolute: 0 10*3/uL (ref 0.0–0.7)
Eosinophils Relative: 0 %
HCT: 38.6 % — ABNORMAL LOW (ref 39.0–52.0)
Hemoglobin: 12.2 g/dL — ABNORMAL LOW (ref 13.0–17.0)
LYMPHS ABS: 1.4 10*3/uL (ref 0.7–4.0)
LYMPHS PCT: 12 %
MCH: 29.9 pg (ref 26.0–34.0)
MCHC: 31.6 g/dL (ref 30.0–36.0)
MCV: 94.6 fL (ref 78.0–100.0)
MONO ABS: 1.2 10*3/uL — AB (ref 0.1–1.0)
MONOS PCT: 10 %
Neutro Abs: 9.4 10*3/uL — ABNORMAL HIGH (ref 1.7–7.7)
Neutrophils Relative %: 78 %
Platelets: 178 10*3/uL (ref 150–400)
RBC: 4.08 MIL/uL — AB (ref 4.22–5.81)
RDW: 13.7 % (ref 11.5–15.5)
WBC: 12.1 10*3/uL — ABNORMAL HIGH (ref 4.0–10.5)

## 2018-04-29 LAB — GLUCOSE, CAPILLARY
GLUCOSE-CAPILLARY: 121 mg/dL — AB (ref 70–99)
GLUCOSE-CAPILLARY: 94 mg/dL (ref 70–99)
Glucose-Capillary: 112 mg/dL — ABNORMAL HIGH (ref 70–99)

## 2018-04-29 MED ORDER — SODIUM CHLORIDE 0.9 % IV SOLN
INTRAVENOUS | Status: DC | PRN
  Administered 2018-04-29: 17:00:00 via INTRAVENOUS

## 2018-04-29 MED ORDER — SODIUM CHLORIDE 0.45 % IV SOLN: INTRAVENOUS | Status: DC

## 2018-04-29 MED ORDER — SODIUM CHLORIDE 0.45 % IV SOLN
INTRAVENOUS | Status: DC
  Administered 2018-04-30: via INTRAVENOUS

## 2018-04-29 MED ORDER — ALBUTEROL SULFATE (2.5 MG/3ML) 0.083% IN NEBU
2.5000 mg | INHALATION_SOLUTION | Freq: Four times a day (QID) | RESPIRATORY_TRACT | Status: DC | PRN
  Administered 2018-04-29: 2.5 mg via RESPIRATORY_TRACT
  Filled 2018-04-29: qty 3

## 2018-04-29 MED ORDER — POTASSIUM CHLORIDE 10 MEQ/100ML IV SOLN
10.0000 meq | INTRAVENOUS | Status: AC
  Administered 2018-04-29 (×4): 10 meq via INTRAVENOUS
  Filled 2018-04-29 (×3): qty 100

## 2018-04-29 NOTE — Care Management Important Message (Signed)
Important Message  Patient Details  Name: Matthew Payne MRN: [id number] Date of Birth: 03/26/1946   Medicare Important Message Given:  Yes    [name] 04/29/2018, 12:19 PM

## 2018-04-29 NOTE — Progress Notes (Signed)
  Subjective:  Patient is sitting in reclining chair.  He denies abdominal pain nausea or vomiting.  According to sitter he ate all of his lunch. He also had explosive bowel movement with loose stool and passed a lot of gas.  Objective: Blood pressure (!) 155/82, pulse 91, temperature 98.1 F (36.7 C), temperature source Oral, resp. rate 20, height 6' (1.829 m), weight 196 lb (88.9 kg), SpO2 95 %. Patient is drowsy but responds appropriate to questions. Abdomen is less distended.  Bowel sounds are normal.  On palpation is somewhat tense but no tenderness organomegaly or masses. No LE edema.  Urine output 950 mL last 24 hours; possibly not accurate.  Labs/studies Results:  Recent Labs    May 02, 2018 0612 04/28/18 0459 04/29/18 0454  WBC 16.8* 14.9* 12.1*  HGB 13.5 12.5* 12.2*  HCT 42.4 39.4 38.6*  PLT 140* 146* 178    BMET  Recent Labs    05-02-18 0612 04/28/18 0459 04/29/18 0454  NA 144 145 146*  K 4.4 3.3* 2.9*  CL 109 110 109  CO2 27 29 27   GLUCOSE 148* 133* 120*  BUN 25* 21 21  CREATININE 0.63 0.53* 0.61  CALCIUM 8.6* 8.8* 9.1    LFT  Recent Labs    May 02, 2018 0612 04/28/18 0459 04/29/18 0454  PROT 6.5 6.4* 6.2*  ALBUMIN 3.4* 3.1* 2.9*  AST 52* 35 28  ALT 155* 106* 73*  ALKPHOS 56 54 53  BILITOT 2.4* 1.8* 1.8*    PT/INR  Recent Labs    04/26/18 1719  LABPROT 14.7  INR 1.16    Hepatitis Panel  Recent Labs    04/26/18 1719  HEPBSAG Negative  HCVAB <0.1  HEPAIGM Negative  HEPBIGM Negative     Assessment:  #1.  Acute pancreatitis.  Etiology appears to be most likely due to alcohol.  It is obvious by now that he is drinking more than he thinks.  Since patient's transaminases and bilirubin were elevated there was a concern for biliary pancreatitis 2 imaging studies are negative for choledocholithiasis dilated bile duct or cholelithiasis. Patient is on ceftriaxone primarily for urinary tract infection.  #2.  Alcohol withdrawal.  She and is on CIWA  protocol.  He is drowsy but responds appropriately.  #3.  Elevated transaminases.  AST is now normal and ALT has come down.  AST/ALT ratio not typical of alcoholic hepatitis.  This may still be the case.  Markers for hepatitis B and C are negative.  Recommendations:  If patient continues to do well diet could be advanced tomorrow.  Dr. Gala Romney will be seen patient over the weekend.

## 2018-04-29 NOTE — Progress Notes (Signed)
PROGRESS NOTE    Matthew Payne  IWL:[id number] DOB: [birth year] DOA: 04/25/2018 PCP: Sharilyn Sites, MD    Brief Narrative:  72 year old male with a history of alcohol use, presents with abdominal pain.  Found to have evidence of pancreatitis.  Admitted for supportive management with IV fluids, pain management.  Also noted to have significantly elevated LFTs.  Further work-up in process.   Assessment & Plan:   Principal Problem:   Acute pancreatitis Active Problems:   Hypertension   Elevated lipase   Dehydration   Alcohol dependence, episodic drinking behavior (HCC)   Elevated d-dimer   Epigastric abdominal pain   Proteinuria   Leukocytosis   UTI (urinary tract infection)   Hyperlipidemia   Hyperglycemia   Abnormal transaminases   1. Acute alcoholic pancreatitis.  Patient admits to drinking alcohol regularly.  LFTs are noted to be elevated, but bile duct does not appear to be dilated.  Gallbladder was not visualized on imaging and is probably contracted.  Overall lipase is trending down and liver function tests also trending down.  Overall abdominal pain is better.  Appears to be tolerating full liquids.  GI following.  Can consider advancing diet tomorrow if continues to improve. 2. Elevated LFTs.  Bile duct is not dilated.  Gallbladder is not clearly visualized.  Hepatitis panel is negative.  LFTs are trending down continue to follow 3. Alcohol abuse with withdrawal.  On CIWA protocol.  Patient continues to have signs of withdrawal requiring Ativan.  Continue sitter precautions 4. Hypertension.  IV hydralazine as needed. 5. Diabetes.  Patient found to have an A1c of 6.5.  This may be falsely elevated in the setting of acute illness.  Currently on sliding scale insulin.  Blood sugars have been stable.  This will need further follow-up by primary care physician 6. Hyperlipidemia.  Statin currently on hold due to elevated LFTs. 7. Ileus.  Secondary to pancreatitis.  Patient did have  a bowel movement earlier today.  Continue Linzess.  Abdominal distention improving.   DVT prophylaxis: Lovenox Code Status: Full code Family Communication: No family present Disposition Plan: Discharge home once improved   Consultants:   Gastroenterology  Procedures:     Antimicrobials:   Ceftriaxone 7/8 > 7/10   Subjective: Patient is somnolent at this time and does not answer questions.  He was recently received Ativan.  Family reports that his had a large bowel movement earlier today.  Objective: Vitals:   04/29/18 0237 04/29/18 0354 04/29/18 0839 04/29/18 1436  BP: (!) 156/84  (!) 151/71 (!) 155/82  Pulse: (!) 105  85 91  Resp: 20  18 20   Temp: 98.6 F (37 C)  98.9 F (37.2 C) 98.1 F (36.7 C)  TempSrc: Oral  Oral Oral  SpO2: 96% 91% 94% 95%  Weight:      Height:        Intake/Output Summary (Last 24 hours) at 04/29/2018 1952 Last data filed at 04/29/2018 1929 Gross per 24 hour  Intake 840 ml  Output 1250 ml  Net -410 ml   Filed Weights   04/25/18 1208 04/25/18 1702  Weight: 87.2 kg (192 lb 5 oz) 88.9 kg (196 lb)    Examination:  General exam: Somnolent, no distress Respiratory system: Crackles at bases. Respiratory effort normal. Cardiovascular system:RRR. No murmurs, rubs, gallops. Gastrointestinal system: Abdomen is nondistended, soft and nontender. No organomegaly or masses felt. Normal bowel sounds heard. Central nervous system: No focal neurological deficits. Extremities: No C/C/E, +pedal pulses Skin:  No rashes, lesions or ulcers Psychiatry: Somnolent, does not answer questions    Data Reviewed: I have personally reviewed following labs and imaging studies  CBC: Recent Labs  Lab 04/25/18 1222 04/26/18 0606 04/27/18 0612 04/28/18 0459 04/29/18 0454  WBC 19.7* 16.3* 16.8* 14.9* 12.1*  NEUTROABS 17.8* 14.2* 14.4* 12.1* 9.4*  HGB 15.8 14.7 13.5 12.5* 12.2*  HCT 48.0 45.3 42.4 39.4 38.6*  MCV 94.1 95.2 96.4 95.9 94.6  PLT 177 152  140* 146* 633   Basic Metabolic Panel: Recent Labs  Lab 04/25/18 1351 04/26/18 0606 04/27/18 0612 04/28/18 0459 04/29/18 0454  NA 139 144 144 145 146*  K 4.3 4.8 4.4 3.3* 2.9*  CL 105 109 109 110 109  CO2 24 29 27 29 27   GLUCOSE 224* 135* 148* 133* 120*  BUN 28* 22 25* 21 21  CREATININE 0.96 0.64 0.63 0.53* 0.61  CALCIUM 8.9 8.8* 8.6* 8.8* 9.1  MG  --  1.8 1.9 2.2  --    GFR: Estimated Creatinine Clearance: 94.3 mL/min (by C-G formula based on SCr of 0.61 mg/dL). Liver Function Tests: Recent Labs  Lab 04/25/18 1351 04/26/18 0606 04/27/18 0612 04/28/18 0459 04/29/18 0454  AST 262* 137* 52* 35 28  ALT 298* 249* 155* 106* 73*  ALKPHOS 56 64 56 54 53  BILITOT 4.2* 4.9* 2.4* 1.8* 1.8*  PROT 6.4* 6.4* 6.5 6.4* 6.2*  ALBUMIN 3.7 3.6 3.4* 3.1* 2.9*   Recent Labs  Lab 04/25/18 1351 04/26/18 0606 04/27/18 0612 04/28/18 0459  LIPASE 1,771* 825* 144* 48   No results for input(s): AMMONIA in the last 168 hours. Coagulation Profile: Recent Labs  Lab 04/26/18 1719  INR 1.16   Cardiac Enzymes: No results for input(s): CKTOTAL, CKMB, CKMBINDEX, TROPONINI in the last 168 hours. BNP (last 3 results) No results for input(s): PROBNP in the last 8760 hours. HbA1C: No results for input(s): HGBA1C in the last 72 hours. CBG: Recent Labs  Lab 04/28/18 1612 04/28/18 2037 04/29/18 0716 04/29/18 1114 04/29/18 1606  GLUCAP 87 78 121* 94 112*   Lipid Profile: Recent Labs    04/27/18 1654  TRIG 87   Thyroid Function Tests: No results for input(s): TSH, T4TOTAL, FREET4, T3FREE, THYROIDAB in the last 72 hours. Anemia Panel: No results for input(s): VITAMINB12, FOLATE, FERRITIN, TIBC, IRON, RETICCTPCT in the last 72 hours. Sepsis Labs: No results for input(s): PROCALCITON, LATICACIDVEN in the last 168 hours.  Recent Results (from the past 240 hour(s))  Culture, Urine     Status: None   Collection Time: 04/25/18 12:23 PM  Result Value Ref Range Status   Specimen  Description   Final    URINE, RANDOM Performed at [facility], [address], [place], [place] [address]    Special Requests   Final    NONE Performed at [facility], [address] [place], [place] [address]    Culture   Final    NO GROWTH Performed at [facility], [place] [address], [place],  [address]    Report Status 04/27/2018 FINAL  Final         Radiology Studies: No results found.      Scheduled Meds: . enoxaparin (LOVENOX) injection  40 mg Subcutaneous Q24H  . folic acid  1 mg Oral Daily  . insulin aspart  0-5 Units Subcutaneous QHS  . insulin aspart  0-9 Units Subcutaneous TID WC  . linaclotide  145 mcg Oral QAC breakfast  . [START ON 04/30/2018] LORazepam  0-4 mg Oral Q12H  . multivitamin with minerals  1 tablet Oral Daily  . thiamine  100 mg Oral Daily   Or  . thiamine  100 mg Intravenous Daily   Continuous Infusions: . sodium chloride 10 mL/hr at 04/29/18 1728  . famotidine (PEPCID) IV Stopped (04/29/18 0900)  . potassium chloride 10 mEq (04/29/18 1948)     LOS: 4 days    Time spent: 30 minutes    Kathie Dike, MD [facility] Pager [phone]  If 7PM-7AM, please contact night-coverage [email or web address] Password [email or web address] 04/29/2018, 7:52 PM

## 2018-04-30 DIAGNOSIS — K858 Other acute pancreatitis without necrosis or infection: Secondary | ICD-10-CM

## 2018-04-30 DIAGNOSIS — F1020 Alcohol dependence, uncomplicated: Secondary | ICD-10-CM

## 2018-04-30 LAB — CBC WITH DIFFERENTIAL/PLATELET
Basophils Absolute: 0 10*3/uL (ref 0.0–0.1)
Basophils Relative: 0 %
EOS PCT: 0 %
Eosinophils Absolute: 0 10*3/uL (ref 0.0–0.7)
HCT: 36.2 % — ABNORMAL LOW (ref 39.0–52.0)
Hemoglobin: 11.9 g/dL — ABNORMAL LOW (ref 13.0–17.0)
LYMPHS PCT: 13 %
Lymphs Abs: 1.5 10*3/uL (ref 0.7–4.0)
MCH: 30.3 pg (ref 26.0–34.0)
MCHC: 32.9 g/dL (ref 30.0–36.0)
MCV: 92.1 fL (ref 78.0–100.0)
MONO ABS: 1.5 10*3/uL — AB (ref 0.1–1.0)
MONOS PCT: 13 %
Neutro Abs: 8.8 10*3/uL — ABNORMAL HIGH (ref 1.7–7.7)
Neutrophils Relative %: 74 %
PLATELETS: 164 10*3/uL (ref 150–400)
RBC: 3.93 MIL/uL — ABNORMAL LOW (ref 4.22–5.81)
RDW: 13.1 % (ref 11.5–15.5)
WBC: 11.8 10*3/uL — ABNORMAL HIGH (ref 4.0–10.5)

## 2018-04-30 LAB — COMPREHENSIVE METABOLIC PANEL
ALBUMIN: 2.5 g/dL — AB (ref 3.5–5.0)
ALK PHOS: 59 U/L (ref 38–126)
ALT: 61 U/L — ABNORMAL HIGH (ref 0–44)
AST: 33 U/L (ref 15–41)
Anion gap: 6 (ref 5–15)
BILIRUBIN TOTAL: 1.8 mg/dL — AB (ref 0.3–1.2)
BUN: 17 mg/dL (ref 8–23)
CALCIUM: 8.7 mg/dL — AB (ref 8.9–10.3)
CO2: 29 mmol/L (ref 22–32)
Chloride: 109 mmol/L (ref 98–111)
Creatinine, Ser: 0.61 mg/dL (ref 0.61–1.24)
GFR calc Af Amer: >60 mL/min (ref >60)
GFR calc non Af Amer: >60 mL/min (ref >60)
GLUCOSE: 113 mg/dL — AB (ref 70–99)
POTASSIUM: 3.2 mmol/L — AB (ref 3.5–5.1)
SODIUM: 144 mmol/L (ref 135–145)
Total Protein: 5.5 g/dL — ABNORMAL LOW (ref 6.5–8.1)

## 2018-04-30 LAB — GLUCOSE, CAPILLARY
GLUCOSE-CAPILLARY: 140 mg/dL — AB (ref 70–99)
Glucose-Capillary: 94 mg/dL (ref 70–99)
Glucose-Capillary: 99 mg/dL (ref 70–99)
Glucose-Capillary: 107 mg/dL — ABNORMAL HIGH (ref 70–99)
Glucose-Capillary: 122 mg/dL — ABNORMAL HIGH (ref 70–99)

## 2018-04-30 MED ORDER — POTASSIUM CHLORIDE CRYS ER 20 MEQ PO TBCR
40.0000 meq | EXTENDED_RELEASE_TABLET | Freq: Once | ORAL | Status: AC
  Administered 2018-04-30: 40 meq via ORAL
  Filled 2018-04-30: qty 2

## 2018-04-30 NOTE — Progress Notes (Signed)
PROGRESS NOTE    Matthew Payne  KGY:[id number] DOB: 12-05-1945 DOA: 04/25/2018 PCP: Sharilyn Sites, MD    Brief Narrative:  72 year old male with a history of alcohol use, presents with abdominal pain.  Found to have evidence of pancreatitis.  Admitted for supportive management with IV fluids, pain management.  Also noted to have significantly elevated LFTs.  Further work-up in process.   Assessment & Plan:   Principal Problem:   Acute pancreatitis Active Problems:   Hypertension   Elevated lipase   Dehydration   Alcohol dependence, episodic drinking behavior (HCC)   Elevated d-dimer   Epigastric abdominal pain   Proteinuria   Leukocytosis   UTI (urinary tract infection)   Hyperlipidemia   Hyperglycemia   Abnormal transaminases   1. Acute alcoholic pancreatitis.  Patient admits to drinking alcohol regularly.  LFTs are noted to be elevated, but bile duct does not appear to be dilated.  Gallbladder was not visualized on imaging and is probably contracted.  Overall lipase is trending down and liver function tests also trending down.  Overall abdominal pain is better. Tolerating low fat diet.  GI following.   2. Elevated LFTs.  Bile duct is not dilated.  Gallbladder is not clearly visualized.  Hepatitis panel is negative.  LFTs are trending down continue to follow 3. Alcohol abuse with withdrawal.  On CIWA protocol.  Continues to be agitated with CIWA scores >10.  He is still requiring frequent ativan. Continue sitter precautions 4. Hypertension.  IV hydralazine as needed. Blood pressure stable 5. Diabetes.  Patient found to have an A1c of 6.5.  This may be falsely elevated in the setting of acute illness.  Currently on sliding scale insulin.  Blood sugars have been stable.  This will need further follow-up by primary care physician 6. Hyperlipidemia.  Statin currently on hold due to elevated LFTs. 7. Ileus.  Secondary to pancreatitis.  Improved with linzess.   DVT prophylaxis:  Lovenox Code Status: Full code Family Communication: discussed with wife and son at the bedside Disposition Plan: Discharge home once improved   Consultants:   Gastroenterology  Procedures:     Antimicrobials:   Ceftriaxone 7/8 > 7/10   Subjective: Continues to be tremulous and anxious requiring ativan. Tolerating solid food  Objective: Vitals:   04/29/18 1436 04/29/18 2012 04/29/18 2035 04/30/18 0204  BP: (!) 155/82  (!) 167/94 (!) 175/85  Pulse: 91  93 96  Resp: 20  20 18   Temp: 98.1 F (36.7 C)  100.1 F (37.8 C) 98.6 F (37 C)  TempSrc: Oral  Oral Oral  SpO2: 95% 92% 100% 100%  Weight:      Height:        Intake/Output Summary (Last 24 hours) at 04/30/2018 1347 Last data filed at 04/30/2018 0900 Gross per 24 hour  Intake 1222.42 ml  Output 700 ml  Net 522.42 ml   Filed Weights   04/25/18 1208 04/25/18 1702  Weight: 87.2 kg (192 lb 5 oz) 88.9 kg (196 lb)    Examination:  General exam: somnolent, wakes up to voice Respiratory system: Clear to auscultation. Respiratory effort normal. Cardiovascular system:RRR. No murmurs, rubs, gallops. Gastrointestinal system: Abdomen is nondistended, soft and nontender. No organomegaly or masses felt. Normal bowel sounds heard. Central nervous system: Alert and oriented. No focal neurological deficits. Extremities: trace edema bilaterally Skin: No rashes, lesions or ulcers Psychiatry: somnolent, confused      Data Reviewed: I have personally reviewed following labs and imaging studies  CBC: Recent Labs  Lab 04/26/18 0606 04/27/18 0612 04/28/18 0459 04/29/18 0454 04/30/18 0730  WBC 16.3* 16.8* 14.9* 12.1* 11.8*  NEUTROABS 14.2* 14.4* 12.1* 9.4* 8.8*  HGB 14.7 13.5 12.5* 12.2* 11.9*  HCT 45.3 42.4 39.4 38.6* 36.2*  MCV 95.2 96.4 95.9 94.6 92.1  PLT 152 140* 146* 178 025   Basic Metabolic Panel: Recent Labs  Lab 04/26/18 0606 04/27/18 0612 04/28/18 0459 04/29/18 0454 04/30/18 0730  NA 144 144 145  146* 144  K 4.8 4.4 3.3* 2.9* 3.2*  CL 109 109 110 109 109  CO2 29 27 29 27 29   GLUCOSE 135* 148* 133* 120* 113*  BUN 22 25* 21 21 17   CREATININE 0.64 0.63 0.53* 0.61 0.61  CALCIUM 8.8* 8.6* 8.8* 9.1 8.7*  MG 1.8 1.9 2.2  --   --    GFR: Estimated Creatinine Clearance: 94.3 mL/min (by C-G formula based on SCr of 0.61 mg/dL). Liver Function Tests: Recent Labs  Lab 04/26/18 0606 04/27/18 0612 04/28/18 0459 04/29/18 0454 04/30/18 0730  AST 137* 52* 35 28 33  ALT 249* 155* 106* 73* 61*  ALKPHOS 64 56 54 53 59  BILITOT 4.9* 2.4* 1.8* 1.8* 1.8*  PROT 6.4* 6.5 6.4* 6.2* 5.5*  ALBUMIN 3.6 3.4* 3.1* 2.9* 2.5*   Recent Labs  Lab 04/25/18 1351 04/26/18 0606 04/27/18 0612 04/28/18 0459  LIPASE 1,771* 825* 144* 48   No results for input(s): AMMONIA in the last 168 hours. Coagulation Profile: Recent Labs  Lab 04/26/18 1719  INR 1.16   Cardiac Enzymes: No results for input(s): CKTOTAL, CKMB, CKMBINDEX, TROPONINI in the last 168 hours. BNP (last 3 results) No results for input(s): PROBNP in the last 8760 hours. HbA1C: No results for input(s): HGBA1C in the last 72 hours. CBG: Recent Labs  Lab 04/29/18 1114 04/29/18 1606 04/30/18 0623 04/30/18 0746 04/30/18 1125  GLUCAP 94 112* 94 99 107*   Lipid Profile: Recent Labs    04/27/18 1654  TRIG 87   Thyroid Function Tests: No results for input(s): TSH, T4TOTAL, FREET4, T3FREE, THYROIDAB in the last 72 hours. Anemia Panel: No results for input(s): VITAMINB12, FOLATE, FERRITIN, TIBC, IRON, RETICCTPCT in the last 72 hours. Sepsis Labs: No results for input(s): PROCALCITON, LATICACIDVEN in the last 168 hours.  Recent Results (from the past 240 hour(s))  Culture, Urine     Status: None   Collection Time: 04/25/18 12:23 PM  Result Value Ref Range Status   Specimen Description   Final    URINE, RANDOM Performed at [facility], [address], [place], [place] [address]    Special Requests   Final    NONE Performed  at [facility], [address], [place], [place] [address]    Culture   Final    NO GROWTH Performed at [facility], [place] [address] [place],  [address]    Report Status 04/27/2018 FINAL  Final         Radiology Studies: No results found.      Scheduled Meds: . enoxaparin (LOVENOX) injection  40 mg Subcutaneous Q24H  . folic acid  1 mg Oral Daily  . insulin aspart  0-5 Units Subcutaneous QHS  . insulin aspart  0-9 Units Subcutaneous TID WC  . linaclotide  145 mcg Oral QAC breakfast  . LORazepam  0-4 mg Oral Q12H  . multivitamin with minerals  1 tablet Oral Daily  . thiamine  100 mg Oral Daily   Or  . thiamine  100 mg Intravenous Daily   Continuous Infusions: . sodium chloride 75 mL/hr at 04/30/18 0005  . sodium chloride 10 mL/hr at 04/29/18 1728  . famotidine (PEPCID) IV Stopped (04/30/18 1240)     LOS: 5 days    Time spent: 30 minutes    Kathie Dike, MD [facility] Pager [phone]  If 7PM-7AM, please contact night-coverage [email or web address] Password [email or web address] 04/30/2018, 1:47 PM

## 2018-04-30 NOTE — Progress Notes (Signed)
Awake. Conversant. Denies abdominal pain. States is pretty much eating a regular diet (corroborated by nursing staff)  Vital signs in last 24 hours: Temp:  [98.1 F (36.7 C)-100.1 F (37.8 C)] 98.6 F (37 C) (07/13 0204) Pulse Rate:  [91-96] 96 (07/13 0204) Resp:  [18-20] 18 (07/13 0204) BP: (155-175)/(82-94) 175/85 (07/13 0204) SpO2:  [92 %-100 %] 100 % (07/13 0204) Last BM Date: 04/27/18 General:  pleasant and cooperative in NAD Abdomen:  4. Positive bowel sounds. Soft and nontender Extremities:  Without clubbing or edema.    Intake/Output from previous day: 07/12 0701 - 07/13 0700 In: 1462.4 [P.O.:920; I.V.:370.8; IV Piggyback:171.7] Out: 950 [Urine:950] Intake/Output this shift: No intake/output data recorded.  Lab Results: Recent Labs    04/28/18 0459 04/29/18 0454 04/30/18 0730  WBC 14.9* 12.1* 11.8*  HGB 12.5* 12.2* 11.9*  HCT 39.4 38.6* 36.2*  PLT 146* 178 164   BMET Recent Labs    04/28/18 0459 04/29/18 0454 04/30/18 0730  NA 145 146* 144  K 3.3* 2.9* 3.2*  CL 110 109 109  CO2 29 27 29   GLUCOSE 133* 120* 113*  BUN 21 21 17   CREATININE 0.53* 0.61 0.61  CALCIUM 8.8* 9.1 8.7*   LFT Recent Labs    04/30/18 0730  PROT 5.5*  ALBUMIN 2.5*  AST 33  ALT 61*  ALKPHOS 59  BILITOT 1.8*   PT/INR No results for input(s): LABPROT, INR in the last 72 hours. Hepatitis Panel No results for input(s): HEPBSAG, HCVAB, HEPAIGM, HEPBIGM in the last 72 hours. C-Diff No results for input(s): CDIFFTOX in the last 72 hours.  Studies/Results: No results found.  Assessment: Principal Problem:   Acute pancreatitis Active Problems:   Hypertension   Elevated lipase   Dehydration   Alcohol dependence, episodic drinking behavior (HCC)   Elevated d-dimer   Epigastric abdominal pain   Proteinuria   Leukocytosis   UTI (urinary tract infection)   Hyperlipidemia   Hyperglycemia   Abnormal transaminases  Acute, uncomplicated pancreatitis likely on the basis of  alcohol exposure-much improved.  I suspect an element of alcohol-induced hepatitis although enzyme pattern not typical.  Viral markers negative.  Recommendations:  Continue supportive measures.  Follow LFTs down to normal. Outpatient follow-up with Dr. Laural Golden - to be scheduled        LOS: 5 days   Manus Rudd  04/30/2018, 9:46 AM

## 2018-05-01 LAB — GLUCOSE, CAPILLARY
GLUCOSE-CAPILLARY: 150 mg/dL — AB (ref 70–99)
Glucose-Capillary: 186 mg/dL — ABNORMAL HIGH (ref 70–99)
Glucose-Capillary: 158 mg/dL — ABNORMAL HIGH (ref 70–99)
Glucose-Capillary: 162 mg/dL — ABNORMAL HIGH (ref 70–99)

## 2018-05-01 LAB — COMPREHENSIVE METABOLIC PANEL
ALT: 92 U/L — AB (ref 0–44)
AST: 58 U/L — ABNORMAL HIGH (ref 15–41)
Albumin: 2.4 g/dL — ABNORMAL LOW (ref 3.5–5.0)
Alkaline Phosphatase: 79 U/L (ref 38–126)
Anion gap: 7 (ref 5–15)
BUN: 14 mg/dL (ref 8–23)
CALCIUM: 9.2 mg/dL (ref 8.9–10.3)
CO2: 27 mmol/L (ref 22–32)
CREATININE: 0.71 mg/dL (ref 0.61–1.24)
Chloride: 106 mmol/L (ref 98–111)
GLUCOSE: 174 mg/dL — AB (ref 70–99)
Potassium: 3.2 mmol/L — ABNORMAL LOW (ref 3.5–5.1)
Sodium: 140 mmol/L (ref 135–145)
Total Bilirubin: 1.4 mg/dL — ABNORMAL HIGH (ref 0.3–1.2)
Total Protein: 6.1 g/dL — ABNORMAL LOW (ref 6.5–8.1)

## 2018-05-01 NOTE — Progress Notes (Signed)
PROGRESS NOTE    Matthew Payne  QAS:[id number] DOB: 1945/11/18 DOA: 04/25/2018 PCP: Sharilyn Sites, MD    Brief Narrative:  72 year old male with a history of alcohol use, presents with abdominal pain.  Found to have evidence of pancreatitis.  Admitted for supportive management with IV fluids, pain management.  Also noted to have significantly elevated LFTs.  Further work-up in process.   Assessment & Plan:   Principal Problem:   Acute pancreatitis Active Problems:   Hypertension   Elevated lipase   Dehydration   Alcohol dependence, episodic drinking behavior (HCC)   Elevated d-dimer   Epigastric abdominal pain   Proteinuria   Leukocytosis   UTI (urinary tract infection)   Hyperlipidemia   Hyperglycemia   Abnormal transaminases   1. Acute alcoholic pancreatitis.  Patient admits to drinking alcohol regularly.  LFTs are noted to be elevated, but bile duct does not appear to be dilated.  Gallbladder was not visualized on imaging and is probably contracted.  Overall lipase is trending down and liver function tests also trending down.  Overall abdominal pain is better. Tolerating low fat diet.  GI following.   2. Elevated LFTs.  Bile duct is not dilated.  Gallbladder is not clearly visualized.  Hepatitis panel is negative.  LFTs are trending down continue to follow 3. Alcohol abuse with withdrawal.  On CIWA protocol.  Continues to be agitated with high CIWA scores.  He is still requiring frequent ativan. Continue sitter precautions 4. Hypertension.  IV hydralazine as needed. Blood pressure stable 5. Diabetes.  Patient found to have an A1c of 6.5.  This may be falsely elevated in the setting of acute illness.  Currently on sliding scale insulin.  Blood sugars have been stable.  This will need further follow-up by primary care physician 6. Hyperlipidemia.  Statin currently on hold due to elevated LFTs. 7. Ileus.  Secondary to pancreatitis.  Improved with linzess.   DVT prophylaxis:  Lovenox Code Status: Full code Family Communication: discussed with wife and son at the bedside Disposition Plan: Discharge home once improved   Consultants:   Gastroenterology  Procedures:     Antimicrobials:   Ceftriaxone 7/8 > 7/10   Subjective: Abdominal pain is better. Still somnolent. Still having episodes of agitation and tremors requiring ativan  Objective: Vitals:   05/01/18 0223 05/01/18 0225 05/01/18 0342 05/01/18 0807  BP:  (!) 185/94 123/63 139/73  Pulse: 67 93  88  Resp: 18 18  18   Temp: 98.4 F (36.9 C) 98.4 F (36.9 C)  99.1 F (37.3 C)  TempSrc: Axillary Oral  Oral  SpO2:  97%  95%  Weight:      Height:        Intake/Output Summary (Last 24 hours) at 05/01/2018 1635 Last data filed at 05/01/2018 1400 Gross per 24 hour  Intake 600 ml  Output 900 ml  Net -300 ml   Filed Weights   04/25/18 1208 04/25/18 1702  Weight: 87.2 kg (192 lb 5 oz) 88.9 kg (196 lb)    Examination:  General exam: somnolent, no distress Respiratory system: Clear to auscultation. Respiratory effort normal. Cardiovascular system:RRR. No murmurs, rubs, gallops. Gastrointestinal system: Abdomen is nondistended, soft and nontender. No organomegaly or masses felt. Normal bowel sounds heard. Central nervous system: Alert and oriented. No focal neurological deficits. Extremities: No C/C/E, +pedal pulses Skin: No rashes, lesions or ulcers Psychiatry: somnolent, mildly confused   Data Reviewed: I have personally reviewed following labs and imaging studies  CBC:  Recent Labs  Lab 04/26/18 0606 04/27/18 0612 04/28/18 0459 04/29/18 0454 04/30/18 0730  WBC 16.3* 16.8* 14.9* 12.1* 11.8*  NEUTROABS 14.2* 14.4* 12.1* 9.4* 8.8*  HGB 14.7 13.5 12.5* 12.2* 11.9*  HCT 45.3 42.4 39.4 38.6* 36.2*  MCV 95.2 96.4 95.9 94.6 92.1  PLT 152 140* 146* 178 517   Basic Metabolic Panel: Recent Labs  Lab 04/26/18 0606 04/27/18 0612 04/28/18 0459 04/29/18 0454 04/30/18 0730  NA 144  144 145 146* 144  K 4.8 4.4 3.3* 2.9* 3.2*  CL 109 109 110 109 109  CO2 29 27 29 27 29   GLUCOSE 135* 148* 133* 120* 113*  BUN 22 25* 21 21 17   CREATININE 0.64 0.63 0.53* 0.61 0.61  CALCIUM 8.8* 8.6* 8.8* 9.1 8.7*  MG 1.8 1.9 2.2  --   --    GFR: Estimated Creatinine Clearance: 94.3 mL/min (by C-G formula based on SCr of 0.61 mg/dL). Liver Function Tests: Recent Labs  Lab 04/26/18 0606 04/27/18 0612 04/28/18 0459 04/29/18 0454 04/30/18 0730  AST 137* 52* 35 28 33  ALT 249* 155* 106* 73* 61*  ALKPHOS 64 56 54 53 59  BILITOT 4.9* 2.4* 1.8* 1.8* 1.8*  PROT 6.4* 6.5 6.4* 6.2* 5.5*  ALBUMIN 3.6 3.4* 3.1* 2.9* 2.5*   Recent Labs  Lab 04/25/18 1351 04/26/18 0606 04/27/18 0612 04/28/18 0459  LIPASE 1,771* 825* 144* 48   No results for input(s): AMMONIA in the last 168 hours. Coagulation Profile: Recent Labs  Lab 04/26/18 1719  INR 1.16   Cardiac Enzymes: No results for input(s): CKTOTAL, CKMB, CKMBINDEX, TROPONINI in the last 168 hours. BNP (last 3 results) No results for input(s): PROBNP in the last 8760 hours. HbA1C: No results for input(s): HGBA1C in the last 72 hours. CBG: Recent Labs  Lab 04/30/18 1125 04/30/18 1646 04/30/18 2051 05/01/18 0754 05/01/18 1207  GLUCAP 107* 122* 140* 186* 150*   Lipid Profile: No results for input(s): CHOL, HDL, LDLCALC, TRIG, CHOLHDL, LDLDIRECT in the last 72 hours. Thyroid Function Tests: No results for input(s): TSH, T4TOTAL, FREET4, T3FREE, THYROIDAB in the last 72 hours. Anemia Panel: No results for input(s): VITAMINB12, FOLATE, FERRITIN, TIBC, IRON, RETICCTPCT in the last 72 hours. Sepsis Labs: No results for input(s): PROCALCITON, LATICACIDVEN in the last 168 hours.  Recent Results (from the past 240 hour(s))  Culture, Urine     Status: None   Collection Time: 04/25/18 12:23 PM  Result Value Ref Range Status   Specimen Description   Final    URINE, RANDOM Performed at [facility], [address],  [place], [place] [address]    Special Requests   Final    NONE Performed at [facility], [address] [place], [place] [address]    Culture   Final    NO GROWTH Performed at [facility], [place] [address], [place], [place] [address]    Report Status 04/27/2018 FINAL  Final         Radiology Studies: No results found.      Scheduled Meds: . enoxaparin (LOVENOX) injection  40 mg Subcutaneous Q24H  . folic acid  1 mg Oral Daily  . insulin aspart  0-5 Units Subcutaneous QHS  . insulin aspart  0-9 Units Subcutaneous TID WC  . linaclotide  145 mcg Oral QAC breakfast  . LORazepam  0-4 mg Oral Q12H  . multivitamin with minerals  1 tablet Oral Daily  . thiamine  100 mg Oral Daily   Or  .  thiamine  100 mg Intravenous Daily   Continuous Infusions: . sodium chloride 10 mL/hr at 04/29/18 1728  . famotidine (PEPCID) IV 20 mg (05/01/18 0814)     LOS: 6 days    Time spent: 30 minutes    Kathie Dike, MD [facility] Pager [phone]  If 7PM-7AM, please contact night-coverage [email or web address] Password [facility] 05/01/2018, 4:35 PM

## 2018-05-02 ENCOUNTER — Inpatient Hospital Stay (HOSPITAL_COMMUNITY): Payer: Medicare HMO

## 2018-05-02 LAB — COMPREHENSIVE METABOLIC PANEL
ALBUMIN: 2.3 g/dL — AB (ref 3.5–5.0)
ALT: 85 U/L — ABNORMAL HIGH (ref 0–44)
ANION GAP: 9 (ref 5–15)
AST: 43 U/L — AB (ref 15–41)
Alkaline Phosphatase: 71 U/L (ref 38–126)
BUN: 13 mg/dL (ref 8–23)
CHLORIDE: 105 mmol/L (ref 98–111)
CO2: 26 mmol/L (ref 22–32)
Calcium: 8.8 mg/dL — ABNORMAL LOW (ref 8.9–10.3)
Creatinine, Ser: 0.61 mg/dL (ref 0.61–1.24)
GFR calc Af Amer: >60 mL/min (ref >60)
Glucose, Bld: 130 mg/dL — ABNORMAL HIGH (ref 70–99)
POTASSIUM: 3.2 mmol/L — AB (ref 3.5–5.1)
Sodium: 140 mmol/L (ref 135–145)
Total Bilirubin: 1.2 mg/dL (ref 0.3–1.2)
Total Protein: 5.7 g/dL — ABNORMAL LOW (ref 6.5–8.1)

## 2018-05-02 LAB — CBC
HEMATOCRIT: 38.7 % — AB (ref 39.0–52.0)
HEMOGLOBIN: 13 g/dL (ref 13.0–17.0)
MCH: 30.7 pg (ref 26.0–34.0)
MCHC: 33.6 g/dL (ref 30.0–36.0)
MCV: 91.5 fL (ref 78.0–100.0)
Platelets: 228 10*3/uL (ref 150–400)
RBC: 4.23 MIL/uL (ref 4.22–5.81)
RDW: 13.6 % (ref 11.5–15.5)
WBC: 12.8 10*3/uL — AB (ref 4.0–10.5)

## 2018-05-02 LAB — CREATININE, SERUM
Creatinine, Ser: 0.62 mg/dL (ref 0.61–1.24)
GFR calc Af Amer: >60 mL/min (ref >60)
GFR calc non Af Amer: >60 mL/min (ref >60)

## 2018-05-02 LAB — GLUCOSE, CAPILLARY
GLUCOSE-CAPILLARY: 123 mg/dL — AB (ref 70–99)
GLUCOSE-CAPILLARY: 178 mg/dL — AB (ref 70–99)

## 2018-05-02 LAB — MAGNESIUM: MAGNESIUM: 1.9 mg/dL (ref 1.7–2.4)

## 2018-05-02 MED ORDER — THIAMINE HCL 100 MG PO TABS: 100.0000 mg | ORAL_TABLET | Freq: Every day | ORAL | Status: DC

## 2018-05-02 MED ORDER — FOLIC ACID 1 MG PO TABS: 1.0000 mg | ORAL_TABLET | Freq: Every day | ORAL | Status: DC

## 2018-05-02 MED ORDER — LINACLOTIDE 145 MCG PO CAPS: 145.0000 ug | ORAL_CAPSULE | Freq: Every day | ORAL | 0 refills | Status: DC

## 2018-05-02 MED ORDER — POTASSIUM CHLORIDE CRYS ER 20 MEQ PO TBCR: 60.0000 meq | EXTENDED_RELEASE_TABLET | Freq: Once | ORAL | Status: DC

## 2018-05-02 NOTE — Progress Notes (Signed)
05/02/18  Spoke with patient's wife. She expressed reservations about having patient at home due to her personal health. She states that if he falls she is unable to pick him up. Care management has offered rehab and she refused as she would have to pay out of pocket. Care management then offered home health and she refused to have anyone come to her home.   I also offered to have home health come to care for her husband. Patient's wife also refused to have this service. She was adamant that she did not want him at home.   I informed her that Aldo ambulated in the hallway by his nurse and had no loss of balance or gate problems and that we had no medical reason to keep him. The patient was agitated and said she would take him home.

## 2018-05-02 NOTE — Care Management Note (Signed)
Case Management Note  Patient Details  Name: Matthew Payne MRN: [id number] Date of Birth: November 27, 1945  Subjective/Objective:  Admitted with ETOH induced pancreatitis and has experienced ETOH withdrawal while hospitalized. Pt is from home and very ind pta. She has no DME pta. Pt has ambulated in the hallway approximately 77ft with standby assist of NT. Per NT and RN pt was not unsteady during ambulation and required no assistance. He used no DME surfaces for support.                  Action/Plan: DC home today with self care. Wife and son at bedside, wife asks that pt not be discharged home and instead go to [facility] for rehab. Wife states she has had back surgery and can not lift more than 10 lbs. CM dicussed with wife the report given by RN and NT in regards to pt's ambulation. CM explained that [employer] would have to authorize STR and it is very unlikely that they will feel pt is appropriate for skilled level of care. CM gave wife the option to pay privately but made her aware that the [facility] may not be able to accept pt and there is a process for determining which facilities would be an option for patient to go to. Wife was very upset about this. CM offered to get [facility] or walker or any other DME if that would ease her mind about pt returning home, [facility] services also offered. Wife refused any DME and any services. Pt resting comfortably in bed, eyes open, he does not contribute to the conversation. Conversation ends with wife requesting that we "hurry up and get his stuff ready". CM discussed conversation with RN.    Expected Discharge Date:  05/02/18               Expected Discharge Plan:  Home/Self Care  In-House Referral:  NA  Discharge planning Services  CM Consult  Post Acute Care Choice:  NA Choice offered to:  NA  Status of Service:  Completed, signed off  Sherald Barge, RN 05/02/2018, 11:50 AM

## 2018-05-02 NOTE — Progress Notes (Signed)
Patient ambulated in hallway times one assist.

## 2018-05-02 NOTE — Care Management Important Message (Signed)
Important Message  Patient Details  Name: MACKIE HOLNESS MRN: [id number] Date of Birth: 09-10-1946   Medicare Important Message Given:  Yes    [name] 05/02/2018, 11:39 AM

## 2018-05-02 NOTE — Progress Notes (Signed)
Discharge instructions given on medications and follow up visits, patient and family verbalized understanding. IV discontinued,catheter intact. Accompanied by staff to an awaiting vehicle.

## 2018-05-02 NOTE — Discharge Summary (Signed)
Physician Discharge Summary  Matthew Payne PPI:[id number] DOB: 09-Apr-1946 DOA: 04/25/2018  PCP: Sharilyn Sites, MD GI: Rehman  Admit date: 04/25/2018 Discharge date: 05/02/2018  Admitted From: Home  Disposition: Home  Recommendations for Outpatient Follow-up:  1. Follow up with PCP in 1 weeks 2. Follow up with Dr. Laural Golden in 3 weeks 3. Please obtain CMP/CBC in 1-2 weeks 4. Please follow up A1c of 6.5% and start treatment for Diabetes if needed.   5. Discuss restarting STATIN with PCP and GI on outpatient follow up  Discharge Condition: STABLE   CODE STATUS: FULL    Brief Hospitalization Summary: Please see all hospital notes, images, labs for full details of the hospitalization. HPI: Matthew Payne is a 72 y.o. male episodic alcohol drinker presented to ED complaining of acute onset severe episodic abdominal pain that started approximately 12 hours prior to arrival.  He has had constant nausea and vomiting since that time and unable to keep down food or drink.  His abdominal pain is epigastric, sharp, non-radiating severe pain that is relieved mostly by not eating or drinking.  The patient has also had multiple loose stools and frequent urinations.  He denies chest pain but does report occasional shortness of breath associated with vomiting.  He reports that he had a similar episode about 1 year ago and he came to ER but was diagnosed with acute gastroenteritis and the symptoms resolved.  A lipase test was normal at that time.  Eating and drinking makes abdominal pain, nausea and vomiting worse and more severe.  He denies fever and chills.    ED Course:  The patient arrived in severe pain.  He was noted to have an elevated d-dimer and WBC. His lipase was 1700.   He had a blood sugar of 224.  His liver enzymes were elevated with an ALT of 298.  He had a leukocytosis with WBC of 19.7  He had a CTA chest abd/pelvis done that did not reveal a PE. There was no aortic dissection. There was marked  pancreatitis seen.  The patient had an abnormal urinalysis with 11-20 WBC seen.  The patient is being admitted for further management of acute pancreatitis.     1. Acute alcoholic pancreatitis.  Patient admits to drinking alcohol regularly.  LFTs are noted to be elevated, but bile duct does not appear to be dilated.  Gallbladder was not visualized on imaging and is probably contracted.  Overall lipase is trending down and liver function tests also trending down.  Overall abdominal pain is better. Tolerating low fat diet.  GI following.   2. Elevated LFTs.  Improving.  Likely secondary to chronic alcoholism.  Bile duct is not dilated.  Gallbladder is not clearly visualized.  Hepatitis panel is negative.  LFTs are trending down continue to follow 3. Alcohol abuse with withdrawal.  Pt was treated with CIWA protocol and sitter.  He is much better now and wanting to go home.  He is alert and oriented.  He has been counseled and advised to please AVOID ALCOHOL. He verbalized understanding.  Literature also given to patient for treatment and education.  He verbalized understanding.     4. Hypertension.  Resume home enalapril.  Blood pressure stable 5. Diabetes.  Patient found to have an A1c of 6.5.  This may be falsely elevated in the setting of acute illness.  Currently on sliding scale insulin.  Blood sugars have been stable.  This will need further follow-up by primary care physician.  For now, diet and exercise advised and follow up with PCP for recheck recommended.  6. Hyperlipidemia.  Statin currently on hold due to elevated LFTs. 7. Ileus.  Resolved.  Secondary to pancreatitis.  Improved with linzess.  DVT prophylaxis: Lovenox Code Status: Full code Family Communication: discussed with wife and son Disposition Plan: Discharge home   Consultants:   Gastroenterology  Procedures:     Antimicrobials:   Ceftriaxone 7/8 > 7/10  Discharge Diagnoses:  Principal Problem:   Acute  pancreatitis Active Problems:   Hypertension   Elevated lipase   Dehydration   Alcohol dependence, episodic drinking behavior (HCC)   Elevated d-dimer   Epigastric abdominal pain   Proteinuria   Leukocytosis   UTI (urinary tract infection)   Hyperlipidemia   Hyperglycemia   Abnormal transaminases  Discharge Instructions: Discharge Instructions    Call MD for:  difficulty breathing, headache or visual disturbances   Complete by:  As directed    Call MD for:  extreme fatigue   Complete by:  As directed    Call MD for:  persistant dizziness or light-headedness   Complete by:  As directed    Call MD for:  persistant nausea and vomiting   Complete by:  As directed    Call MD for:  severe uncontrolled pain   Complete by:  As directed    Increase activity slowly   Complete by:  As directed      Allergies as of 05/02/2018   No Known Allergies     Medication List    STOP taking these medications   ALEVE 220 MG tablet Generic drug:  naproxen sodium   diphenhydramine-acetaminophen 25-500 MG Tabs tablet Commonly known as:  TYLENOL PM   pravastatin 40 MG tablet Commonly known as:  PRAVACHOL     TAKE these medications   EMERGEN-C VITAMIN C Chew Chew 1 tablet by mouth daily.   enalapril 10 MG tablet Commonly known as:  VASOTEC Take 10 mg by mouth daily.   folic acid 1 MG tablet Commonly known as:  FOLVITE Take 1 tablet (1 mg total) by mouth daily. Start taking on:  05/03/2018   linaclotide 145 MCG Caps capsule Commonly known as:  LINZESS Take 1 capsule (145 mcg total) by mouth daily before breakfast. Start taking on:  05/03/2018   thiamine 100 MG tablet Take 1 tablet (100 mg total) by mouth daily. Start taking on:  05/03/2018      Follow-up Information    Sharilyn Sites, MD. Schedule an appointment as soon as possible for a visit in 1 week(s).   Specialty:  Family Medicine Contact information: [address] [place] [address] [phone]         [name], MD. Schedule an appointment as soon as possible for a visit in 3 week(s).   Specialty:  Gastroenterology Contact information: [place], [address] [place] [address] [phone]          No Known Allergies Allergies as of 05/02/2018   No Known Allergies     Medication List    STOP taking these medications   ALEVE 220 MG tablet Generic drug:  naproxen sodium   diphenhydramine-acetaminophen 25-500 MG Tabs tablet Commonly known as:  TYLENOL PM   pravastatin 40 MG tablet Commonly known as:  PRAVACHOL     TAKE these medications   EMERGEN-C VITAMIN C Chew Chew 1 tablet by mouth daily.   enalapril 10 MG tablet Commonly known as:  VASOTEC Take  10 mg by mouth daily.   folic acid 1 MG tablet Commonly known as:  FOLVITE Take 1 tablet (1 mg total) by mouth daily. Start taking on:  05/03/2018   linaclotide 145 MCG Caps capsule Commonly known as:  LINZESS Take 1 capsule (145 mcg total) by mouth daily before breakfast. Start taking on:  05/03/2018   thiamine 100 MG tablet Take 1 tablet (100 mg total) by mouth daily. Start taking on:  05/03/2018       Procedures/Studies: Dg Chest Port 1 View  Result Date: 05/02/2018 CLINICAL DATA:  Leukocytosis, history hypertension EXAM: PORTABLE CHEST 1 VIEW COMPARISON:  04/25/2018 FINDINGS: Very low lung volumes. Stable heart size and mediastinal contours. Crowding of perihilar markings. Bibasilar atelectasis and question small LEFT pleural effusion. Upper lungs clear. Bones unremarkable. IMPRESSION: Very low lung volumes with bibasilar atelectasis and question small LEFT pleural effusion Electronically Signed   By: Lavonia Dana M.D.   On: 05/02/2018 09:57   Dg Abd 2 Views  Result Date: 04/27/2018 CLINICAL DATA:  Abdominal distention.  Admitted with pancreatitis EXAM: ABDOMEN - 2 VIEW COMPARISON:  04/25/2018 FINDINGS: Gas dilated small bowel, proximal colon, and stomach. This is likely reactive ileus in the setting  of pancreatitis. The duodenal C-loop appears widened with fold thickening that is attributed to pancreatic edema. Atelectasis at the bases. IMPRESSION: 1. Ileus. 2. Deformed duodenal C-loop attributed to pancreatitis. 3. Atelectasis at the bases. Electronically Signed   By: Monte Fantasia M.D.   On: 04/27/2018 19:26   Dg Abd Acute W/chest  Result Date: 04/25/2018 CLINICAL DATA:  Mid to upper abdominal pain EXAM: DG ABDOMEN ACUTE W/ 1V CHEST COMPARISON:  Abdominal and pelvic CT scan of June 22, 2017 FINDINGS: The lungs are mildly hypoinflated. There is no focal infiltrate. There is no pleural effusion. The heart and pulmonary vascularity are normal. There is multilevel degenerative disc disease of the thoracic spine. Within the abdomen there are multiple loops of mildly distended gas and fluid-filled small bowel in the mid and right abdomen. The colon is largely collapsed. There is some stool in the descending colon and rectum. The bony structures exhibit no acute abnormalities. IMPRESSION: Bowel gas pattern suggests a small bowel ileus or early partial mid to distal obstruction. No evidence of perforation. No acute cardiopulmonary abnormality. Electronically Signed   By: David  Martinique M.D.   On: 04/25/2018 13:15   Ct Angio Chest/abd/pel For Dissection W And/or Wo Contrast  Result Date: 04/25/2018 CLINICAL DATA:  Abdominal pain which started last night with nausea. Pain persistent since October. Concern for aortic dissection. EXAM: CT ANGIOGRAPHY CHEST, ABDOMEN AND PELVIS TECHNIQUE: Multidetector CT imaging through the chest, abdomen and pelvis was performed using the standard protocol during bolus administration of intravenous contrast. Multiplanar reconstructed images and MIPs were obtained and reviewed to evaluate the vascular anatomy. CONTRAST:  171mL ISOVUE-370 IOPAMIDOL (ISOVUE-370) INJECTION 76% COMPARISON:  None. FINDINGS: CTA CHEST FINDINGS Cardiovascular: Non IV contrast series demonstrates no  intramural hematoma within the thoracic aorta. Contrast enhanced imaging demonstrates no evidence of aortic dissection or aneurysm. No central pulmonary embolism. Mediastinum/Nodes: No axillary or supraclavicular lymphadenopathy. No mediastinal hilar adenopathy. Esophagus normal. Lungs/Pleura: Linear atelectasis at the lung bases. Musculoskeletal: No aggressive osseous lesion Review of the MIP images confirms the above findings. CTA ABDOMEN AND PELVIS FINDINGS VASCULAR Aorta: No aortic dissection or aneurysm. Celiac: Widely patent.  No vascular complication SMA: Widely patent.  No vascular complication Renals: Single renal arteries are patent. IMA: Patent Inflow: Normal Veins:  No Review of the MIP images confirms the above findings. NON-VASCULAR Hepatobiliary: Low-attenuation within liver consistent hepatic steatosis. Postcholecystectomy. No biliary duct dilatation. Common bile duct normal caliber. Pancreas: Extensive mild inflammation involving the head, body and tail ofthe pancreas. There is fluid along the LEFT and RIGHT anterior pararenal fascia in a pattern typical of acute pancreatitis. There is more fluid on the RIGHT than the LEFT. No pancreatic duct dilatation. No organized fluid collections. Spleen: Normal spleen Adrenals/urinary tract: Adrenal glands and kidneys are normal. The ureters and bladder normal. Stomach/Bowel: Stomach is normal. Fluid along the second portion the duodenum presumably related to pancreatitis. The small bowel is normal. Appendix is normal. Colon is collapsed. Vascular/Lymphatic: No periportal or retroperitoneal adenopathy. No pelvic adenopathy. Reproductive: Prostate is enlarged measuring 56 mm in diameter Other: Under Musculoskeletal: No aggressive osseous lesion. Review of the MIP images confirms the above findings. IMPRESSION: Chest Impression: 1. No aortic dissection or aneurysm. 2. Mild linear bibasilar atelectasis. Abdomen / Pelvis Impression: 1. Inflammation and fluid in the  upper abdomen in a pattern typical of acute pancreatitis. No organized fluid collections associated with the pancreas. No evidence of pancreas necrosis. 2. Low-attenuation liver consistent with hepatic steatosis. No biliary duct dilatation. 3. No vascular complication associated with acute pancreatitis. No aortic aneurysm or dissection. Electronically Signed   By: Suzy Bouchard M.D.   On: 04/25/2018 15:55   US Abdomen Limited Ruq  Result Date: 04/26/2018 CLINICAL DATA:  Acute pancreatitis, epigastric pain for 2 days EXAM: ULTRASOUND ABDOMEN LIMITED RIGHT UPPER QUADRANT COMPARISON:  CT abdomen and pelvis of 04/25/2017 FINDINGS: Gallbladder: The gallbladder is not definitely visualized. In review of yesterday CT there is no evidence by surgical clips of prior cholecystectomy the gallbladder could be significantly contracted. No calcified gallstones are evident. Common bile duct: Diameter: The common bile duct is normal measuring 2.4 mm. Liver: The liver parenchyma is echogenic and inhomogeneous consistent with hepatic steatosis. No focal hepatic abnormality is seen. Portal vein flow is patent and in the normal direction. IMPRESSION: 1. Echogenic and inhomogeneous liver parenchyma consistent with hepatic steatosis. 2. The gallbladder is not visualized and may be either significantly contracted or previously resected. Correlate with surgical history. Electronically Signed   By: Ivar Drape M.D.   On: 04/26/2018 10:59      Subjective: Pt feels a lot better, he is mentally alert and oriented and he really wants to go home today.  He denies complaints.   Discharge Exam: Vitals:   05/01/18 2216 05/02/18 0434  BP:  (!) 159/78  Pulse:  86  Resp:  18  Temp:  98.4 F (36.9 C)  SpO2: 95% 95%   Vitals:   05/01/18 1703 05/01/18 1903 05/01/18 2216 05/02/18 0434  BP: (!) 163/77 (!) 158/79  (!) 159/78  Pulse: 87 89  86  Resp: 20 18  18   Temp: 98.6 F (37 C) 98.4 F (36.9 C)  98.4 F (36.9 C)  TempSrc:  Oral Oral  Oral  SpO2: 100% 98% 95% 95%  Weight:      Height:        General: Pt is alert, oriented x 3, awake, not in acute distress Cardiovascular: RRR, S1/S2 +, no rubs, no gallops Respiratory: CTA bilaterally, no wheezing, no rhonchi Abdominal: Soft, NT, ND, bowel sounds + Extremities: no edema, no cyanosis   The results of significant diagnostics from this hospitalization (including imaging, microbiology, ancillary and laboratory) are listed below for reference.     Microbiology: Recent Results (from  the past 240 hour(s))  Culture, Urine     Status: None   Collection Time: 04/25/18 12:23 PM  Result Value Ref Range Status   Specimen Description   Final    URINE, RANDOM Performed at [facility], [address], [place], [place] [address]    Special Requests   Final    NONE Performed at [facility], [address], [place], [place] [address]    Culture   Final    NO GROWTH Performed at [facility], [place] [address], [place],  [address]    Report Status 04/27/2018 FINAL  Final     Labs: BNP (last 3 results) No results for input(s): BNP in the last 8760 hours. Basic Metabolic Panel: Recent Labs  Lab 04/26/18 0606 04/27/18 0612 04/28/18 0459 04/29/18 0454 04/30/18 0730 05/01/18 1657 05/02/18 0509  NA 144 144 145 146* 144 140 140  K 4.8 4.4 3.3* 2.9* 3.2* 3.2* 3.2*  CL 109 109 110 109 109 106 105  CO2 29 27 29 27 29 27 26   GLUCOSE 135* 148* 133* 120* 113* 174* 130*  BUN 22 25* 21 21 17 14 13   CREATININE 0.64 0.63 0.53* 0.61 0.61 0.71 0.61  0.62  CALCIUM 8.8* 8.6* 8.8* 9.1 8.7* 9.2 8.8*  MG 1.8 1.9 2.2  --   --   --  1.9   Liver Function Tests: Recent Labs  Lab 04/28/18 0459 04/29/18 0454 04/30/18 0730 05/01/18 1657 05/02/18 0509  AST 35 28 33 58* 43*  ALT 106* 73* 61* 92* 85*  ALKPHOS 54 53 59 79 71  BILITOT 1.8* 1.8* 1.8* 1.4* 1.2  PROT 6.4* 6.2* 5.5* 6.1* 5.7*  ALBUMIN 3.1* 2.9* 2.5* 2.4* 2.3*   Recent Labs  Lab 04/25/18 1351  04/26/18 0606 04/27/18 0612 04/28/18 0459  LIPASE 1,771* 825* 144* 48   No results for input(s): AMMONIA in the last 168 hours. CBC: Recent Labs  Lab 04/26/18 0606 04/27/18 0612 04/28/18 0459 04/29/18 0454 04/30/18 0730 05/02/18 0509  WBC 16.3* 16.8* 14.9* 12.1* 11.8* 12.8*  NEUTROABS 14.2* 14.4* 12.1* 9.4* 8.8*  --   HGB 14.7 13.5 12.5* 12.2* 11.9* 13.0  HCT 45.3 42.4 39.4 38.6* 36.2* 38.7*  MCV 95.2 96.4 95.9 94.6 92.1 91.5  PLT 152 140* 146* 178 164 228   Cardiac Enzymes: No results for input(s): CKTOTAL, CKMB, CKMBINDEX, TROPONINI in the last 168 hours. BNP: Invalid input(s): POCBNP CBG: Recent Labs  Lab 05/01/18 0754 05/01/18 1207 05/01/18 1645 05/01/18 2038 05/02/18 0717  GLUCAP 186* 150* 158* 162* 123*   D-Dimer No results for input(s): DDIMER in the last 72 hours. Hgb A1c No results for input(s): HGBA1C in the last 72 hours. Lipid Profile No results for input(s): CHOL, HDL, LDLCALC, TRIG, CHOLHDL, LDLDIRECT in the last 72 hours. Thyroid function studies No results for input(s): TSH, T4TOTAL, T3FREE, THYROIDAB in the last 72 hours.  Invalid input(s): FREET3 Anemia work up No results for input(s): VITAMINB12, FOLATE, FERRITIN, TIBC, IRON, RETICCTPCT in the last 72 hours. Urinalysis    Component Value Date/Time   COLORURINE AMBER (A) 04/25/2018 1223   APPEARANCEUR HAZY (A) 04/25/2018 1223   LABSPEC 1.017 04/25/2018 1223   PHURINE 5.0 04/25/2018 1223   GLUCOSEU NEGATIVE 04/25/2018 1223   HGBUR SMALL (A) 04/25/2018 1223   BILIRUBINUR SMALL (A) 04/25/2018 1223   KETONESUR NEGATIVE 04/25/2018 1223   PROTEINUR 30 (A) 04/25/2018 1223   NITRITE NEGATIVE 04/25/2018 1223   LEUKOCYTESUR NEGATIVE 04/25/2018 1223   Sepsis  Labs Invalid input(s): PROCALCITONIN,  WBC,  LACTICIDVEN Microbiology Recent Results (from the past 240 hour(s))  Culture, Urine     Status: None   Collection Time: 04/25/18 12:23 PM  Result Value Ref Range Status   Specimen  Description   Final    URINE, RANDOM Performed at [facility], [address] [place], [place] [address]    Special Requests   Final    NONE Performed at [facility], [address], [place], [place] [address]    Culture   Final    NO GROWTH Performed at [facility], [place] [address], [place], [place] [address]    Report Status 04/27/2018 FINAL  Final   Time coordinating discharge: 34 minutes  SIGNED:  Irwin Brakeman, MD  [facility] 05/02/2018, 11:07 AM Pager [phone]  If 7PM-7AM, please contact night-coverage [email or web address] Password [email or web address]

## 2018-05-02 NOTE — Discharge Instructions (Signed)
Follow with Primary MD  Sharilyn Sites, MD  and other consultant's as instructed your Hospitalist MD  Please get a complete blood count and chemistry panel checked by your Primary MD at your next visit, and again as instructed by your Primary MD.  Get Medicines reviewed and adjusted: Please take all your medications with you for your next visit with your Primary MD  Laboratory/radiological data: Please request your Primary MD to go over all hospital tests and procedure/radiological results at the follow up, please ask your Primary MD to get all Hospital records sent to his/her office.  In some cases, they will be blood work, cultures and biopsy results pending at the time of your discharge. Please request that your primary care M.D. follows up on these results.  Also Note the following: If you experience worsening of your admission symptoms, develop shortness of breath, life threatening emergency, suicidal or homicidal thoughts you must seek medical attention immediately by calling 911 or calling your MD immediately  if symptoms less severe.  You must read complete instructions/literature along with all the possible adverse reactions/side effects for all the Medicines you take and that have been prescribed to you. Take any new Medicines after you have completely understood and accpet all the possible adverse reactions/side effects.   Do not drive when taking Pain medications or sleeping medications (Benzodaizepines)  Do not take more than prescribed Pain, Sleep and Anxiety Medications. It is not advisable to combine anxiety,sleep and pain medications without talking with your primary care practitioner  Special Instructions: If you have smoked or chewed Tobacco  in the last 2 yrs please stop smoking, stop any regular Alcohol  and or any Recreational drug use.  Wear Seat belts while driving.  Please note: You were cared for by a hospitalist during your hospital stay. Once you are discharged,  your primary care physician will handle any further medical issues. Please note that NO REFILLS for any discharge medications will be authorized once you are discharged, as it is imperative that you return to your primary care physician (or establish a relationship with a primary care physician if you do not have one) for your post hospital discharge needs so that they can reassess your need for medications and monitor your lab values.     Acute Pancreatitis Acute pancreatitis is a condition in which the pancreas suddenly gets irritated and swollen (has inflammation). The pancreas is a large gland behind the stomach. It makes enzymes that help to digest food. The pancreas also makes hormones that help to control your blood sugar. Acute pancreatitis happens when the enzymes attack the pancreas and damage it. Most attacks last a couple of days and can cause serious problems. Follow these instructions at home: Eating and drinking  Follow instructions from your doctor about diet. You may need to: ? Avoid alcohol. ? Limit how much fat is in your diet.  Eat small meals often. Avoid eating big meals.  Drink enough fluid to keep your pee (urine) clear or pale yellow.  Do not drink alcohol if it caused your condition. General instructions  Take over-the-counter and prescription medicines only as told by your doctor.  Do not use any tobacco products. These include cigarettes, chewing tobacco, and e-cigarettes. If you need help quitting, ask your doctor.  Get plenty of rest.  If directed, check your blood sugar at home as told by your doctor.  Keep all follow-up visits as told by your doctor. This is important. Contact a doctor if:  You do not get better as quickly as expected.  You have new symptoms.  Your symptoms get worse.  You have lasting pain or weakness.  You continue to feel sick to your stomach (nauseous).  You get better and then you have another pain attack.  You have a  fever. Get help right away if:  You cannot eat or keep fluids down.  Your pain becomes very bad.  Your skin or the white part of your eyes turns yellow (jaundice).  You throw up (vomit).  You feel dizzy or you pass out (faint).  Your blood sugar is high (over 300 mg/dL). This information is not intended to replace advice given to you by your health care provider. Make sure you discuss any questions you have with your health care provider. Document Released: 03/23/2008 Document Revised: 03/12/2016 Document Reviewed: 07/09/2015 Elsevier Interactive Patient Education  2018 [employer].  Finding Treatment for Addiction What is addiction? Addiction is a complex disease of the brain. It causes an uncontrollable (compulsive) need for a substance. You can be addicted to alcohol, illegal drugs, or prescription medicines such as painkillers. Addiction can also be a behavior, like gambling or shopping. The need for the drug or activity can become so strong that you think about it all the time. You can also become physically dependent on a substance. Addiction can change the way your brain works. Because of these changes, getting more of whatever you are addicted to becomes the most important thing to you and feels better than other activities or relationships. Addiction can lead to changes in health, behavior, emotions, relationships, and choices that affect you and everyone around you. How do I know if I need treatment for addiction? Addiction is a progressive disease. Without treatment, addiction can get worse. Living with addiction puts you at higher risk for injury, poor health, lost employment, loss of money, and even death. You might need treatment for addiction if:  You have tried to stop or cut down, but you cannot.  Your addiction is causing physical health problems.  You find it annoying that your friends and family are concerned about your alcohol or substance use.  You feel guilty  about substance abuse or a compulsive behavior.  You have lied or tried to hide your addiction.  You need a particular substance or activity to start your day or to calm down.  You are getting in trouble at school, work, home, or with the police.  You have done something illegal to support your addiction.  You are running out of money because of your addiction.  You have no time for anything other than your addiction.  What types of treatment are available? The treatment program that is right for you will depend on many factors, including the type of addiction you have. Treatment programs can be outpatient or inpatient. In an outpatient program, you live at home and go to work or school, but you also go to a clinic for treatment. With an inpatient program, you live and sleep at the program facility during treatment. After treatment, you might need a plan for support during recovery. Other treatment options include:  Medicine. ? Some addictions may be treated with prescription medicines. ? You might also need medicine to treat anxiety or depression.  Counseling and behavior therapy. Therapy can help individuals and families behave in healthier ways and relate more effectively.  Support groups. Confidential group therapy, such as a 12-step program, can help individuals and families during treatment  and recovery.  No single type of program is right for everyone. Many treatment programs involve a combination of education, counseling, and a 12-step, spiritually-based approach. Some treatment programs are government sponsored. They are geared for patients who do not have private insurance. Treatment programs can vary in many respects, such as:  Cost and types of insurance that are accepted.  Types of on-site medical services that are offered.  Length of stay, setting, and size.  Overall philosophy of treatment.  What should I consider when selecting a treatment program? It is important  to think about your individual requirements when selecting a treatment program. There are a number of things to consider, such as:  If the program is certified by the appropriate government agency. Even private programs must be certified and employ certified professionals.  If the program is covered by your insurance. If finances are a concern, the first call you should make is to your insurance company, if you have health insurance. Ask for a list of treatment programs that are in your network, and confirm any copayments and deductibles that you may have to pay. ? If you do not have insurance, or if you choose to attend a program that does not accept your insurance, discuss whether a payment plan can be set up.  If treatment is available in languages other than English, if needed.  If the program offers detoxification treatment, if needed.  If 12-step meetings are held at the center or if transport is available for patients to attend meetings at other locations.  If the program is professional, organized, and clean.  If the program meets all of your needs, including physical and cultural needs.  If the facility offers specific treatment for your particular addiction.  If support continues to be offered after you have left the program.  If your treatment plan is continually looked at to make sure you are receiving the right treatment at the right time.  If mental health counseling is part of your treatment.  If medicine is included in treatment, if needed.  If your family is included in your treatment plan and if support is offered to them throughout the treatment process.  How the treatment works to prevent relapse.  Where else can I get help?  Your health care provider. Ask him or her to help you find addiction treatment. These discussions are confidential.  The [employer] on Alcoholism and Drug Dependence (NCADD). This group has information about treatment centers and  programs for people who have an addiction and for family members. ? The telephone number is 1-800-NCA-CALL (423-767-0396). ? The website is https://ncadd.org/about-ncadd/our-affiliates  The Substance Abuse and Mental Health Services Administration [facility]). This group will help you find publicly funded treatment centers, help hotlines, and counseling services near you. ? The telephone number is 1-800-662-HELP 607-578-2208). ? The website is www.findtreatment.[employer].com.cy In countries outside of the [place] and [place], look in [employer] for contact information for services in your area. This information is not intended to replace advice given to you by your health care provider. Make sure you discuss any questions you have with your health care provider. Document Released: 09/03/2005 Document Revised: 08/31/2016 Document Reviewed: 07/24/2014 Elsevier Interactive Patient Education  2017 [employer] Inc[employer]   Alcohol Use Disorder Alcohol use disorder is when your drinking disrupts your daily life. When you have this condition, you drink too much alcohol and you cannot control your drinking. Alcohol use disorder can cause serious problems with your physical health. It can  affect your brain, heart, liver, pancreas, immune system, stomach, and intestines. Alcohol use disorder can increase your risk for certain cancers and cause problems with your mental health, such as depression, anxiety, psychosis, delirium, and dementia. People with this disorder risk hurting themselves and others. What are the causes? This condition is caused by drinking too much alcohol over time. It is not caused by drinking too much alcohol only one or two times. Some people with this condition drink alcohol to cope with or escape from negative life events. Others drink to relieve pain or symptoms of mental illness. What increases the risk? You are more likely to develop this condition if:  You have a family history of  alcohol use disorder.  Your culture encourages drinking to the point of intoxication, or makes alcohol easy to get.  You had a mood or conduct disorder in childhood.  You have been a victim of abuse.  You are an adolescent and: ? You have poor grades or difficulties in school. ? Your caregivers do not talk to you about saying no to alcohol, or supervise your activities. ? You are impulsive or you have trouble with self-control.  What are the signs or symptoms? Symptoms of this condition include:  Drinkingmore than you want to.  Drinking for longer than you want to.  Trying several times to drink less or to control your drinking.  Spending a lot of time getting alcohol, drinking, or recovering from drinking.  Craving alcohol.  Having problems at work, at school, or at home due to drinking.  Having problems in relationships due to drinking.  Drinking when it is dangerous to drink, such as before driving a car.  Continuing to drink even though you know you might have a physical or mental problem related to drinking.  Needing more and more alcohol to get the same effect you want from the alcohol (building up tolerance).  Having symptoms of withdrawal when you stop drinking. Symptoms of withdrawal include: ? Fatigue. ? Nightmares. ? Trouble sleeping. ? Depression. ? Anxiety. ? Fever. ? Seizures. ? Severe confusion. ? Feeling or seeing things that are not there (hallucinations). ? Tremors. ? Rapid heart rate. ? Rapid breathing. ? High blood pressure.  Drinking to avoid symptoms of withdrawal.  How is this diagnosed? This condition is diagnosed with an assessment. Your health care provider may start the assessment by asking three or four questions about your drinking. Your health care provider may perform a physical exam or do lab tests to see if you have physical problems resulting from alcohol use. She or he may refer you to a mental health professional for  evaluation. How is this treated? Some people with alcohol use disorder are able to reduce their alcohol use to low-risk levels. Others need to completely quit drinking alcohol. When necessary, mental health professionals with specialized training in substance use treatment can help. Your health care provider can help you decide how severe your alcohol use disorder is and what type of treatment you need. The following forms of treatment are available:  Detoxification. Detoxification involves quitting drinking and using prescription medicines within the first week to help lessen withdrawal symptoms. This treatment is important for people who have had withdrawal symptoms before and for heavy drinkers who are likely to have withdrawal symptoms. Alcohol withdrawal can be dangerous, and in severe cases, it can cause death. Detoxification may be provided in a home, community, or primary care setting, or in a hospital or substance use treatment  facility.  Counseling. This treatment is also called talk therapy. It is provided by substance use treatment counselors. A counselor can address the reasons you use alcohol and suggest ways to keep you from drinking again or to prevent problem drinking. The goals of talk therapy are to: ? Find healthy activities and ways for you to cope with stress. ? Identify and avoid the things that trigger your alcohol use. ? Help you learn how to handle cravings.  Medicines.Medicines can help treat alcohol use disorder by: ? Decreasing alcohol cravings. ? Decreasing the positive feeling you have when you drink alcohol. ? Causing an uncomfortable physical reaction when you drink alcohol (aversion therapy).  Support groups. Support groups are led by people who have quit drinking. They provide emotional support, advice, and guidance.  These forms of treatment are often combined. Some people with this condition benefit from a combination of treatments provided by specialized  substance use treatment centers. Follow these instructions at home:  Take over-the-counter and prescription medicines only as told by your health care provider.  Check with your health care provider before starting any new medicines.  Ask friends and family members not to offer you alcohol.  Avoid situations where alcohol is served, including gatherings where others are drinking alcohol.  Create a plan for what to do when you are tempted to use alcohol.  Find hobbies or activities that you enjoy that do not include alcohol.  Keep all follow-up visits as told by your health care provider. This is important. How is this prevented?  If you drink, limit alcohol intake to no more than 1 drink a day for nonpregnant women and 2 drinks a day for men. One drink equals 12 oz of beer, 5 oz of wine, or 1 oz of hard liquor.  If you have a mental health condition, get treatment and support.  Do not give alcohol to adolescents.  If you are an adolescent: ? Do not drink alcohol. ? Do not be afraid to say no if someone offers you alcohol. Speak up about why you do not want to drink. You can be a positive role model for your friends and set a good example for those around you by not drinking alcohol. ? If your friends drink, spend time with others who do not drink alcohol. Make new friends who do not use alcohol. ? Find healthy ways to manage stress and emotions, such as meditation or deep breathing, exercise, spending time in nature, listening to music, or talking with a trusted friend or family member. Contact a health care provider if:  You are not able to take your medicines as told.  Your symptoms get worse.  You return to drinking alcohol (relapse) and your symptoms get worse. Get help right away if:  You have thoughts about hurting yourself or others. If you ever feel like you may hurt yourself or others, or have thoughts about taking your own life, get help right away. You can go to  your nearest emergency department or call:  Your local emergency services (911 in the [place]  A suicide crisis helpline, such as the [facility] at 913-428-5486. This is open 24 hours a day.  Summary  Alcohol use disorder is when your drinking disrupts your daily life. When you have this condition, you drink too much alcohol and you cannot control your drinking.  Treatment may include detoxification, counseling, medicine, and support groups.  Ask friends and family members not to offer you alcohol.  Avoid situations where alcohol is served.  Get help right away if you have thoughts about hurting yourself or others. This information is not intended to replace advice given to you by your health care provider. Make sure you discuss any questions you have with your health care provider. Document Released: 11/12/2004 Document Revised: 07/02/2016 Document Reviewed: 07/02/2016 Elsevier Interactive Patient Education  2018 [employer].    Alcohol Abuse and Nutrition Alcohol abuse is any pattern of alcohol consumption that harms your health, relationships, or work. Alcohol abuse can affect how your body breaks down and absorbs nutrients from food by causing your liver to work abnormally. Additionally, many people who abuse alcohol do not eat enough carbohydrates, protein, fat, vitamins, and minerals. This can cause poor nutrition (malnutrition) and a lack of nutrients (nutrient deficiencies), which can lead to further complications. Nutrients that are commonly lacking (deficient) among people who abuse alcohol include:  Vitamins. ? Vitamin A. This is stored in your liver. It is important for your vision, metabolism, and ability to fight off infections (immunity). ? B vitamins. These include vitamins such as folate, thiamin, and niacin. These are important in new cell growth and maintenance. ? Vitamin C. This plays an important role in iron absorption, wound healing, and  immunity. ? Vitamin D. This is produced by your liver, but you can also get vitamin D from food. Vitamin D is necessary for your body to absorb and use calcium.  Minerals. ? Calcium. This is important for your bones and your heart and blood vessel (cardiovascular) function. ? Iron. This is important for blood, muscle, and nervous system functioning. ? Magnesium. This plays an important role in muscle and nerve function, and it helps to control blood sugar and blood pressure. ? Zinc. This is important for the normal function of your nervous system and digestive system (gastrointestinal tract).  Nutrition is an essential component of therapy for alcohol abuse. Your health care provider or dietitian will work with you to design a plan that can help restore nutrients to your body and prevent potential complications. What is my plan? Your dietitian may develop a specific diet plan that is based on your condition and any other complications you may have. A diet plan will commonly include:  A balanced diet. ? Grains: 6-8 oz per day. ? Vegetables: 2-3 cups per day. ? Fruits: 1-2 cups per day. ? Meat and other protein: 5-6 oz per day. ? Dairy: 2-3 cups per day.  Vitamin and mineral supplements.  What do I need to know about alcohol and nutrition?  Consume foods that are high in antioxidants, such as grapes, berries, nuts, green tea, and dark green and orange vegetables. This can help to counteract some of the stress that is placed on your liver by consuming alcohol.  Avoid food and drinks that are high in fat and sugar. Foods such as sugared soft drinks, salty snack foods, and candy contain empty calories. This means that they lack important nutrients such as protein, fiber, and vitamins.  Eat frequent meals and snacks. Try to eat 5-6 small meals each day.  Eat a variety of fresh fruits and vegetables each day. This will help you get plenty of water, fiber, and vitamins in your diet.  Drink  plenty of water and other clear fluids. Try to drink at least 48-64 oz (1.5-2 L) of water per day.  If you are a vegetarian, eat a variety of protein-rich foods. Pair whole grains with plant-based proteins at meals and  snacks to obtain the greatest nutrient benefit from your food. For example, eat rice with beans, put peanut butter on whole-grain toast, or eat oatmeal with sunflower seeds.  Soak beans and whole grains overnight before cooking. This can help your body to absorb the nutrients more easily.  Include foods fortified with vitamins and minerals in your diet. Commonly fortified foods include milk, orange juice, cereal, and bread.  If you are malnourished, your dietitian may recommend a high-protein, high-calorie diet. This may include: ? 2,000-3,000 calories (kilocalories) per day. ? 70-100 grams of protein per day.  Your health care provider may recommend a complete nutritional supplement beverage. This can help to restore calories, protein, and vitamins to your body. Depending on your condition, you may be advised to consume this instead of or in addition to meals.  Limit your intake of caffeine. Replace drinks like coffee and black tea with decaffeinated coffee and herbal tea.  Eat a variety of foods that are high in omega fatty acids. These include fish, nuts and seeds, and soybeans. These foods may help your liver to recover and may also stabilize your mood.  Certain medicines may cause changes in your appetite, taste, and weight. Work with your health care provider and dietitian to make any adjustments to your medicines and diet plan.  Include other healthy lifestyle choices in your daily routine. ? Be physically active. ? Get enough sleep. ? Spend time doing activities that you enjoy.  If you are unable to take in enough food and calories by mouth, your health care provider may recommend a feeding tube. This is a tube that passes through your nose and throat, directly into  your stomach. Nutritional supplement beverages can be given to you through the feeding tube to help you get the nutrients you need.  Take vitamin or mineral supplements as recommended by your health care provider. What foods can I eat? Grains Enriched pasta. Enriched rice. Fortified whole-grain bread. Fortified whole-grain cereal. Barley. Brown rice. Quinoa. [place]. Vegetables All fresh, frozen, and canned vegetables. Spinach. Kale. Artichoke. Carrots. Winter squash and pumpkin. Sweet potatoes. Broccoli. Cabbage. Cucumbers. Tomatoes. Sweet peppers. Green beans. Peas. Corn. Fruits All fresh and frozen fruits. Berries. Grapes. Mango. Papaya. Guava. Cherries. Apples. Bananas. Peaches. Plums. Pineapple. Watermelon. Cantaloupe. Oranges. Avocado. Meats and Other Protein Sources Beef liver. Lean beef. Pork. Fresh and canned chicken. Fresh fish. Oysters. Sardines. Canned tuna. Shrimp. Eggs with yolks. Nuts and seeds. Peanut butter. Beans and lentils. Soybeans. Tofu. Dairy Whole, low-fat, and nonfat milk. Whole, low-fat, and nonfat yogurt. Cottage cheese. Sour cream. Hard and soft cheeses. Beverages Water. Herbal tea. Decaffeinated coffee. Decaffeinated green tea. 100% fruit juice. 100% vegetable juice. Instant breakfast shakes. Condiments Ketchup. Mayonnaise. Mustard. Salad dressing. Barbecue sauce. Sweets and Desserts Sugar-free ice cream. Sugar-free pudding. Sugar-free gelatin. Fats and Oils Butter. Vegetable oil, flaxseed oil, olive oil, and walnut oil. Other Complete nutrition shakes. Protein bars. Sugar-free gum. The items listed above may not be a complete list of recommended foods or beverages. Contact your dietitian for more options. What foods are not recommended? Grains Sugar-sweetened breakfast cereals. Flavored instant oatmeal. Fried breads. Vegetables Breaded or deep-fried vegetables. Fruits Dried fruit with added sugar. Candied fruit. Canned fruit in syrup. Meats and Other  Protein Sources Breaded or deep-fried meats. Dairy Flavored milks. Fried cheese curds or fried cheese sticks. Beverages Alcohol. Sugar-sweetened soft drinks. Sugar-sweetened tea. Caffeinated coffee and tea. Condiments Sugar. Honey. Agave nectar. Molasses. Sweets and Desserts Chocolate. Cake. Cookies. Candy. Other Potato chips.  Pretzels. Salted nuts. Candied nuts. The items listed above may not be a complete list of foods and beverages to avoid. Contact your dietitian for more information. This information is not intended to replace advice given to you by your health care provider. Make sure you discuss any questions you have with your health care provider. Document Released: 07/30/2005 Document Revised: 02/12/2016 Document Reviewed: 05/08/2014 Elsevier Interactive Patient Education  [employer].

## 2018-05-04 DIAGNOSIS — K859 Acute pancreatitis without necrosis or infection, unspecified: Secondary | ICD-10-CM | POA: Diagnosis not present

## 2018-05-04 DIAGNOSIS — K852 Alcohol induced acute pancreatitis without necrosis or infection: Secondary | ICD-10-CM | POA: Diagnosis not present

## 2018-05-04 DIAGNOSIS — Z6826 Body mass index (BMI) 26.0-26.9, adult: Secondary | ICD-10-CM | POA: Diagnosis not present

## 2018-05-04 DIAGNOSIS — E663 Overweight: Secondary | ICD-10-CM | POA: Diagnosis not present

## 2018-05-09 DIAGNOSIS — S36209D Unspecified injury of unspecified part of pancreas, subsequent encounter: Secondary | ICD-10-CM | POA: Diagnosis not present

## 2018-05-23 ENCOUNTER — Ambulatory Visit (INDEPENDENT_AMBULATORY_CARE_PROVIDER_SITE_OTHER): Payer: Medicare HMO | Admitting: Internal Medicine

## 2018-05-24 ENCOUNTER — Ambulatory Visit (INDEPENDENT_AMBULATORY_CARE_PROVIDER_SITE_OTHER): Payer: Medicare HMO | Admitting: Internal Medicine

## 2018-05-24 ENCOUNTER — Encounter (INDEPENDENT_AMBULATORY_CARE_PROVIDER_SITE_OTHER): Payer: Self-pay | Admitting: Internal Medicine

## 2018-07-29 DIAGNOSIS — Z23 Encounter for immunization: Secondary | ICD-10-CM | POA: Diagnosis not present

## 2018-10-14 DIAGNOSIS — R7309 Other abnormal glucose: Secondary | ICD-10-CM | POA: Diagnosis not present

## 2018-10-14 DIAGNOSIS — E782 Mixed hyperlipidemia: Secondary | ICD-10-CM | POA: Diagnosis not present

## 2018-10-14 DIAGNOSIS — Z23 Encounter for immunization: Secondary | ICD-10-CM | POA: Diagnosis not present

## 2018-10-14 DIAGNOSIS — Z1389 Encounter for screening for other disorder: Secondary | ICD-10-CM | POA: Diagnosis not present

## 2018-10-14 DIAGNOSIS — Z6824 Body mass index (BMI) 24.0-24.9, adult: Secondary | ICD-10-CM | POA: Diagnosis not present

## 2018-10-14 DIAGNOSIS — R972 Elevated prostate specific antigen [PSA]: Secondary | ICD-10-CM | POA: Diagnosis not present

## 2018-10-14 DIAGNOSIS — I10 Essential (primary) hypertension: Secondary | ICD-10-CM | POA: Diagnosis not present

## 2018-12-30 ENCOUNTER — Ambulatory Visit (INDEPENDENT_AMBULATORY_CARE_PROVIDER_SITE_OTHER): Payer: Medicare Other | Admitting: Urology

## 2018-12-30 DIAGNOSIS — R350 Frequency of micturition: Secondary | ICD-10-CM

## 2018-12-30 DIAGNOSIS — R972 Elevated prostate specific antigen [PSA]: Secondary | ICD-10-CM

## 2018-12-30 DIAGNOSIS — N401 Enlarged prostate with lower urinary tract symptoms: Secondary | ICD-10-CM | POA: Diagnosis not present

## 2019-01-25 ENCOUNTER — Other Ambulatory Visit: Payer: Self-pay | Admitting: Urology

## 2019-01-25 DIAGNOSIS — R972 Elevated prostate specific antigen [PSA]: Secondary | ICD-10-CM

## 2019-02-14 DIAGNOSIS — R972 Elevated prostate specific antigen [PSA]: Secondary | ICD-10-CM | POA: Diagnosis not present

## 2019-02-20 ENCOUNTER — Ambulatory Visit
Admission: RE | Admit: 2019-02-20 | Discharge: 2019-02-20 | Disposition: A | Discharge status: Home / Self Care | Payer: Medicare Other | Source: Ambulatory Visit | Attending: Urology | Admitting: Urology

## 2019-02-20 ENCOUNTER — Other Ambulatory Visit: Payer: Self-pay

## 2019-02-20 DIAGNOSIS — R972 Elevated prostate specific antigen [PSA]: Secondary | ICD-10-CM | POA: Diagnosis not present

## 2019-02-20 MED ORDER — GADOBENATE DIMEGLUMINE 529 MG/ML IV SOLN
16.0000 mL | Freq: Once | INTRAVENOUS | Status: AC | PRN
  Administered 2019-02-20: 11:00:00 16 mL via INTRAVENOUS

## 2019-03-20 DIAGNOSIS — Z1389 Encounter for screening for other disorder: Secondary | ICD-10-CM | POA: Diagnosis not present

## 2019-03-20 DIAGNOSIS — Z125 Encounter for screening for malignant neoplasm of prostate: Secondary | ICD-10-CM | POA: Diagnosis not present

## 2019-03-20 DIAGNOSIS — Z0001 Encounter for general adult medical examination with abnormal findings: Secondary | ICD-10-CM | POA: Diagnosis not present

## 2019-03-20 DIAGNOSIS — I10 Essential (primary) hypertension: Secondary | ICD-10-CM | POA: Diagnosis not present

## 2019-03-20 DIAGNOSIS — R972 Elevated prostate specific antigen [PSA]: Secondary | ICD-10-CM | POA: Diagnosis not present

## 2019-03-20 DIAGNOSIS — Z6824 Body mass index (BMI) 24.0-24.9, adult: Secondary | ICD-10-CM | POA: Diagnosis not present

## 2019-03-20 DIAGNOSIS — R7309 Other abnormal glucose: Secondary | ICD-10-CM | POA: Diagnosis not present

## 2019-03-20 DIAGNOSIS — E782 Mixed hyperlipidemia: Secondary | ICD-10-CM | POA: Diagnosis not present

## 2019-07-07 ENCOUNTER — Ambulatory Visit: Payer: Medicare Other | Admitting: Urology

## 2019-07-20 DIAGNOSIS — Z23 Encounter for immunization: Secondary | ICD-10-CM | POA: Diagnosis not present

## 2019-08-11 DIAGNOSIS — R972 Elevated prostate specific antigen [PSA]: Secondary | ICD-10-CM | POA: Diagnosis not present

## 2019-08-18 ENCOUNTER — Other Ambulatory Visit: Payer: Self-pay

## 2019-08-18 ENCOUNTER — Ambulatory Visit (INDEPENDENT_AMBULATORY_CARE_PROVIDER_SITE_OTHER): Payer: Medicare Other | Admitting: Urology

## 2019-08-18 DIAGNOSIS — N401 Enlarged prostate with lower urinary tract symptoms: Secondary | ICD-10-CM | POA: Diagnosis not present

## 2019-08-18 DIAGNOSIS — R350 Frequency of micturition: Secondary | ICD-10-CM | POA: Diagnosis not present

## 2019-08-18 DIAGNOSIS — R972 Elevated prostate specific antigen [PSA]: Secondary | ICD-10-CM | POA: Diagnosis not present

## 2019-09-29 DIAGNOSIS — L570 Actinic keratosis: Secondary | ICD-10-CM | POA: Diagnosis not present

## 2019-09-29 DIAGNOSIS — I10 Essential (primary) hypertension: Secondary | ICD-10-CM | POA: Diagnosis not present

## 2019-09-29 DIAGNOSIS — M5412 Radiculopathy, cervical region: Secondary | ICD-10-CM | POA: Diagnosis not present

## 2019-09-29 DIAGNOSIS — M5020 Other cervical disc displacement, unspecified cervical region: Secondary | ICD-10-CM | POA: Diagnosis not present

## 2019-09-29 DIAGNOSIS — E663 Overweight: Secondary | ICD-10-CM | POA: Diagnosis not present

## 2019-09-29 DIAGNOSIS — Z024 Encounter for examination for driving license: Secondary | ICD-10-CM | POA: Diagnosis not present

## 2019-09-29 DIAGNOSIS — Z6825 Body mass index (BMI) 25.0-25.9, adult: Secondary | ICD-10-CM | POA: Diagnosis not present

## 2019-09-29 DIAGNOSIS — R7309 Other abnormal glucose: Secondary | ICD-10-CM | POA: Diagnosis not present

## 2019-10-27 ENCOUNTER — Other Ambulatory Visit: Payer: Self-pay | Admitting: Neurosurgery

## 2019-10-27 ENCOUNTER — Other Ambulatory Visit (HOSPITAL_COMMUNITY): Payer: Self-pay | Admitting: Neurosurgery

## 2019-10-27 DIAGNOSIS — I10 Essential (primary) hypertension: Secondary | ICD-10-CM | POA: Diagnosis not present

## 2019-10-27 DIAGNOSIS — M542 Cervicalgia: Secondary | ICD-10-CM

## 2019-10-27 DIAGNOSIS — Z6825 Body mass index (BMI) 25.0-25.9, adult: Secondary | ICD-10-CM | POA: Diagnosis not present

## 2019-11-01 ENCOUNTER — Telehealth (HOSPITAL_COMMUNITY): Payer: Self-pay | Admitting: Physical Therapy

## 2019-11-01 NOTE — Telephone Encounter (Signed)
Pt was scheduled for 11/03/2019 provider called to cx due to CR being out of office L/m. Requested pt call back to r/s

## 2019-11-03 ENCOUNTER — Other Ambulatory Visit: Payer: Self-pay

## 2019-11-03 ENCOUNTER — Encounter (HOSPITAL_COMMUNITY): Payer: Self-pay | Admitting: Physical Therapy

## 2019-11-03 ENCOUNTER — Ambulatory Visit (HOSPITAL_COMMUNITY): Payer: Medicare Other | Admitting: Physical Therapy

## 2019-11-03 ENCOUNTER — Encounter (HOSPITAL_COMMUNITY): Payer: Self-pay

## 2019-11-03 ENCOUNTER — Ambulatory Visit (HOSPITAL_COMMUNITY): Payer: Medicare Other | Attending: Neurosurgery | Admitting: Physical Therapy

## 2019-11-03 DIAGNOSIS — M542 Cervicalgia: Secondary | ICD-10-CM | POA: Insufficient documentation

## 2019-11-03 NOTE — Therapy (Signed)
[place], [place], [address] Phone: [phone]   Fax:  [phone]  Physical Therapy Evaluation  Patient Details  Name: Matthew Payne MRN: [id number] Date of Birth: April 25, 1946 Referring Provider (PT): Duffy Rhody   Encounter Date: 11/03/2019  PT End of Session - 11/03/19 0823    Visit Number  1    Number of Visits  12    Date for PT Re-Evaluation  12/15/19    Authorization Type  MCR    Authorization Time Period  POC Dates 11/03/19 to 12/15/2019    Authorization - Visit Number  1    Authorization - Number of Visits  10    PT Start Time  0825    PT Stop Time  0910    PT Time Calculation (min)  45 min    Activity Tolerance  Patient tolerated treatment well;No increased pain    Behavior During Therapy  WFL for tasks assessed/performed       Past Medical History:  Diagnosis Date  . Hypertension     Past Surgical History:  Procedure Laterality Date  . CATARACT EXTRACTION, BILATERAL  5 yrs ago  . COLONOSCOPY  05/28/2011   Procedure: COLONOSCOPY;  Surgeon: [name], MD;  [facility];  Service: Endoscopy;  Laterality: N/A;  . CYSTECTOMY     back & leg  . INGUINAL HERNIA REPAIR  07/01/2012   Procedure: HERNIA REPAIR INGUINAL ADULT;  Surgeon: [name], MD;  Location: [facility];  Service: General;  Laterality: Right;  Right Inguinal Herniorraphy with Mesh  . Nesbit  Plication    . TONSILLECTOMY      There were no vitals filed for this visit.   Subjective Assessment - 11/03/19 0831    Subjective  Patient complains of chronic neck pain and stiffness. 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 States he has returned to work part-full time and has noticed increased pain and limitations in ability to turn his head and back his truck up. States that he also occasional gets pain at night at the base of his head when sleeping that wakes him up.  States he either sleeps on his side or back. Reports not numbness or symptoms in arms. States he usually just puts up with the pain but wants to be able to safely look behind him. States he also has difficulty looking up .    Pertinent History  HTN, chronicity of condition    How long can you sit comfortably?  no problems    How long can you stand comfortably?  no problems    How long can you walk comfortably?  no problems    Diagnostic tests  MRI    Patient Stated Goals  to be able to turn to back in trailer    Currently in Pain?  Yes    Pain Score  5     Pain Location  Neck    Pain Orientation  Right;Left;Posterior    Pain Type  Chronic pain    Pain Radiating Towards  down neck    Pain Onset  More than a month ago    Pain Frequency  Intermittent    Aggravating Factors   moving neck    Pain Relieving Factors  not moving neck         [facility] PT Assessment - 11/03/19 0001      Assessment   Medical  Diagnosis  cervicalgia    Referring Provider (PT)  Duffy Rhody      Balance Screen   Has the patient fallen in the past 6 months  No      Prior Function   Level of Independence  Independent      Cognition   Overall Cognitive Status  Within Functional Limits for tasks assessed      Observation/Other Assessments   Focus on Therapeutic Outcomes (FOTO)   47% limited      ROM / Strength   AROM / PROM / Strength  AROM;Strength      AROM   Overall AROM Comments  shoulder ROM WNL     AROM Assessment Site  Cervical;Shoulder    Right/Left Shoulder  Left;Right    Cervical Flexion  55   pain in back of neck   Cervical Extension  10   pain in back of neck   Cervical - Right Side Bend  10   pain in back of neck   Cervical - Left Side Bend  12   pain in back of neck   Cervical - Right Rotation  27   pain in back of neck--> to 45 by End of session   Cervical - Left Rotation  30   pain in back of neck --> to 40 by end of session     Strength   Strength Assessment Site  Shoulder     Right/Left Shoulder  Right;Left    Right Shoulder Flexion  5/5    Right Shoulder ABduction  5/5    Left Shoulder Flexion  5/5    Left Shoulder ABduction  5/5      Palpation   Spinal mobility  Hypomobility noted throughout cervical spine with medial glides and PAs    Palpation comment  tenderness to palpation along bilateral: SCM, cervical paraspinals, UT and anterior scalenes      Special Tests   Other special tests  no change in symptoms with compression/decompression                Objective measurements completed on examination: See above findings.      [facility] Adult PT Treatment/Exercise - 11/03/19 0001      Manual Therapy   Manual Therapy  Joint mobilization;Soft tissue mobilization    Manual therapy comments  all manual interventions performed independently of other interventions    Joint Mobilization  medial glides C2-6 B grade III    Soft tissue mobilization  STM to R UT, B SCM and B cervical paraspinals             PT Education - 11/03/19 0941    Education Details  educated in cervical anatomy, how joints can self-lubricate with mobility, how therapy can assist with symptoms and functional limitations. 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    Time  3    Status  New    Target Date  11/24/19      PT SHORT TERM GOAL #3   Title  Patient will report at least 25% improvement in overall symptoms to improve QOL.    Time  3    Period  Weeks    Status  New     Target Date  11/24/19        PT Long Term Goals - 11/03/19 0939      PT LONG TERM GOAL #1   Title  Patient will report at least 75% improvement in overall symptoms to improve QOL.    Time  6    Period  Weeks    Status  New    Target Date  12/15/19      PT LONG TERM GOAL #2   Title  Patient will score with les then 40% limitation on FOTO to demonstrate improved functional ability.    Time  6    Period  Weeks    Status  New    Target Date  12/15/19      PT LONG TERM GOAL #3   Title  Patient will demonstrate at least 20 degrees of cervical extension to improve ability to look up.    Time  6    Period  Weeks    Status  New    Target Date  12/15/19             Plan - 11/03/19 0934    Clinical Impression Statement  Patient presents with hypomobility throughout cervical spine. Patient responded well to today's session with improvement in cervical rotation after joint and soft tissue mobilizations of the cervical spine. Educated patient on plan of care and how physical therapy can help with him reaching his functional goals. Patient would benefit from skilled physical therapy at this time to improve functional mobility and return patient to optimal quality of life.    Personal Factors and Comorbidities  Comorbidity 1;Age    Comorbidities  HTN    Examination-Activity Limitations  Sleep;Other   driving and looking up   Examination-Participation Restrictions  Driving;Yard Work;Cleaning    Stability/Clinical Decision Making  Stable/Uncomplicated    Clinical Decision Making  Low    Rehab Potential  Good    PT Frequency  2x / week    PT Duration  6 weeks    PT Treatment/Interventions  ADLs/Self Care Home Management;Biofeedback;Cryotherapy;Electrical Stimulation;Moist Heat;Traction;Therapeutic exercise;Therapeutic activities;Functional mobility training;Stair training;Gait training;Neuromuscular re-education;Patient/family education;Manual techniques;Dry needling;Passive range of  motion;Joint Manipulations    PT Next Visit Plan  continue with cervical joint mobilizations, STM to cervical musculature - add cerivcal ROM and shoulder extension as tolerated    PT Home Exercise Plan  self mobilization to SCM and UT    Consulted and Agree with Plan of Care  Patient       Patient will benefit from skilled therapeutic intervention in order to improve the following deficits and impairments:  Pain, Decreased mobility, Hypomobility  Visit Diagnosis: Cervicalgia - Plan: PT plan of care cert/re-cert     Problem List Patient Active Problem List   Diagnosis Date Noted  . Acute pancreatitis 04/25/2018  . Hypertension 04/25/2018  . Elevated lipase 04/25/2018  . Dehydration 04/25/2018  . Alcohol dependence, episodic drinking behavior ([place]) 04/25/2018  . Elevated d-dimer 04/25/2018  . Epigastric abdominal pain 04/25/2018  . Proteinuria 04/25/2018  . Leukocytosis 04/25/2018  . UTI (urinary tract infection)  04/25/2018  . Hyperlipidemia 04/25/2018  . Hyperglycemia 04/25/2018  . Abnormal transaminases 04/25/2018   9:45 AM, 11/03/19 Jerene Pitch, DPT Physical Therapy with [facility]  [phone] office  [place] [address] [place], [place], [address] Phone: [phone]   Fax:  [phone]  Name: Matthew Payne MRN: [id number] Date of Birth: January 16, 1946

## 2019-11-06 ENCOUNTER — Encounter (HOSPITAL_COMMUNITY): Payer: Self-pay | Admitting: Physical Therapy

## 2019-11-06 ENCOUNTER — Other Ambulatory Visit: Payer: Self-pay

## 2019-11-06 ENCOUNTER — Encounter: Payer: Self-pay | Admitting: Urology

## 2019-11-06 ENCOUNTER — Ambulatory Visit (HOSPITAL_COMMUNITY): Payer: Medicare Other | Admitting: Physical Therapy

## 2019-11-06 DIAGNOSIS — M542 Cervicalgia: Secondary | ICD-10-CM | POA: Diagnosis not present

## 2019-11-06 NOTE — Therapy (Signed)
[place], [place], [address] Phone: [phone]   Fax:  [phone]  Physical Therapy Treatment  Patient Details  Name: Matthew Payne MRN: [id number] Date of Birth: 02-19-1946 Referring Provider (PT): Duffy Rhody   Encounter Date: 11/06/2019  PT End of Session - 11/06/19 1117    Visit Number  2    Number of Visits  12    Date for PT Re-Evaluation  12/15/19    Authorization Type  MCR    Authorization Time Period  POC Dates 11/03/19 to 12/15/2019    Authorization - Visit Number  2    Authorization - Number of Visits  10    PT Start Time  1125    PT Stop Time  [id number]    PT Time Calculation (min)  40 min    Activity Tolerance  Patient tolerated treatment well;No increased pain    Behavior During Therapy  WFL for tasks assessed/performed       Past Medical History:  Diagnosis Date  . Hypertension     Past Surgical History:  Procedure Laterality Date  . CATARACT EXTRACTION, BILATERAL  5 yrs ago  . COLONOSCOPY  05/28/2011   Procedure: COLONOSCOPY;  Surgeon: [name], MD;  [facility];  Service: Endoscopy;  Laterality: N/A;  . CYSTECTOMY     back & leg  . INGUINAL HERNIA REPAIR  07/01/2012   Procedure: HERNIA REPAIR INGUINAL ADULT;  Surgeon: [name], MD;  Location: [facility];  Service: General;  Laterality: Right;  Right Inguinal Herniorraphy with Mesh  . Nesbit  Plication    . TONSILLECTOMY      There were no vitals filed for this visit.  Subjective Assessment - 11/06/19 1117    Subjective  States he feels alright, just sore where he is rubbing it. States that he doesn't feel like it is as painful as it was.    Pertinent History  HTN, chronicity of condition    How long can you sit comfortably?  no problems    How long can you stand comfortably?  no problems    How long can you walk comfortably?  no problems    Diagnostic tests  MRI    Patient Stated Goals  to be able to turn to  back in trailer    Pain Onset  More than a month ago         [facility] PT Assessment - 11/06/19 0001      Assessment   Medical Diagnosis  cervicalgia    Referring Provider (PT)  Duffy Rhody                   [facility] Adult PT Treatment/Exercise - 11/06/19 0001      Exercises   Exercises  Neck;Shoulder      Neck Exercises: Standing   Other Standing Exercises  self mobilization with lacrosse ball to posterior musculature at wall with lacrosse ball      Shoulder Exercises: Supine   Other Supine Exercises  scapular retraction x5, 5" holds      Shoulder Exercises: Seated   Other Seated Exercises  scapular retraction x15, 5" holds      Shoulder Exercises: Standing   Other Standing Exercises  pec stretch - doorway- bilateral painful --> unilateral x5, 10" holds B      Manual Therapy   Manual Therapy  Joint mobilization;Soft tissue mobilization    Manual therapy comments  all  manual interventions performed independently of other interventions    Joint Mobilization  medial glides and UPA to C2-7 with and without cervical ROM - grade II/III     Soft tissue mobilization  STM to B: UT, cervical paraspinals, SCM, anterior scalenes               PT Short Term Goals - 11/03/19 0939      PT SHORT TERM GOAL #1   Title  Patient will be independent in HEP  to improve functional outcomes    Time  3    Period  Weeks    Status  New    Target Date  11/24/19      PT SHORT TERM GOAL #2   Title  Patient will consistently demonstrate at least 45 degrees of cervical 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    Time  3    Status  New    Target Date  11/24/19      PT SHORT TERM GOAL #3   Title  Patient will report at least 25% improvement in overall symptoms to improve QOL.    Time  3    Period  Weeks    Status  New    Target Date  11/24/19        PT Long Term Goals - 11/03/19 0939      PT LONG TERM GOAL #1   Title  Patient will report  at least 75% improvement in overall symptoms to improve QOL.    Time  6    Period  Weeks    Status  New    Target Date  12/15/19      PT LONG TERM GOAL #2   Title  Patient will score with les then 40% limitation on FOTO to demonstrate improved functional ability.    Time  6    Period  Weeks    Status  New    Target Date  12/15/19      PT LONG TERM GOAL #3   Title  Patient will demonstrate at least 20 degrees of cervical extension to improve ability to look up.    Time  6    Period  Weeks    Status  New    Target Date  12/15/19            Plan - 11/06/19 1304    Clinical Impression Statement  Focused on joint and soft tissue mobility today. Tolerated this moderately well. Demonstrated improved cervical ROM afterwards. Very difficult for patient to perform scapular retraction correctly (tends to elevate shoulders instead). Educated patietn in self mobilization with lacrosse ball and this was tolerated moderately well. Will continue to work on mobilization as tolerated by patient.    Personal Factors and Comorbidities  Comorbidity 1;Age    Comorbidities  HTN    Examination-Activity Limitations  Sleep;Other   driving and looking up   Examination-Participation Restrictions  Driving;Yard Work;Cleaning    Stability/Clinical Decision Making  Stable/Uncomplicated    Rehab Potential  Good    PT Frequency  2x / week    PT Duration  6 weeks    PT Treatment/Interventions  ADLs/Self Care Home Management;Biofeedback;Cryotherapy;Electrical Stimulation;Moist Heat;Traction;Therapeutic exercise;Therapeutic activities;Functional mobility training;Stair training;Gait training;Neuromuscular re-education;Patient/family education;Manual techniques;Dry needling;Passive range of motion;Joint Manipulations    PT Next Visit Plan  continue with cervical joint mobilizations, STM to cervical musculature - add cerivcal ROM and shoulder extension as tolerated    PT  Home Exercise Plan  self mobilization to SCM  and UT    Consulted and Agree with Plan of Care  Patient       Patient will benefit from skilled therapeutic intervention in order to improve the following deficits and impairments:  Pain, Decreased mobility, Hypomobility  Visit Diagnosis: Cervicalgia     Problem List Patient Active Problem List   Diagnosis Date Noted  . Acute pancreatitis 04/25/2018  . Hypertension 04/25/2018  . Elevated lipase 04/25/2018  . Dehydration 04/25/2018  . Alcohol dependence, episodic drinking behavior ([place]) 04/25/2018  . Elevated d-dimer 04/25/2018  . Epigastric abdominal pain 04/25/2018  . Proteinuria 04/25/2018  . Leukocytosis 04/25/2018  . UTI (urinary tract infection) 04/25/2018  . Hyperlipidemia 04/25/2018  . Hyperglycemia 04/25/2018  . Abnormal transaminases 04/25/2018   1:07 PM, 11/06/19 Jerene Pitch, DPT Physical Therapy with [facility]  [phone] office  [place] [address] [place], [place], [address] Phone: [phone]   Fax:  [phone]  Name: KIP CALLWOOD MRN: [id number] Date of Birth: 22-Dec-1945

## 2019-11-10 ENCOUNTER — Other Ambulatory Visit: Payer: Self-pay

## 2019-11-10 ENCOUNTER — Ambulatory Visit (HOSPITAL_COMMUNITY): Payer: Medicare Other | Admitting: Physical Therapy

## 2019-11-10 ENCOUNTER — Encounter (HOSPITAL_COMMUNITY): Payer: Self-pay | Admitting: Physical Therapy

## 2019-11-10 DIAGNOSIS — M542 Cervicalgia: Secondary | ICD-10-CM | POA: Diagnosis not present

## 2019-11-10 NOTE — Therapy (Signed)
[place], [place], [address] Phone: [phone]   Fax:  [phone]  Physical Therapy Treatment  Patient Details  Name: Matthew Payne MRN: [id number] Date of Birth: [birth year] Referring Provider (PT): Duffy Rhody   Encounter Date: 11/10/2019  PT End of Session - 11/10/19 [id number]    Visit Number  3    Number of Visits  12    Date for PT Re-Evaluation  12/15/19    Authorization Type  MCR    Authorization Time Period  POC Dates 11/03/19 to 12/15/2019    Authorization - Visit Number  3    Authorization - Number of Visits  10    PT Start Time  [id number]    PT Stop Time  1125    PT Time Calculation (min)  40 min    Activity Tolerance  Patient tolerated treatment well;No increased pain    Behavior During Therapy  WFL for tasks assessed/performed       Past Medical History:  Diagnosis Date  . Hypertension     Past Surgical History:  Procedure Laterality Date  . CATARACT EXTRACTION, BILATERAL  5 yrs ago  . COLONOSCOPY  05/28/2011   Procedure: COLONOSCOPY;  Surgeon: [name], MD;  [facility];  Service: Endoscopy;  Laterality: N/A;  . CYSTECTOMY     back & leg  . INGUINAL HERNIA REPAIR  07/01/2012   Procedure: HERNIA REPAIR INGUINAL ADULT;  Surgeon: [name], MD;  Location: [facility];  Service: General;  Laterality: Right;  Right Inguinal Herniorraphy with Mesh  . Nesbit  Plication    . TONSILLECTOMY      There were no vitals filed for this visit.  Subjective Assessment - 11/10/19 1049    Subjective  States he has about 4/10 pain and he feels like overall he feels that he still hurts when he turns his head and still has limitations with rotation. Been working 10-12 hours days.    Pertinent History  HTN, chronicity of condition    How long can you sit comfortably?  no problems    How long can you stand comfortably?  no problems    How long can you walk comfortably?  no problems    Diagnostic  tests  MRI    Patient Stated Goals  to be able to turn to back in trailer    Pain Onset  More than a month ago         [facility] PT Assessment - 11/10/19 0001      Assessment   Medical Diagnosis  cervicalgia    Referring Provider (PT)  Duffy Rhody                   [facility] Adult PT Treatment/Exercise - 11/10/19 0001      Shoulder Exercises: Supine   Other Supine Exercises  scapular retraction x15, 5" holds - tactile cues    Other Supine Exercises  on towel roll - isometric 5 minutes       Shoulder Exercises: Seated   Other Seated Exercises  practiced sitting in good posture - tactile and verbal cues - feet flat on the floor -prior demo - 8 minutes      Manual Therapy   Manual Therapy  Joint mobilization;Soft tissue mobilization    Manual therapy comments  all manual interventions performed independently of other interventions    Joint Mobilization  medial glides and UPA to C2-7 with and  without cervical ROM - grade II/III     Soft tissue mobilization  STM to B: UT, cervical paraspinals, SCM, anterior scalenes             PT Education - 11/10/19 1747    Education Details  on posture, how slumped posture changes and increases forces/stress on cervical spine. How passive sitting will continue to worsen with time. Educated patient on lumbar support, rationale behind exercises and ideal seated positioning.    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    Time  3    Status  New    Target Date  11/24/19      PT SHORT TERM  GOAL #3   Title  Patient will report at least 25% improvement in overall symptoms to improve QOL.    Time  3    Period  Weeks    Status  New    Target Date  11/24/19        PT Long Term Goals - 11/03/19 0939      PT LONG TERM GOAL #1   Title  Patient will report at least 75% improvement in overall symptoms to improve QOL.    Time  6    Period  Weeks    Status  New    Target Date  12/15/19      PT LONG TERM GOAL #2   Title  Patient will score with les then 40% limitation on FOTO to demonstrate improved functional ability.    Time  6    Period  Weeks    Status  New    Target Date  12/15/19      PT LONG TERM GOAL #3   Title  Patient will demonstrate at least 20 degrees of cervical extension to improve ability to look up.    Time  6    Period  Weeks    Status  New    Target Date  12/15/19            Plan - 11/10/19 1744    Clinical Impression Statement  Session focused on educating patient on posture, force/stress on neck and shoulders with prolonged forward head posture and slumped sacral sitting. Patient verbalized understanding but it is very difficult for patient to correct posture even with verbal and tactile cues. 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 Improved cervical ROT noted but continued pain by end of session. Will continue to work on posture and mobilization as tolerated by patient.    Personal Factors and Comorbidities  Comorbidity 1;Age    Comorbidities  HTN    Examination-Activity Limitations  Sleep;Other   driving and looking up   Examination-Participation Restrictions  Driving;Yard Work;Cleaning    Stability/Clinical Decision Making  Stable/Uncomplicated    Rehab  Potential  Good    PT Frequency  2x / week    PT Duration  6 weeks    PT Treatment/Interventions  ADLs/Self Care Home Management;Biofeedback;Cryotherapy;Electrical Stimulation;Moist Heat;Traction;Therapeutic  exercise;Therapeutic activities;Functional mobility training;Stair training;Gait training;Neuromuscular re-education;Patient/family education;Manual techniques;Dry needling;Passive range of motion;Joint Manipulations    PT Next Visit Plan  review posture and posture based exercises. continue with cervical joint mobilizations, STM to cervical musculature - add cerivcal ROM and shoulder extension as tolerated    PT Home Exercise Plan  self mobilization to SCM and UT    Consulted and Agree with Plan of Care  Patient       Patient will benefit from skilled therapeutic intervention in order to improve the following deficits and impairments:  Pain, Decreased mobility, Hypomobility  Visit Diagnosis: Cervicalgia     Problem List Patient Active Problem List   Diagnosis Date Noted  . Acute pancreatitis 04/25/2018  . Hypertension 04/25/2018  . Elevated lipase 04/25/2018  . Dehydration 04/25/2018  . Alcohol dependence, episodic drinking behavior ([place]) 04/25/2018  . Elevated d-dimer 04/25/2018  . Epigastric abdominal pain 04/25/2018  . Proteinuria 04/25/2018  . Leukocytosis 04/25/2018  . UTI (urinary tract infection) 04/25/2018  . Hyperlipidemia 04/25/2018  . Hyperglycemia 04/25/2018  . Abnormal transaminases 04/25/2018     5:51 PM, 11/10/19 Jerene Pitch, DPT Physical Therapy with [facility]  [phone] office  [place] [address] [place], [place], [address] Phone: [phone]   Fax:  [phone]  Name: Matthew Payne MRN: [id number] Date of Birth: 1945-11-22

## 2019-11-13 ENCOUNTER — Other Ambulatory Visit: Payer: Self-pay

## 2019-11-13 ENCOUNTER — Ambulatory Visit (HOSPITAL_COMMUNITY): Payer: Medicare Other | Admitting: Physical Therapy

## 2019-11-13 DIAGNOSIS — M542 Cervicalgia: Secondary | ICD-10-CM

## 2019-11-13 NOTE — Therapy (Signed)
[place], [place], [address] Phone: [phone]   Fax:  [phone]  Physical Therapy Treatment  Patient Details  Name: Matthew Payne MRN: [id number] Date of Birth: 13-Mar-1946 Referring Provider (PT): Duffy Rhody   Encounter Date: 11/13/2019  PT End of Session - 11/13/19 0931    Visit Number  4    Number of Visits  12    Date for PT Re-Evaluation  12/15/19    Authorization Type  MCR    Authorization Time Period  POC Dates 11/03/19 to 12/15/2019    Authorization - Visit Number  4    Authorization - Number of Visits  10    PT Start Time  [id number]    PT Stop Time  0915    PT Time Calculation (min)  51 min    Activity Tolerance  Patient tolerated treatment well;No increased pain    Behavior During Therapy  WFL for tasks assessed/performed       Past Medical History:  Diagnosis Date  . Hypertension     Past Surgical History:  Procedure Laterality Date  . CATARACT EXTRACTION, BILATERAL  5 yrs ago  . COLONOSCOPY  05/28/2011   Procedure: COLONOSCOPY;  Surgeon: [name], MD;  [facility];  Service: Endoscopy;  Laterality: N/A;  . CYSTECTOMY     back & leg  . INGUINAL HERNIA REPAIR  07/01/2012   Procedure: HERNIA REPAIR INGUINAL ADULT;  Surgeon: [name], MD;  Location: [facility];  Service: General;  Laterality: Right;  Right Inguinal Herniorraphy with Mesh  . Nesbit  Plication    . TONSILLECTOMY      There were no vitals filed for this visit.  Subjective Assessment - 11/13/19 0821    Subjective  Pt states that his neck does not bother him unless he is turning or trying to look up.    Pertinent History  HTN, chronicity of condition    How long can you sit comfortably?  no problems    How long can you stand comfortably?  no problems    How long can you walk comfortably?  no problems    Diagnostic tests  MRI    Patient Stated Goals  to be able to turn to back in trailer    Currently in  Pain?  No/denies    Pain Onset  More than a month ago                       [facility] Adult PT Treatment/Exercise - 11/13/19 0001      Exercises   Exercises  Neck      Neck Exercises: Standing   Other Standing Exercises  Occipit to wall = 10.8 cm       Neck Exercises: Supine   Cervical Rotation  Both;5 reps    Other Supine Exercise  cervical isometric in 30degrees of rotation x 5  B       Neck Exercises: Sidelying   Other Sidelying Exercise  Rt sidelying With hips and knees at 90 degrees rotate cervical and upper thoracic to left    PT is at 30 degrees normal 50; Pt has 50 to right      Manual Therapy   Manual Therapy  Joint mobilization;Soft tissue mobilization    Manual therapy comments  all manual interventions performed independently of other interventions    Joint Mobilization  medial glides and UPA to C2-7 with and  without cervical ROM - grade II/III     Soft tissue mobilization  STM to B: UT, cervical paraspinals, SCM, anterior scalenes               PT Short Term Goals - 11/13/19 [id number]      PT SHORT TERM GOAL #1   Title  Patient will be independent in HEP  to improve functional outcomes    Time  3    Period  Weeks    Status  On-going    Target Date  11/24/19      PT SHORT TERM GOAL #2   Title  Patient will consistently demonstrate at least 45 degrees of cervical 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    Time  3    Status  On-going    Target Date  11/24/19      PT SHORT TERM GOAL #3   Title  Patient will report at least 25% improvement in overall symptoms to improve QOL.    Time  3    Period  Weeks    Status  On-going    Target Date  11/24/19        PT Long Term Goals - 11/13/19 [id number]      PT LONG TERM GOAL #1   Title  Patient will report at least 75% improvement in overall symptoms to improve QOL.    Time  6    Period  Weeks    Status  On-going      PT LONG TERM GOAL #2   Title  Patient will score  with les then 40% limitation on FOTO to demonstrate improved functional ability.    Time  6    Period  Weeks    Status  On-going      PT LONG TERM GOAL #3   Title  Patient will demonstrate at least 20 degrees of cervical extension to improve ability to look up.    Time  6    Period  Weeks    Status  On-going            Plan - 11/13/19 0932    Clinical Impression Statement  Continued to focus on posture with pt occiput to wall measuring 10.8.  Therapist began instruction for cervical and Upper mobility as well as strength.  PT continues to be more limited on LT than on RT with both ROM as well as strength.    Personal Factors and Comorbidities  Comorbidity 1;Age    Comorbidities  HTN    Examination-Activity Limitations  Sleep;Other   driving and looking up   Examination-Participation Restrictions  Driving;Yard Work;Cleaning    Stability/Clinical Decision Making  Stable/Uncomplicated    Rehab Potential  Good    PT Frequency  2x / week    PT Duration  6 weeks    PT Treatment/Interventions  ADLs/Self Care Home Management;Biofeedback;Cryotherapy;Electrical Stimulation;Moist Heat;Traction;Therapeutic exercise;Therapeutic activities;Functional mobility training;Stair training;Gait training;Neuromuscular re-education;Patient/family education;Manual techniques;Dry needling;Passive range of motion;Joint Manipulations    PT Next Visit Plan  review posture and posture based exercises. continue with cervical joint mobilizations, STM to cervical musculature - add cerivcal ROM and shoulder extension as tolerated    PT Home Exercise Plan  self mobilization to SCM and UT; supine cervical rotation and rotation isometrics at 30 degrees, decompression exercises for improved posture and sidelying LT cervical/upper thoracic rotation/extension exercise.    Consulted and Agree with Plan of Care  Patient  Patient will benefit from skilled therapeutic intervention in order to improve the following  deficits and impairments:  Pain, Decreased mobility, Hypomobility  Visit Diagnosis: Cervicalgia     Problem List Patient Active Problem List   Diagnosis Date Noted  . Acute pancreatitis 04/25/2018  . Hypertension 04/25/2018  . Elevated lipase 04/25/2018  . Dehydration 04/25/2018  . Alcohol dependence, episodic drinking behavior ([place]) 04/25/2018  . Elevated d-dimer 04/25/2018  . Epigastric abdominal pain 04/25/2018  . Proteinuria 04/25/2018  . Leukocytosis 04/25/2018  . UTI (urinary tract infection) 04/25/2018  . Hyperlipidemia 04/25/2018  . Hyperglycemia 04/25/2018  . Abnormal transaminases 04/25/2018    Rayetta Humphrey, PT CLT [phone] 11/13/2019, 9:39 AM  [place] [address] [place], [place], [address] Phone: [phone]   Fax:  [phone]  Name: LEDARRIUS ROSATI MRN: [id number] Date of Birth: 1946-06-24

## 2019-11-15 ENCOUNTER — Telehealth: Payer: Self-pay | Admitting: Urology

## 2019-11-15 NOTE — Telephone Encounter (Signed)
Patient calle dto schedule appt for March 26th for a f/u appt. He asks that we mail his lab slip to him if he needs labs done .

## 2019-11-15 NOTE — Telephone Encounter (Signed)
Per urochart note on pt- 51month for fr and pvr . Pt notified no lab work needed.

## 2019-11-17 ENCOUNTER — Other Ambulatory Visit: Payer: Self-pay

## 2019-11-17 ENCOUNTER — Ambulatory Visit (HOSPITAL_COMMUNITY): Payer: Medicare Other | Admitting: Physical Therapy

## 2019-11-17 ENCOUNTER — Encounter (HOSPITAL_COMMUNITY): Payer: Self-pay | Admitting: Physical Therapy

## 2019-11-17 DIAGNOSIS — M542 Cervicalgia: Secondary | ICD-10-CM | POA: Diagnosis not present

## 2019-11-17 NOTE — Therapy (Signed)
[place], [place], [address] Phone: [phone]   Fax:  [phone]  Physical Therapy Treatment  Patient Details  Name: Matthew Payne MRN: [id number] Date of Birth: [birth year] Referring Provider (PT): Duffy Rhody   Encounter Date: 11/17/2019  PT End of Session - 11/17/19 0905    Visit Number  5    Number of Visits  12    Date for PT Re-Evaluation  12/15/19    Authorization Type  MCR    Authorization Time Period  POC Dates 11/03/19 to 12/15/2019    Authorization - Visit Number  5    Authorization - Number of Visits  10    PT Start Time  0820    PT Stop Time  0901    PT Time Calculation (min)  41 min    Activity Tolerance  Patient tolerated treatment well;No increased pain    Behavior During Therapy  WFL for tasks assessed/performed       Past Medical History:  Diagnosis Date  . Hypertension     Past Surgical History:  Procedure Laterality Date  . CATARACT EXTRACTION, BILATERAL  5 yrs ago  . COLONOSCOPY  05/28/2011   Procedure: COLONOSCOPY;  Surgeon: [name], MD;  [facility];  Service: Endoscopy;  Laterality: N/A;  . CYSTECTOMY     back & leg  . INGUINAL HERNIA REPAIR  07/01/2012   Procedure: HERNIA REPAIR INGUINAL ADULT;  Surgeon: [name], MD;  Location: [facility];  Service: General;  Laterality: Right;  Right Inguinal Herniorraphy with Mesh  . Nesbit  Plication    . TONSILLECTOMY      There were no vitals filed for this visit.  Subjective Assessment - 11/17/19 0820    Subjective  Patient reports things have been going better. It is still sore to move it but he is able to move it further with it being more sore turning to L side. His exercises at home have been helping and they are going well.    Pertinent History  HTN, chronicity of condition    How long can you sit comfortably?  no problems    How long can you stand comfortably?  no problems    How long can you walk  comfortably?  no problems    Diagnostic tests  MRI    Patient Stated Goals  to be able to turn to back in trailer    Currently in Pain?  Yes    Pain Score  4     Pain Location  Neck    Pain Onset  More than a month ago                       [facility] Adult PT Treatment/Exercise - 11/17/19 0001      Neck Exercises: Seated   Other Seated Exercise  SNAGs with towel for L and R rotation 10x5 second holds bilateral       Neck Exercises: Supine   Neck Retraction  10 reps;3 secs    Neck Retraction Limitations  2 sets      Manual Therapy   Manual Therapy  Joint mobilization;Soft tissue mobilization    Manual therapy comments  all manual interventions performed independently of other interventions    Joint Mobilization  medial glides and R and L UPA to C2-7 with and without cervical ROM - grade II/III     Soft tissue mobilization  STM  to B: UT, cervical paraspinals, SCM, anterior scalenes             PT Education - 11/17/19 0905    Education Details  Patient educated on completing HEP    Person(s) Educated  Patient    Methods  Explanation;Handout    Comprehension  Verbalized understanding       PT Short Term Goals - 11/13/19 0938      PT SHORT TERM GOAL #1   Title  Patient will be independent in HEP  to improve functional outcomes    Time  3    Period  Weeks    Status  On-going    Target Date  11/24/19      PT SHORT TERM GOAL #2   Title  Patient will consistently demonstrate at least 45 degrees of cervical 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    Time  3    Status  On-going    Target Date  11/24/19      PT SHORT TERM GOAL #3   Title  Patient will report at least 25% improvement in overall symptoms to improve QOL.    Time  3    Period  Weeks    Status  On-going    Target Date  11/24/19        PT Long Term Goals - 11/13/19 [id number]      PT LONG TERM GOAL #1   Title  Patient will report at least 75% improvement in overall  symptoms to improve QOL.    Time  6    Period  Weeks    Status  On-going      PT LONG TERM GOAL #2   Title  Patient will score with les then 40% limitation on FOTO to demonstrate improved functional ability.    Time  6    Period  Weeks    Status  On-going      PT LONG TERM GOAL #3   Title  Patient will demonstrate at least 20 degrees of cervical extension to improve ability to look up.    Time  6    Period  Weeks    Status  On-going            Plan - 11/17/19 0906    Clinical Impression Statement  Patient experiencing reduction in symptoms following manual therapy interventions today. He notes improved mobility following. He has c/o increased tenderness with cervical retractions which improves with repetition. He requires frequent verbal cueing for decreased intensity of contraction to isolate appropriate musculature. He requires min verbal cueing to perform SNAG exercise for improved rotation. He demonstrates improved rotation following as well as states improved pain and stiffness at end of session. Patient will continue to benefit from skilled physical therapy in order to improve function and reduce impairment.    Personal Factors and Comorbidities  Comorbidity 1;Age    Comorbidities  HTN    Examination-Activity Limitations  Sleep;Other   driving and looking up   Examination-Participation Restrictions  Driving;Yard Work;Cleaning    Stability/Clinical Decision Making  Stable/Uncomplicated    Rehab Potential  Good    PT Frequency  2x / week    PT Duration  6 weeks    PT Treatment/Interventions  ADLs/Self Care Home Management;Biofeedback;Cryotherapy;Electrical Stimulation;Moist Heat;Traction;Therapeutic exercise;Therapeutic activities;Functional mobility training;Stair training;Gait training;Neuromuscular re-education;Patient/family education;Manual techniques;Dry needling;Passive range of motion;Joint Manipulations    PT Next Visit Plan  review posture and posture based  exercises.  continue with cervical joint mobilizations, STM to cervical musculature - add cerivcal ROM and shoulder extension as tolerated    PT Home Exercise Plan  self mobilization to SCM and UT; supine cervical rotation and rotation isometrics at 30 degrees, decompression exercises for improved posture and sidelying LT cervical/upper thoracic rotation/extension exercise. 11/17/19 SNAG rotation    Consulted and Agree with Plan of Care  Patient       Patient will benefit from skilled therapeutic intervention in order to improve the following deficits and impairments:  Pain, Decreased mobility, Hypomobility  Visit Diagnosis: Cervicalgia     Problem List Patient Active Problem List   Diagnosis Date Noted  . Acute pancreatitis 04/25/2018  . Hypertension 04/25/2018  . Elevated lipase 04/25/2018  . Dehydration 04/25/2018  . Alcohol dependence, episodic drinking behavior ([place]) 04/25/2018  . Elevated d-dimer 04/25/2018  . Epigastric abdominal pain 04/25/2018  . Proteinuria 04/25/2018  . Leukocytosis 04/25/2018  . UTI (urinary tract infection) 04/25/2018  . Hyperlipidemia 04/25/2018  . Hyperglycemia 04/25/2018  . Abnormal transaminases 04/25/2018    9:10 AM, 11/17/19 Mearl Latin PT, DPT Physical Therapist at [place], [place], [address] Phone: [phone]   Fax:  [phone]  Name: Matthew Payne MRN: [id number] Date of Birth: 11-15-1945

## 2019-11-20 ENCOUNTER — Other Ambulatory Visit: Payer: Self-pay

## 2019-11-20 ENCOUNTER — Ambulatory Visit (HOSPITAL_COMMUNITY): Payer: Medicare Other | Attending: Neurosurgery | Admitting: Physical Therapy

## 2019-11-20 ENCOUNTER — Encounter (HOSPITAL_COMMUNITY): Payer: Self-pay | Admitting: Physical Therapy

## 2019-11-20 DIAGNOSIS — M542 Cervicalgia: Secondary | ICD-10-CM | POA: Diagnosis not present

## 2019-11-20 NOTE — Therapy (Signed)
[place], [place], [address] Phone: [phone]   Fax:  [phone]  Physical Therapy Treatment  Patient Details  Name: Matthew Payne MRN: [id number] Date of Birth: [birth year] Referring Provider (PT): Duffy Rhody   Encounter Date: 11/20/2019  PT End of Session - 11/20/19 0929    Visit Number  6    Number of Visits  12    Date for PT Re-Evaluation  12/15/19    Authorization Type  MCR    Authorization Time Period  POC Dates 11/03/19 to 12/15/2019    Authorization - Visit Number  6    Authorization - Number of Visits  10    PT Start Time  0830    PT Stop Time  0910    PT Time Calculation (min)  40 min    Activity Tolerance  Patient tolerated treatment well;No increased pain    Behavior During Therapy  WFL for tasks assessed/performed       Past Medical History:  Diagnosis Date  . Hypertension     Past Surgical History:  Procedure Laterality Date  . CATARACT EXTRACTION, BILATERAL  5 yrs ago  . COLONOSCOPY  05/28/2011   Procedure: COLONOSCOPY;  Surgeon: [name], MD;  [facility];  Service: Endoscopy;  Laterality: N/A;  . CYSTECTOMY     back & leg  . INGUINAL HERNIA REPAIR  07/01/2012   Procedure: HERNIA REPAIR INGUINAL ADULT;  Surgeon: [name], MD;  Location: [facility];  Service: General;  Laterality: Right;  Right Inguinal Herniorraphy with Mesh  . Nesbit  Plication    . TONSILLECTOMY      There were no vitals filed for this visit.  Subjective Assessment - 11/20/19 0830    Subjective  PT states that he worked on an old car yesterday which flared his  neck up a little bit.    Pertinent History  HTN, chronicity of condition    How long can you sit comfortably?  no problems    How long can you stand comfortably?  no problems    How long can you walk comfortably?  no problems    Diagnostic tests  MRI    Patient Stated Goals  to be able to turn to back in trailer    Currently in  Pain?  Yes    Pain Score  2     Pain Location  Neck    Pain Orientation  Left    Pain Descriptors / Indicators  Aching    Pain Type  Chronic pain    Pain Radiating Towards  to shoulder    Pain Onset  More than a month ago    Pain Frequency  Intermittent                       [facility] Adult PT Treatment/Exercise - 11/20/19 0001      Exercises   Exercises  Neck      Neck Exercises: Seated   Neck Retraction  10 reps    Neck Retraction Limitations  scapular retraction 10    Other Seated Exercise  cervical and thoracic excursion       Neck Exercises: Supine   Cervical Isometrics Limitations  at end range for contract relax to increase ROM B     Neck Retraction  10 reps;3 secs    Neck Retraction Limitations  2 sets      Manual Therapy  Manual Therapy  Joint mobilization;Soft tissue mobilization    Manual therapy comments  all manual interventions performed independently of other interventions    Joint Mobilization  medial glides and R and L UPA to C2-7 with and without cervical ROM - grade II/III     Soft tissue mobilization  STM to B: UT, cervical paraspinals, SCM, anterior scalenes             PT Education - 11/20/19 0928    Education Details  The increased pressure your discs are under with forward head position.    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    Time  3    Status  Achieved    Target Date  11/24/19      PT SHORT TERM GOAL #3   Title  Patient will report at least 25% improvement in  overall symptoms to improve QOL.    Time  3    Period  Weeks    Status  On-going    Target Date  11/24/19        PT Long Term Goals - 11/20/19 0933      PT LONG TERM GOAL #1   Title  Patient will report at least 75% improvement in overall symptoms to improve QOL.    Time  6    Period  Weeks    Status  On-going      PT LONG TERM GOAL #2   Title  Patient will score with les then 40% limitation on FOTO to demonstrate improved functional ability.    Time  6    Period  Weeks    Status  On-going      PT LONG TERM GOAL #3   Title  Patient will demonstrate at least 20 degrees of cervical extension to improve ability to look up.    Time  6    Period  Weeks    Status  On-going            Plan - 11/20/19 0930    Clinical Impression Statement  Pt encouraged to complete axial extension exercises throughout the day and to use neck rest in car when driving.  PT instructed in cervical and thoracic excursion exercises to improve ROM.  Noted tightness with manual at all end ranges of motion B; LT greater than RT    Personal Factors and Comorbidities  Comorbidity 1;Age    Comorbidities  HTN    Examination-Activity Limitations  Sleep;Other   driving and looking up   Examination-Participation Restrictions  Driving;Yard Work;Cleaning    Stability/Clinical Decision Making  Stable/Uncomplicated    Rehab Potential  Good    PT Frequency  2x / week    PT Duration  6 weeks    PT Treatment/Interventions  ADLs/Self Care Home Management;Biofeedback;Cryotherapy;Electrical Stimulation;Moist Heat;Traction;Therapeutic exercise;Therapeutic activities;Functional mobility training;Stair training;Gait training;Neuromuscular re-education;Patient/family education;Manual techniques;Dry  needling;Passive range of motion;Joint Manipulations    PT Next Visit Plan  review posture and posture based exercises. continue with cervical joint mobilizations, STM to cervical musculature - add cerivcal ROM and shoulder  extension as tolerated    PT Home Exercise Plan  self mobilization to SCM and UT; supine cervical rotation and rotation isometrics at 30 degrees, decompression exercises for improved posture and sidelying LT cervical/upper thoracic rotation/extension exercise. 11/17/19 SNAG rotation; 2/1: Cervical and thoracic excursion    Consulted and Agree with Plan of Care  Patient       Patient will benefit from skilled therapeutic intervention in order to improve the following deficits and impairments:  Pain, Decreased mobility, Hypomobility  Visit Diagnosis: Cervicalgia     Problem List Patient Active Problem List   Diagnosis Date Noted  . Acute pancreatitis 04/25/2018  . Hypertension 04/25/2018  . Elevated lipase 04/25/2018  . Dehydration 04/25/2018  . Alcohol dependence, episodic drinking behavior ([place]) 04/25/2018  . Elevated d-dimer 04/25/2018  . Epigastric abdominal pain 04/25/2018  . Proteinuria 04/25/2018  . Leukocytosis 04/25/2018  . UTI (urinary tract infection) 04/25/2018  . Hyperlipidemia 04/25/2018  . Hyperglycemia 04/25/2018  . Abnormal transaminases 04/25/2018    Rayetta Humphrey, PT CLT [phone] 11/20/2019, 9:34 AM  [facility] [address] [place], [place], [address] Phone: [phone]   Fax:  [phone]  Name: SEDRICK VICTORINO MRN: [id number] Date of Birth: 1946/06/02

## 2019-11-24 ENCOUNTER — Ambulatory Visit (HOSPITAL_COMMUNITY): Payer: Medicare Other

## 2019-11-24 ENCOUNTER — Other Ambulatory Visit: Payer: Self-pay

## 2019-11-24 ENCOUNTER — Encounter (HOSPITAL_COMMUNITY): Payer: Self-pay

## 2019-11-24 DIAGNOSIS — M542 Cervicalgia: Secondary | ICD-10-CM

## 2019-11-24 NOTE — Therapy (Signed)
[place], [place], [address] Phone: [phone]   Fax:  [phone]  Physical Therapy Treatment  Patient Details  Name: Matthew Payne MRN: [id number] Date of Birth: 04-Apr-1946 Referring Provider (PT): Duffy Rhody   Encounter Date: 11/24/2019  PT End of Session - 11/24/19 0823    Visit Number  7    Number of Visits  12    Date for PT Re-Evaluation  12/15/19    Authorization Type  MCR    Authorization Time Period  POC Dates 11/03/19 to 12/15/2019    Authorization - Visit Number  7    Authorization - Number of Visits  10    PT Start Time  0825    PT Stop Time  0910    PT Time Calculation (min)  45 min    Activity Tolerance  Patient tolerated treatment well;No increased pain    Behavior During Therapy  WFL for tasks assessed/performed       Past Medical History:  Diagnosis Date  . Hypertension     Past Surgical History:  Procedure Laterality Date  . CATARACT EXTRACTION, BILATERAL  5 yrs ago  . COLONOSCOPY  05/28/2011   Procedure: COLONOSCOPY;  Surgeon: [name], MD;  [facility];  Service: Endoscopy;  Laterality: N/A;  . CYSTECTOMY     back & leg  . INGUINAL HERNIA REPAIR  07/01/2012   Procedure: HERNIA REPAIR INGUINAL ADULT;  Surgeon: [name], MD;  Location: [facility];  Service: General;  Laterality: Right;  Right Inguinal Herniorraphy with Mesh  . Nesbit  Plication    . TONSILLECTOMY      There were no vitals filed for this visit.  Subjective Assessment - 11/24/19 0827    Subjective  Pt reports "maybe 3/10 in my neck". Pt reports he can't tell if there is any improvement, turning is still painful.    Pertinent History  HTN, chronicity of condition    How long can you sit comfortably?  no problems    How long can you stand comfortably?  no problems    How long can you walk comfortably?  no problems    Diagnostic tests  MRI    Patient Stated Goals  to be able to turn to back  in trailer    Currently in Pain?  Yes    Pain Score  3     Pain Location  Neck    Pain Orientation  Left    Pain Descriptors / Indicators  Aching    Pain Type  Chronic pain    Pain Onset  More than a month ago    Pain Frequency  Intermittent    Aggravating Factors   moving neck    Pain Relieving Factors  not moving neck              [facility] Adult PT Treatment/Exercise - 11/24/19 0001      Neck Exercises: Standing   Neck Retraction  15 reps    Neck Retraction Limitations  with wall at back, constant verbal cues to tuck chin      Neck Exercises: Seated   Cervical Rotation  Both;10 reps    Cervical Rotation Limitations  pain-free range    Lateral Flexion  Both;10 reps    Lateral Flexion Limitations  pain-free range    Shoulder Rolls  Backwards;Forwards;10 reps      Neck Exercises: Supine   Neck Retraction  5 reps;3  secs    Neck Retraction Limitations  2 sets, constant verbal/tactile cues for form      Shoulder Exercises: Standing   Extension  15 reps    Theraband Level (Shoulder Extension)  Level 2 (Red)    Extension Limitations  isometric hold at end range, verbal cues for posture    Retraction  15 reps    Theraband Level (Shoulder Retraction)  Level 2 (Red)    Retraction Limitations  verbal cues for posture, tactile cues for form    Other Standing Exercises  snow angels (w), verbal cues to keep elbows and hands in line, x15 reps      Manual Therapy   Manual Therapy  Soft tissue mobilization    Manual therapy comments  all manual interventions performed independently of other interventions    Soft tissue mobilization  Pt supine, STM to bil upper traps, anterior scalenes, and SCM tp reduce pain and improve muscle extensibility      Neck Exercises: Stretches   Corner Stretch  3 reps;30 seconds             PT Education - 11/24/19 0823    Education Details  Exercise technique, continue  HEP    Person(s) Educated  Patient    Methods  Explanation     Comprehension  Verbalized understanding       PT Short Term Goals - 11/20/19 0933      PT SHORT TERM GOAL #1   Title  Patient will be independent in HEP  to improve functional outcomes    Time  3    Period  Weeks    Status  Achieved    Target Date  11/24/19      PT SHORT TERM GOAL #2   Title  Patient will consistently demonstrate at least 45 degrees of cervical 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    Time  3    Status  Achieved    Target Date  11/24/19      PT SHORT TERM GOAL #3   Title  Patient will report at least 25% improvement in overall symptoms to improve QOL.    Time  3    Period  Weeks    Status  On-going    Target Date  11/24/19        PT Long Term Goals - 11/20/19 0933      PT LONG TERM GOAL #1   Title  Patient will report at least 75% improvement in overall symptoms to improve QOL.    Time  6    Period  Weeks    Status  On-going      PT LONG TERM GOAL #2   Title  Patient will score with les then 40% limitation on FOTO to demonstrate improved functional ability.    Time  6    Period  Weeks    Status  On-going      PT LONG TERM GOAL #3   Title  Patient will demonstrate at least 20 degrees of cervical extension to improve ability to look up.    Time  6    Period  Weeks    Status  On-going            Plan - 11/24/19 [id number]    Clinical Impression Statement  Began treatment with cervical AROM exercises, pt requires constant verbal cues to decrease trunk compensations and to reduce range of motion to respect pain and  avoid increasing discomfort. Performed neck retraction in standing with wall at back for tactile cues, continues to require cueing to avoid neck extension with retraction. Added standing postural strengthening with verbal cues for form. Ended with STM to reduce pain and improve mobility. Pt reports "pain is a little better" after STM, but no significant improvement in mobility. Reviewed chin tucks/neck retraction  exercise again with good performance for HEP consistency. Continue to progress as able.    Personal Factors and Comorbidities  Comorbidity 1;Age    Comorbidities  HTN    Examination-Activity Limitations  Sleep;Other   driving and looking up   Examination-Participation Restrictions  Driving;Yard Work;Cleaning    Stability/Clinical Decision Making  Stable/Uncomplicated    Rehab Potential  Good    PT Frequency  2x / week    PT Duration  6 weeks    PT Treatment/Interventions  ADLs/Self Care Home Management;Biofeedback;Cryotherapy;Electrical Stimulation;Moist Heat;Traction;Therapeutic exercise;Therapeutic activities;Functional mobility training;Stair training;Gait training;Neuromuscular re-education;Patient/family education;Manual techniques;Dry needling;Passive range of motion;Joint Manipulations    PT Next Visit Plan  Continue posture strengthening exercises. Usecervical joint mobilizations, STM to cervical musculature for pain relief as needed.    PT Home Exercise Plan  self mobilization to SCM and UT; supine cervical rotation and rotation isometrics at 30 degrees, decompression exercises for improved posture and sidelying LT cervical/upper thoracic rotation/extension exercise. 11/17/19 SNAG rotation; 2/1: Cervical and thoracic excursion    Consulted and Agree with Plan of Care  Patient       Patient will benefit from skilled therapeutic intervention in order to improve the following deficits and impairments:  Pain, Decreased mobility, Hypomobility  Visit Diagnosis: Cervicalgia     Problem List Patient Active Problem List   Diagnosis Date Noted  . Acute pancreatitis 04/25/2018  . Hypertension 04/25/2018  . Elevated lipase 04/25/2018  . Dehydration 04/25/2018  . Alcohol dependence, episodic drinking behavior ([place]) 04/25/2018  . Elevated d-dimer 04/25/2018  . Epigastric abdominal pain 04/25/2018  . Proteinuria 04/25/2018  . Leukocytosis 04/25/2018  . UTI (urinary tract infection)  04/25/2018  . Hyperlipidemia 04/25/2018  . Hyperglycemia 04/25/2018  . Abnormal transaminases 04/25/2018     Talbot Grumbling PT, DPT 11/24/19, 9:14 AM [facility] [address] [place], [place], [address] Phone: [phone]   Fax:  [phone]  Name: Matthew Payne MRN: [id number] Date of Birth: [birth year]

## 2019-11-27 ENCOUNTER — Encounter (HOSPITAL_COMMUNITY): Payer: Self-pay | Admitting: Physical Therapy

## 2019-11-27 ENCOUNTER — Other Ambulatory Visit: Payer: Self-pay

## 2019-11-27 ENCOUNTER — Ambulatory Visit (HOSPITAL_COMMUNITY): Payer: Medicare Other | Admitting: Physical Therapy

## 2019-11-27 DIAGNOSIS — M542 Cervicalgia: Secondary | ICD-10-CM

## 2019-11-27 NOTE — Therapy (Signed)
[place], [place], [address] Phone: [phone]   Fax:  [phone]  Physical Therapy Treatment  Patient Details  Name: Matthew Payne MRN: [id number] Date of Birth: Oct 12, 1946 Referring Provider (PT): Duffy Rhody   Encounter Date: 11/27/2019  PT End of Session - 11/27/19 0836    Visit Number  8    Number of Visits  12    Date for PT Re-Evaluation  12/15/19    Authorization Type  MCR    Authorization Time Period  POC Dates 11/03/19 to 12/15/2019    Authorization - Visit Number  8    Authorization - Number of Visits  10    PT Start Time  0830    PT Stop Time  0910    PT Time Calculation (min)  40 min    Activity Tolerance  Patient tolerated treatment well;No increased pain    Behavior During Therapy  WFL for tasks assessed/performed       Past Medical History:  Diagnosis Date  . Hypertension     Past Surgical History:  Procedure Laterality Date  . CATARACT EXTRACTION, BILATERAL  5 yrs ago  . COLONOSCOPY  05/28/2011   Procedure: COLONOSCOPY;  Surgeon: [name], MD;  [facility];  Service: Endoscopy;  Laterality: N/A;  . CYSTECTOMY     back & leg  . INGUINAL HERNIA REPAIR  07/01/2012   Procedure: HERNIA REPAIR INGUINAL ADULT;  Surgeon: [name], MD;  Location: [facility];  Service: General;  Laterality: Right;  Right Inguinal Herniorraphy with Mesh  . Nesbit  Plication    . TONSILLECTOMY      There were no vitals filed for this visit.  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 States he tried his exercises and some of them feel better. States he was turning good but then his pain started back up. States overall he feels it is getting better (motion) but it still tightens up.    Pertinent History  HTN, chronicity of condition    How long can you sit comfortably?   no problems    How long can you stand comfortably?  no problems    How long can you walk comfortably?  no problems    Diagnostic tests  MRI    Patient Stated Goals  to be able to turn to back in trailer    Pain Score  3     Pain Location  Neck    Pain Orientation  Right;Left    Pain Descriptors / Indicators  Aching    Pain Onset  More than a month ago         [facility] PT Assessment - 11/27/19 0001      Assessment   Medical Diagnosis  cervicalgia    Referring Provider (PT)  Duffy Rhody                   [facility] Adult PT Treatment/Exercise - 11/27/19 0001      Neck Exercises: Supine   Other Supine Exercise  serratus punches 3x12 3"    Other Supine Exercise  2 tennis balls in pillowcase at cervical spine - with rotation and extension - 6 minutes      Neck Exercises: Prone   Other Prone Exercise  UE on table scapular retracttion and  protraction 3x5, 3" holds B      Manual Therapy   Manual Therapy  Joint mobilization;Soft tissue mobilization;Manual Traction    Manual therapy comments  all manual interventions performed independently of other interventions    Joint Mobilization  UPA on cervical spine with cervical ROT grade III    Soft tissue mobilization  STM to cervical paraspinals, suboccipitals, and UT    Manual Traction  cervical traction - manual with ROT -tolerated well                PT Short Term Goals - 11/20/19 0933      PT SHORT TERM GOAL #1   Title  Patient will be independent in HEP  to improve functional outcomes    Time  3    Period  Weeks    Status  Achieved    Target Date  11/24/19      PT SHORT TERM GOAL #2   Title  Patient will consistently demonstrate at least 45 degrees of cervical 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    Time  3    Status  Achieved    Target Date  11/24/19      PT SHORT TERM GOAL #3   Title  Patient will report at least 25% improvement in overall symptoms to improve QOL.    Time   3    Period  Weeks    Status  On-going    Target Date  11/24/19        PT Long Term Goals - 11/20/19 0933      PT LONG TERM GOAL #1   Title  Patient will report at least 75% improvement in overall symptoms to improve QOL.    Time  6    Period  Weeks    Status  On-going      PT LONG TERM GOAL #2   Title  Patient will score with les then 40% limitation on FOTO to demonstrate improved functional ability.    Time  6    Period  Weeks    Status  On-going      PT LONG TERM GOAL #3   Title  Patient will demonstrate at least 20 degrees of cervical extension to improve ability to look up.    Time  6    Period  Weeks    Status  On-going            Plan - 11/27/19 [id number]    Clinical Impression Statement  Focused on improving cervical mobility and decreasing pain today. Patient tolerated this well and demonstrated improved cervical mobility afterwards. Added self mobilization exercise as well as scapular protraction and retraction exercise to help with posture. Will continue to benefit from skilled physical therapy at this time.    Personal Factors and Comorbidities  Comorbidity 1;Age    Comorbidities  HTN    Examination-Activity Limitations  Sleep;Other   driving and looking up   Examination-Participation Restrictions  Driving;Yard Work;Cleaning    Stability/Clinical Decision Making  Stable/Uncomplicated    Rehab Potential  Good    PT Frequency  2x / week    PT Duration  6 weeks    PT Treatment/Interventions  ADLs/Self Care Home Management;Biofeedback;Cryotherapy;Electrical Stimulation;Moist Heat;Traction;Therapeutic exercise;Therapeutic activities;Functional mobility training;Stair training;Gait training;Neuromuscular re-education;Patient/family education;Manual techniques;Dry needling;Passive range of motion;Joint Manipulations    PT Next Visit Plan  Continue posture strengthening exercises. Usecervical joint mobilizations, STM to cervical musculature for pain relief as  needed.     PT Home Exercise Plan  self mobilization to SCM and UT; supine cervical rotation and rotation isometrics at 30 degrees, decompression exercises for improved posture and sidelying LT cervical/upper thoracic rotation/extension exercise. 11/17/19 SNAG rotation; 2/1: Cervical and thoracic excursion    Consulted and Agree with Plan of Care  Patient       Patient will benefit from skilled therapeutic intervention in order to improve the following deficits and impairments:  Pain, Decreased mobility, Hypomobility  Visit Diagnosis: Cervicalgia     Problem List Patient Active Problem List   Diagnosis Date Noted  . Acute pancreatitis 04/25/2018  . Hypertension 04/25/2018  . Elevated lipase 04/25/2018  . Dehydration 04/25/2018  . Alcohol dependence, episodic drinking behavior ([place]) 04/25/2018  . Elevated d-dimer 04/25/2018  . Epigastric abdominal pain 04/25/2018  . Proteinuria 04/25/2018  . Leukocytosis 04/25/2018  . UTI (urinary tract infection) 04/25/2018  . Hyperlipidemia 04/25/2018  . Hyperglycemia 04/25/2018  . Abnormal transaminases 04/25/2018    9:15 AM, 11/27/19 Jerene Pitch, DPT Physical Therapy with [facility]  [phone] office  [place] [address] [place], [place], [address] Phone: [phone]   Fax:  [phone]  Name: HIAWATHA LONGINO MRN: [id number] Date of Birth: [birth year]

## 2019-12-01 ENCOUNTER — Encounter (HOSPITAL_COMMUNITY): Payer: Self-pay | Admitting: Physical Therapy

## 2019-12-01 ENCOUNTER — Ambulatory Visit (HOSPITAL_COMMUNITY): Payer: Medicare Other | Admitting: Physical Therapy

## 2019-12-01 ENCOUNTER — Other Ambulatory Visit: Payer: Self-pay

## 2019-12-01 DIAGNOSIS — M542 Cervicalgia: Secondary | ICD-10-CM | POA: Diagnosis not present

## 2019-12-01 NOTE — Therapy (Signed)
[place], [place], [address] Phone: [phone]   Fax:  [phone]  Physical Therapy Treatment  Patient Details  Name: Matthew Payne MRN: [id number] Date of Birth: 04/11/1946 Referring Provider (PT): Duffy Rhody   Encounter Date: 12/01/2019  PT End of Session - 12/01/19 1244    Visit Number  9    Number of Visits  12    Date for PT Re-Evaluation  12/15/19    Authorization Type  MCR    Authorization Time Period  POC Dates 11/03/19 to 12/15/2019    Authorization - Visit Number  9    Authorization - Number of Visits  10    PT Start Time  0830    PT Stop Time  0915    PT Time Calculation (min)  45 min    Activity Tolerance  Patient tolerated treatment well;No increased pain    Behavior During Therapy  WFL for tasks assessed/performed       Past Medical History:  Diagnosis Date  . Hypertension     Past Surgical History:  Procedure Laterality Date  . CATARACT EXTRACTION, BILATERAL  5 yrs ago  . COLONOSCOPY  05/28/2011   Procedure: COLONOSCOPY;  Surgeon: [name], MD;  [facility];  Service: Endoscopy;  Laterality: N/A;  . CYSTECTOMY     back & leg  . INGUINAL HERNIA REPAIR  07/01/2012   Procedure: HERNIA REPAIR INGUINAL ADULT;  Surgeon: [name], MD;  Location: [facility];  Service: General;  Laterality: Right;  Right Inguinal Herniorraphy with Mesh  . Nesbit  Plication    . TONSILLECTOMY      There were no vitals filed for this visit.  Subjective Assessment - 12/01/19 0829    Subjective  states he has more motion in his neck but it is still painful only about 2/10 pain with motion. the neck exercises with motion help the most.    Pertinent History  HTN, chronicity of condition    How long can you sit comfortably?  no problems    How long can you stand comfortably?  no problems    How long can you walk comfortably?  no problems    Diagnostic tests  MRI    Patient Stated Goals   to be able to turn to back in trailer    Pain Onset  More than a month ago         [facility] PT Assessment - 12/01/19 0001      Assessment   Medical Diagnosis  cervicalgia    Referring Provider (PT)  Duffy Rhody                   [facility] Adult PT Treatment/Exercise - 12/01/19 0001      Neck Exercises: Seated   Other Seated Exercise  UT stretch- not toleratd, levator stretch x5 10" holds B    Other Seated Exercise  SNAGs cervical extension and ROT x10 Each B      Manual Therapy   Manual Therapy  Joint mobilization;Soft tissue mobilization;Manual Traction    Manual therapy comments  all manual interventions performed independently of other interventions    Joint Mobilization  AP and medial glides to left cervical spine grade III with and without ROM - tolerated well     Soft tissue mobilization  STM to cervical paraspinals, suboccipitals, and UT    Manual Traction  cervical traction - manual with and without ROT -  tolerated well              PT Education - 12/01/19 1244    Education Details  on possible benefits of self traction unit (inflatable)    Person(s) Educated  Patient    Methods  Explanation    Comprehension  Verbalized understanding       PT Short Term Goals - 11/20/19 0933      PT SHORT TERM GOAL #1   Title  Patient will be independent in HEP  to improve functional outcomes    Time  3    Period  Weeks    Status  Achieved    Target Date  11/24/19      PT SHORT TERM GOAL #2   Title  Patient will consistently demonstrate at least 45 degrees of cervical 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    Time  3    Status  Achieved    Target Date  11/24/19      PT SHORT TERM GOAL #3   Title  Patient will report at least 25% improvement in overall symptoms to improve QOL.    Time  3    Period  Weeks    Status  On-going    Target Date  11/24/19        PT Long Term Goals - 11/20/19 0933      PT LONG TERM GOAL #1    Title  Patient will report at least 75% improvement in overall symptoms to improve QOL.    Time  6    Period  Weeks    Status  On-going      PT LONG TERM GOAL #2   Title  Patient will score with les then 40% limitation on FOTO to demonstrate improved functional ability.    Time  6    Period  Weeks    Status  On-going      PT LONG TERM GOAL #3   Title  Patient will demonstrate at least 20 degrees of cervical extension to improve ability to look up.    Time  6    Period  Weeks    Status  On-going            Plan - 12/01/19 1247    Clinical Impression Statement  Patient tolerated session well. Educated patient on benefits of traction and possible benefits from inflatable self-traction device. Patient verbalized understanding and very interested in getting a portable traction unit. Patient is improving in overall ROM and would continue to benefit from skilled physical therapy.    Personal Factors and Comorbidities  Comorbidity 1;Age    Comorbidities  HTN    Examination-Activity Limitations  Sleep;Other   driving and looking up   Examination-Participation Restrictions  Driving;Yard Work;Cleaning    Stability/Clinical Decision Making  Stable/Uncomplicated    Rehab Potential  Good    PT Frequency  2x / week    PT Duration  6 weeks    PT Treatment/Interventions  ADLs/Self Care Home Management;Biofeedback;Cryotherapy;Electrical Stimulation;Moist Heat;Traction;Therapeutic exercise;Therapeutic activities;Functional mobility training;Stair training;Gait training;Neuromuscular re-education;Patient/family education;Manual techniques;Dry needling;Passive range of motion;Joint Manipulations    PT Next Visit Plan  PN next session, f/u with traction. Continue posture strengthening exercises. Usecervical joint mobilizations, STM to cervical musculature for pain relief as needed.    PT Home Exercise Plan  self mobilization to SCM and UT; supine cervical rotation and rotation isometrics at 30  degrees, decompression exercises for improved posture and  sidelying LT cervical/upper thoracic rotation/extension exercise. 11/17/19 SNAG rotation; 2/1: Cervical and thoracic excursion    Consulted and Agree with Plan of Care  Patient       Patient will benefit from skilled therapeutic intervention in order to improve the following deficits and impairments:  Pain, Decreased mobility, Hypomobility  Visit Diagnosis: Cervicalgia     Problem List Patient Active Problem List   Diagnosis Date Noted  . Acute pancreatitis 04/25/2018  . Hypertension 04/25/2018  . Elevated lipase 04/25/2018  . Dehydration 04/25/2018  . Alcohol dependence, episodic drinking behavior ([place]) 04/25/2018  . Elevated d-dimer 04/25/2018  . Epigastric abdominal pain 04/25/2018  . Proteinuria 04/25/2018  . Leukocytosis 04/25/2018  . UTI (urinary tract infection) 04/25/2018  . Hyperlipidemia 04/25/2018  . Hyperglycemia 04/25/2018  . Abnormal transaminases 04/25/2018    12:48 PM, 12/01/19 Jerene Pitch, DPT Physical Therapy with [facility]  [phone] office  [place] [address] [place], [place], [address] Phone: [phone]   Fax:  [phone]  Name: Matthew Payne MRN: [id number] Date of Birth: [birth year]

## 2019-12-04 ENCOUNTER — Ambulatory Visit (HOSPITAL_COMMUNITY): Payer: Medicare Other | Admitting: Physical Therapy

## 2019-12-04 ENCOUNTER — Encounter (HOSPITAL_COMMUNITY): Payer: Self-pay | Admitting: Physical Therapy

## 2019-12-04 ENCOUNTER — Other Ambulatory Visit: Payer: Self-pay

## 2019-12-04 DIAGNOSIS — M542 Cervicalgia: Secondary | ICD-10-CM

## 2019-12-04 NOTE — Therapy (Signed)
[place] [address] [place], [place], [address] Phone: [phone]   Fax:  [phone]  Physical Therapy Treatment and Progress Note  Patient Details  Name: Matthew Payne MRN: [id number] Date of Birth: 23-Feb-1946 Referring Provider (PT): Koki Buxton  Progress Note Reporting Period 11/03/19 to 12/04/19  See note below for Objective Data and Assessment of Progress/Goals.       Encounter Date: 12/04/2019  PT End of Session - 12/04/19 0825    Visit Number  10    Number of Visits  12    Date for PT Re-Evaluation  12/15/19    Authorization Type  MCR    Authorization Time Period  POC Dates 11/03/19 to 12/15/2019    Authorization - Visit Number  10    Authorization - Number of Visits  10    PT Start Time  0826    PT Stop Time  0906    PT Time Calculation (min)  40 min    Activity Tolerance  Patient tolerated treatment well;No increased pain    Behavior During Therapy  WFL for tasks assessed/performed       Past Medical History:  Diagnosis Date  . Hypertension     Past Surgical History:  Procedure Laterality Date  . CATARACT EXTRACTION, BILATERAL  5 yrs ago  . COLONOSCOPY  05/28/2011   Procedure: COLONOSCOPY;  Surgeon: [name], MD;  [facility];  Service: Endoscopy;  Laterality: N/A;  . CYSTECTOMY     back & leg  . INGUINAL HERNIA REPAIR  07/01/2012   Procedure: HERNIA REPAIR INGUINAL ADULT;  Surgeon: [name], MD;  Location: [facility];  Service: General;  Laterality: Right;  Right Inguinal Herniorraphy with Mesh  . Nesbit  Plication    . TONSILLECTOMY      There were no vitals filed for this visit.  Subjective Assessment - 12/04/19 0825    Subjective  States he is tired, he has not had power since saturday morning. States he still has pain in his neck, but has improved movement. He has not gotten the traction device.    Pertinent History  HTN, chronicity of condition    How long can you sit  comfortably?  no problems    How long can you stand comfortably?  no problems    How long can you walk comfortably?  no problems    Diagnostic tests  MRI    Patient Stated Goals  to be able to turn to back in trailer    Currently in Pain?  Yes    Pain Score  3     Pain Location  Neck    Pain Orientation  Left    Pain Onset  More than a month ago    Aggravating Factors   with neck rotation         [facility] PT Assessment - 12/04/19 0001      Assessment   Medical Diagnosis  cervicalgia    Referring Provider (PT)  Duffy Rhody      Observation/Other Assessments   Focus on Therapeutic Outcomes (FOTO)   31% limited   was 47% limited     AROM   Cervical Flexion  45   was 55   Cervical Extension  30   was 10, pain in the back of the head   Cervical - Right Rotation  46 degrees   pain on left side, was 27 degrees   Cervical - Left  Rotation  45 degrees   was 30, pain on left side                  [facility] Adult PT Treatment/Exercise - 12/04/19 0001      Manual Therapy   Manual Therapy  Joint mobilization;Soft tissue mobilization;Manual Traction    Manual therapy comments  all manual interventions performed independently of other interventions    Joint Mobilization  AP and medial glides to left cervical spine PA to right cervical spine grade III with and without cervical ROT - tolerated well focused on C3-6    Soft tissue mobilization  STM to cervical paraspinals,  SCM, anterior scalenes and UT    Manual Traction  cervical traction - manual with and without ROT -tolerated well              PT Education - 12/04/19 1128    Education Details  educated patient on self mobilization to cervical spine, cervical spine anatomy. Progressed made while in therapy, and anticipated progress to be made.    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    Time  3    Status  Achieved    Target Date  11/24/19      PT SHORT TERM GOAL #3   Title  Patient will report at least 25% improvement in overall symptoms to improve QOL.    Baseline  2/15 30% better    Time  3    Period  Weeks    Status  Achieved    Target Date  11/24/19        PT Long Term Goals - 12/04/19 0835      PT LONG TERM GOAL #1   Title  Patient will report at least 75% improvement in overall symptoms to improve QOL.    Baseline  2/15 30% better    Time  6    Period  Weeks    Status  On-going      PT LONG TERM GOAL #2   Title  Patient will score with les then 40% limitation on FOTO to demonstrate improved functional ability.    Baseline  2/15 31% limitation    Time  6    Period  Weeks    Status  Achieved      PT LONG TERM GOAL #3   Title  Patient will demonstrate at least 20 degrees of cervical extension to improve ability to look up.    Baseline  2/15 30 degrees    Time  6    Period  Weeks    Status  Achieved            Plan - 12/04/19 1132    Clinical Impression Statement  Patient present for a progress note on this date. He is making great progress and has met all short term goals  and 2 out of 3 long term goals at this time. Patient continues to have pain with end range cervical ROM, but his available ROM has improved significantly. Discussed benefits of daily traction, and patient very interested in obtaining home traction unit. Will follow up with patient getting inflatable unit next session. Patient would continue to benefit from skilled physical therapy to improve functional mobility and quality of life.    Personal Factors and  Comorbidities  Comorbidity 1;Age    Comorbidities  HTN    Examination-Activity Limitations  Sleep;Other   driving and looking up   Examination-Participation Restrictions  Driving;Yard Work;Cleaning    Stability/Clinical Decision Making  Stable/Uncomplicated    Rehab Potential  Good    PT Frequency  2x / week    PT Duration  6 weeks    PT Treatment/Interventions  ADLs/Self Care Home Management;Biofeedback;Cryotherapy;Electrical Stimulation;Moist Heat;Traction;Therapeutic exercise;Therapeutic activities;Functional mobility training;Stair training;Gait training;Neuromuscular re-education;Patient/family education;Manual techniques;Dry needling;Passive range of motion;Joint Manipulations    PT Next Visit Plan  f/u with traction unit. Continue posture strengthening exercises. cervical joint mobilizations, STM to cervical musculature for pain relief as needed. - t spine    PT Home Exercise Plan  self mobilization to SCM and UT; supine cervical rotation and rotation isometrics at 30 degrees, decompression exercises for improved posture and sidelying LT cervical/upper thoracic rotation/extension exercise. 11/17/19 SNAG rotation; 2/1: Cervical and thoracic excursion    Consulted and Agree with Plan of Care  Patient       Patient will benefit from skilled therapeutic intervention in order to improve the following deficits and impairments:  Pain, Decreased mobility, Hypomobility  Visit Diagnosis: Cervicalgia     Problem List Patient Active Problem List   Diagnosis Date Noted  . Acute pancreatitis 04/25/2018  . Hypertension 04/25/2018  . Elevated lipase 04/25/2018  . Dehydration 04/25/2018  . Alcohol dependence, episodic drinking behavior ([place]) 04/25/2018  . Elevated d-dimer 04/25/2018  . Epigastric abdominal pain 04/25/2018  . Proteinuria 04/25/2018  . Leukocytosis 04/25/2018  . UTI (urinary tract infection) 04/25/2018  . Hyperlipidemia 04/25/2018  . Hyperglycemia 04/25/2018  . Abnormal  transaminases 04/25/2018   11:33 AM, 12/04/19 Jerene Pitch, DPT Physical Therapy with [facility]  [phone] office  [place] [address] [place], [place], [address] Phone: [phone]   Fax:  [phone]  Name: CAESON FILIPPI MRN: [id number] Date of Birth: 1946/05/18

## 2019-12-08 ENCOUNTER — Other Ambulatory Visit: Payer: Self-pay

## 2019-12-08 ENCOUNTER — Ambulatory Visit (HOSPITAL_COMMUNITY): Payer: Medicare Other | Admitting: Physical Therapy

## 2019-12-08 ENCOUNTER — Encounter (HOSPITAL_COMMUNITY): Payer: Self-pay | Admitting: Physical Therapy

## 2019-12-08 DIAGNOSIS — M542 Cervicalgia: Secondary | ICD-10-CM

## 2019-12-08 NOTE — Therapy (Signed)
[place], [place], [address] Phone: [phone]   Fax:  [phone]  Physical Therapy Treatment  Patient Details  Name: Matthew Payne MRN: [id number] Date of Birth: 1946/03/04 Referring Provider (PT): Duffy Rhody   Encounter Date: 12/08/2019  PT End of Session - 12/08/19 0902    Visit Number  11    Number of Visits  12    Date for PT Re-Evaluation  12/15/19    Authorization Type  MCR    Authorization Time Period  POC Dates 11/03/19 to 12/15/2019    Authorization - Visit Number  1    Authorization - Number of Visits  10    PT Start Time  0908    PT Stop Time  0948    PT Time Calculation (min)  40 min    Activity Tolerance  Patient tolerated treatment well;No increased pain    Behavior During Therapy  WFL for tasks assessed/performed       Past Medical History:  Diagnosis Date  . Hypertension     Past Surgical History:  Procedure Laterality Date  . CATARACT EXTRACTION, BILATERAL  5 yrs ago  . COLONOSCOPY  05/28/2011   Procedure: COLONOSCOPY;  Surgeon: [name], MD;  [facility];  Service: Endoscopy;  Laterality: N/A;  . CYSTECTOMY     back & leg  . INGUINAL HERNIA REPAIR  07/01/2012   Procedure: HERNIA REPAIR INGUINAL ADULT;  Surgeon: [name], MD;  Location: [facility];  Service: General;  Laterality: Right;  Right Inguinal Herniorraphy with Mesh  . Nesbit  Plication    . TONSILLECTOMY      There were no vitals filed for this visit.  Subjective Assessment - 12/08/19 0911    Subjective  Got neck stretcher (traction device) States it stretches his neck but not if he is overflating it. Contineues to have improved motion but pain with pain.    Pertinent History  HTN, chronicity of condition    How long can you sit comfortably?  no problems    How long can you stand comfortably?  no problems    How long can you walk comfortably?  no problems    Diagnostic tests  MRI    Patient  Stated Goals  to be able to turn to back in trailer    Currently in Pain?  Yes    Pain Score  2     Pain Location  Neck    Pain Orientation  Left    Pain Onset  More than a month ago    Aggravating Factors   with neck rotation.         [facility] PT Assessment - 12/08/19 0001      Assessment   Medical Diagnosis  cervicalgia    Referring Provider (PT)  Duffy Rhody                   [facility] Adult PT Treatment/Exercise - 12/08/19 0001      Manual Therapy   Manual Therapy  Joint mobilization;Soft tissue mobilization;Manual Traction    Manual therapy comments  all manual interventions performed independently of other interventions    Joint Mobilization  L shoulder mobilization in right side lying  to encourage downward rotation and retraction - tolerated moderately well , with and without shoulder ROM    Soft tissue mobilization  STM/TPR to L UT, levator, rhomboids and pec minor    Manual Traction  cervical traction - manual with and without ROT -tolerated well              PT Education - 12/08/19 1012    Education Details  on HEP, reasoning behind exercises and how to use traction device at home.    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    Time  3    Status  Achieved    Target Date  11/24/19      PT SHORT TERM GOAL #3   Title  Patient will report at least 25% improvement in overall symptoms to improve QOL.    Baseline  2/15 30% better    Time  3    Period  Weeks    Status  Achieved    Target Date  11/24/19         PT Long Term Goals - 12/04/19 0835      PT LONG TERM GOAL #1   Title  Patient will report at least 75% improvement in overall symptoms to improve QOL.    Baseline  2/15 30% better    Time  6    Period  Weeks    Status  On-going      PT LONG TERM GOAL #2   Title  Patient will score with les then 40% limitation on FOTO to demonstrate improved functional ability.    Baseline  2/15 31% limitation    Time  6    Period  Weeks    Status  Achieved      PT LONG TERM GOAL #3   Title  Patient will demonstrate at least 20 degrees of cervical extension to improve ability to look up.    Baseline  2/15 30 degrees    Time  6    Period  Weeks    Status  Achieved            Plan - 12/08/19 1013    Clinical Impression Statement  Patient tolerated session well. Most benefit noted with traction. Educated patient on how to use traction device appropriately at home. Answered all questions about his traction device. Will follow up with patient's response to traction unit at home with using it a few times a day. Less pain noted end of session.    Personal Factors and Comorbidities  Comorbidity 1;Age    Comorbidities  HTN    Examination-Activity Limitations  Sleep;Other   driving and looking up   Examination-Participation Restrictions  Driving;Yard Work;Cleaning    Stability/Clinical Decision Making  Stable/Uncomplicated    Rehab Potential  Good    PT Frequency  2x / week    PT Duration  6 weeks    PT Treatment/Interventions  ADLs/Self Care Home Management;Biofeedback;Cryotherapy;Electrical Stimulation;Moist Heat;Traction;Therapeutic exercise;Therapeutic activities;Functional mobility training;Stair training;Gait training;Neuromuscular re-education;Patient/family education;Manual techniques;Dry needling;Passive range of motion;Joint Manipulations    PT Next Visit Plan  f/u with traction unit. Continue posture strengthening exercises. cervical joint mobilizations, STM to cervical  musculature for pain relief as needed. - t spine    PT Home Exercise Plan  self mobilization to SCM and UT; supine cervical rotation and rotation isometrics at 30 degrees, decompression exercises for improved posture and sidelying LT cervical/upper thoracic rotation/extension exercise. 11/17/19 SNAG rotation; 2/1: Cervical and thoracic excursion    Consulted and Agree with Plan of Care  Patient       Patient will benefit from skilled therapeutic intervention in order to improve the following deficits and impairments:  Pain, Decreased mobility, Hypomobility  Visit Diagnosis: Cervicalgia     Problem List Patient Active Problem List   Diagnosis Date Noted  . Acute pancreatitis 04/25/2018  . Hypertension 04/25/2018  . Elevated lipase 04/25/2018  . Dehydration 04/25/2018  . Alcohol dependence, episodic drinking behavior ([place]) 04/25/2018  . Elevated d-dimer 04/25/2018  . Epigastric abdominal pain 04/25/2018  . Proteinuria 04/25/2018  . Leukocytosis 04/25/2018  . UTI (urinary tract infection) 04/25/2018  . Hyperlipidemia 04/25/2018  . Hyperglycemia 04/25/2018  . Abnormal transaminases 04/25/2018    10:15 AM, 12/08/19 Jerene Pitch, DPT Physical Therapy with [facility]  [phone] office  [place] [address] [place], [place], [address] Phone: [phone]   Fax:  [phone]  Name: MADHAV LEVENS MRN: [id number] Date of Birth: 11/25/1945

## 2019-12-14 ENCOUNTER — Encounter (HOSPITAL_COMMUNITY): Payer: Self-pay | Admitting: Physical Therapy

## 2019-12-14 ENCOUNTER — Ambulatory Visit (HOSPITAL_COMMUNITY): Payer: Medicare Other | Admitting: Physical Therapy

## 2019-12-14 ENCOUNTER — Other Ambulatory Visit: Payer: Self-pay

## 2019-12-14 DIAGNOSIS — M542 Cervicalgia: Secondary | ICD-10-CM

## 2019-12-14 NOTE — Therapy (Signed)
[facility] [address] [place], [place], [address] Phone: [phone]   Fax:  [phone]  Physical Therapy Treatment and Discharge Note  Patient Details  Name: Matthew Payne MRN: [id number] Date of Birth: 1946/09/15 Referring Provider (PT): Duffy Rhody  PHYSICAL THERAPY DISCHARGE SUMMARY  Visits from Start of Care: 12  Current functional level related to goals / functional outcomes: See below  Remaining deficits: continued pain   Education / Equipment: See below Plan: Patient agrees to discharge.  Patient goals were partially met. Patient is being discharged due to being pleased with the current functional level.  ?????       Encounter Date: 12/14/2019  PT End of Session - 12/14/19 0830    Visit Number  12    Number of Visits  12    Date for PT Re-Evaluation  12/15/19    Authorization Type  MCR    Authorization Time Period  POC Dates 11/03/19 to 12/15/2019    Authorization - Visit Number  2    Authorization - Number of Visits  10    PT Start Time  0830    PT Stop Time  0910    PT Time Calculation (min)  40 min    Activity Tolerance  Patient tolerated treatment well;No increased pain    Behavior During Therapy  WFL for tasks assessed/performed       Past Medical History:  Diagnosis Date  . Hypertension     Past Surgical History:  Procedure Laterality Date  . CATARACT EXTRACTION, BILATERAL  5 yrs ago  . COLONOSCOPY  05/28/2011   Procedure: COLONOSCOPY;  Surgeon: [name], MD;  Location: [facility];  Service: Endoscopy;  Laterality: N/A;  . CYSTECTOMY     back & leg  . INGUINAL HERNIA REPAIR  07/01/2012   Procedure: HERNIA REPAIR INGUINAL ADULT;  Surgeon: [name], MD;  Location: [facility];  Service: General;  Laterality: Right;  Right Inguinal Herniorraphy with Mesh  . Nesbit  Plication    . TONSILLECTOMY      There were no vitals filed for this visit.  Subjective Assessment - 12/14/19 1523     Subjective  States that overall he feels about 75% better since the start of PT. States that he still has pain on the left side of his neck with motion but he no longer has pain on the right and his motion is better. States he is doing his exercises but has not found that cream for his neck pain yet.    Pertinent History  HTN, chronicity of condition    How long can you sit comfortably?  no problems    How long can you stand comfortably?  no problems    How long can you walk comfortably?  no problems    Diagnostic tests  MRI    Patient Stated Goals  to be able to turn to back in trailer    Currently in Pain?  Yes    Pain Score  1     Pain Location  Neck    Pain Orientation  Left    Pain Descriptors / Indicators  Aching    Pain Onset  More than a month ago         [facility] PT Assessment - 12/14/19 0001      Assessment   Medical Diagnosis  cervicalgia    Referring Provider (PT)  Duffy Rhody      AROM  Cervical Flexion  45    Cervical Extension  30    Cervical - Right Rotation  60 degrees    Cervical - Left Rotation  60 degrees      Standardized Balance Assessment   Standardized Balance Assessment  Dynamic Gait Index      Dynamic Gait Index   Level Surface  --    Change in Gait Speed  --    Gait with Horizontal Head Turns  --    Gait with Vertical Head Turns  --    Gait and Pivot Turn  --    Step Over Obstacle  --    Step Around Obstacles  --    Steps  --    Total Score  --                   [facility] Adult PT Treatment/Exercise - 12/14/19 0001      Manual Therapy   Manual Therapy  Joint mobilization;Soft tissue mobilization;Manual Traction    Manual therapy comments  all manual interventions performed independently of other interventions    Joint Mobilization  left medial glides C3-5 grade III    Soft tissue mobilization  STM to left SCM, anterior scalenes and UT    Manual Traction  cervical traction - manual with and without ROT -tolerated well               PT Education - 12/14/19 1518    Education Details  Educated patient in use of topical creams (icy hot, hemp cream, tiger balm). Current functional status with improved cervical ROM, discussed continued HEP with focus on mobility and self mobilization. Educated patient on use of lists, writing down thoughts and deep breathing prior to bed to help promote deeper sleep.    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    Time  3    Status  Achieved    Target Date  11/24/19      PT SHORT TERM GOAL #3   Title  Patient will report at least 25% improvement in overall symptoms to improve QOL.    Baseline  2/15 30% better    Time  3    Period  Weeks    Status  Achieved    Target Date  11/24/19        PT Long Term Goals - 12/14/19 1520      PT LONG TERM GOAL #1   Title  Patient will report at least 75% improvement in overall symptoms to improve QOL.    Baseline  2/25 75% better    Time  6    Period  Weeks    Status  Achieved      PT LONG TERM GOAL #2   Title  Patient will score with les then 40% limitation on FOTO to demonstrate improved functional ability.  Baseline  2/15 31% limitation    Time  6    Period  Weeks    Status  Achieved      PT LONG TERM GOAL #3   Title  Patient will demonstrate at least 20 degrees of cervical extension to improve ability to look up.    Baseline  2/15 30 degrees    Time  6    Period  Weeks    Status  Achieved            Plan - 12/14/19 1520    Clinical Impression Statement   Patient has met all short and long term goals at this time. Continued pain noted with cervical rotation but pain is no longer bilateral and feels confident in home program with continued reduction in pain symptoms. Answered all questions prior to end of session. Patient to discharge from PT to HEP at this time.    Personal Factors and Comorbidities  Comorbidity 1;Age    Comorbidities  HTN    Examination-Activity Limitations  Sleep;Other   driving and looking up   Examination-Participation Restrictions  Driving;Yard Work;Cleaning    Stability/Clinical Decision Making  Stable/Uncomplicated    Rehab Potential  Good    PT Frequency  2x / week    PT Duration  6 weeks    PT Treatment/Interventions  ADLs/Self Care Home Management;Biofeedback;Cryotherapy;Electrical Stimulation;Moist Heat;Traction;Therapeutic exercise;Therapeutic activities;Functional mobility training;Stair training;Gait training;Neuromuscular re-education;Patient/family education;Manual techniques;Dry needling;Passive range of motion;Joint Manipulations    PT Next Visit Plan  pt to DC from PT to HEP at this time    PT Home Exercise Plan  self mobilization to SCM and UT; supine cervical rotation and rotation isometrics at 30 degrees, decompression exercises for improved posture and sidelying LT cervical/upper thoracic rotation/extension exercise. 11/17/19 SNAG rotation; 2/1: Cervical and thoracic excursion    Consulted and Agree with Plan of Care  Patient       Patient will benefit from skilled therapeutic intervention in order to improve the following deficits and impairments:  Pain, Decreased mobility, Hypomobility  Visit Diagnosis: Cervicalgia     Problem List Patient Active Problem List   Diagnosis Date Noted  . Acute pancreatitis 04/25/2018  . Hypertension 04/25/2018  . Elevated lipase 04/25/2018  . Dehydration 04/25/2018  . Alcohol dependence, episodic drinking behavior ([place]) 04/25/2018  . Elevated d-dimer 04/25/2018   . Epigastric abdominal pain 04/25/2018  . Proteinuria 04/25/2018  . Leukocytosis 04/25/2018  . UTI (urinary tract infection) 04/25/2018  . Hyperlipidemia 04/25/2018  . Hyperglycemia 04/25/2018  . Abnormal transaminases 04/25/2018    3:27 PM, 12/14/19 Jerene Pitch, DPT Physical Therapy with [facility]  [phone] office   [facility] [address] [place], [place], [address] Phone: [phone]   Fax:  [phone]  Name: ZIAN DELAIR MRN: [id number] Date of Birth: 04-20-1946

## 2020-01-12 ENCOUNTER — Ambulatory Visit (INDEPENDENT_AMBULATORY_CARE_PROVIDER_SITE_OTHER): Payer: Medicare Other | Admitting: Urology

## 2020-01-12 ENCOUNTER — Other Ambulatory Visit: Payer: Self-pay

## 2020-01-12 VITALS — BP 123/58 | HR 73 | Temp 97.7°F | Ht 72.0 in | Wt 178.0 lb

## 2020-01-12 DIAGNOSIS — N390 Urinary tract infection, site not specified: Secondary | ICD-10-CM

## 2020-01-12 LAB — POCT URINALYSIS DIPSTICK
Bilirubin, UA: NEGATIVE
Blood, UA: NEGATIVE
Glucose, UA: NEGATIVE
Ketones, UA: NEGATIVE
Leukocytes, UA: NEGATIVE
Nitrite, UA: NEGATIVE
Protein, UA: POSITIVE — AB
Spec Grav, UA: 1.02 (ref 1.010–1.025)
Urobilinogen, UA: 0.2 E.U./dL
pH, UA: 6 (ref 5.0–8.0)

## 2020-01-12 LAB — BLADDER SCAN AMB NON-IMAGING: Scan Result: 40.3

## 2020-01-12 NOTE — Progress Notes (Signed)
Subjective:  1. Urinary tract infection without hematuria, site unspecified      Matthew Payne returns today in f/u for a flow rate and PVR and was given tamsulosin in addition to the tadalafil. His last PSA was  down to 3.6 on 10/20. He is remains on tadalafil daily for ED and LUTS but is not sexually active. He some frequency and urgency and has a reduced stream. His IPSS is 4 which is improved. He has increased issues if he has more than one cup of coffee daily. He had an MRIP in 5/20 that showed an 51ml prostate with no suspicious lesions. He does have a middle lobe that is primarily on the right on MRI.   His PVR is 37ml but he didn't have enough volume for the flowmeter to register.   GU Hx: Matthew Payne is a 74 yo WM who is sent by Dr. Hilma Favors for an elevated PSA of 4.1. His PSA has been rising steadily for the last few years and was 1.58 in 2011. He had a biopsy for a nodule about 15 years ago. He has moderate LUTS with an IPSS of 11. He is currently on tadalafil which was started about 2 months ago and has helped his symptoms. He has had no hematuria or dysuria. He had a foley in 7/19 when hospitalized and [facility] with acute pancreatitis possibly from alcohol. He had mild pyuria but a negative culture. He stopped drinking after that. He has a history of Peyronie's disease and had a phalloplasty in 2008 by Dr. Karsten Ro.    IPSS    Row Name 01/12/20 0900         International Prostate Symptom Score   How often have you had the sensation of not emptying your bladder?  Less than 1 in 5     How often have you had to urinate less than every two hours?  Less than 1 in 5 times     How often have you found you stopped and started again several times when you urinated?  Less than 1 in 5 times     How often have you found it difficult to postpone urination?  Not at All     How often have you had a weak urinary stream?  Not at All     How often have you had to strain to start urination?  Not at All     How  many times did you typically get up at night to urinate?  1 Time     Total IPSS Score  4       Quality of Life due to urinary symptoms   If you were to spend the rest of your life with your urinary condition just the way it is now how would you feel about that?  Mostly Satisfied         ROS:  ROS:  A complete review of systems was performed.  All systems are negative except for pertinent findings as noted.   Review of Systems  Musculoskeletal: Positive for back pain and joint pain.  Endo/Heme/Allergies: Bruises/bleeds easily.       Night sweats    No Known Allergies  Outpatient Encounter Medications as of 01/12/2020  Medication Sig  . enalapril (VASOTEC) 10 MG tablet Take 10 mg by mouth daily.  . folic acid (FOLVITE) 1 MG tablet Take 1 tablet (1 mg total) by mouth daily.  Marland Kitchen linaclotide (LINZESS) 145 MCG CAPS capsule Take 1 capsule (145 mcg total) by  mouth daily before breakfast.  . Multiple Vitamins-Minerals (EMERGEN-C VITAMIN C) CHEW Chew 1 tablet by mouth daily.  . pravastatin (PRAVACHOL) 40 MG tablet   . tamsulosin (FLOMAX) 0.4 MG CAPS capsule   . thiamine 100 MG tablet Take 1 tablet (100 mg total) by mouth daily.  Marland Kitchen zolpidem (AMBIEN) 10 MG tablet    No facility-administered encounter medications on file as of 01/12/2020.    Past Medical History:  Diagnosis Date  . Hypertension     Past Surgical History:  Procedure Laterality Date  . CATARACT EXTRACTION, BILATERAL  5 yrs ago  . COLONOSCOPY  05/28/2011   Procedure: COLONOSCOPY;  Surgeon: [name], MD;  Location: [facility];  Service: Endoscopy;  Laterality: N/A;  . CYSTECTOMY     back & leg  . INGUINAL HERNIA REPAIR  07/01/2012   Procedure: HERNIA REPAIR INGUINAL ADULT;  Surgeon: [name], MD;  Location: [facility];  Service: General;  Laterality: Right;  Right Inguinal Herniorraphy with Mesh  . Nesbit  Plication    . TONSILLECTOMY      Social History   Socioeconomic History  . Marital status: Married     Spouse name: Not on file  . Number of children: Not on file  . Years of education: Not on file  . Highest education level: Not on file  Occupational History  . Not on file  Tobacco Use  . Smoking status: Former Smoker    Quit date: 02/24/1978    Years since quitting: 41.9  . Smokeless tobacco: Never Used  Substance and Sexual Activity  . Alcohol use: Yes  . Drug use: No  . Sexual activity: Yes  Other Topics Concern  . Not on file  Social History Narrative  . Not on file   Social Determinants of Health   Financial Resource Strain:   . Difficulty of Paying Living Expenses:   Food Insecurity:   . Worried About Charity fundraiser in the Last Year:   . Arboriculturist in the Last Year:   Transportation Needs:   . Film/video editor (Medical):   Marland Kitchen Lack of Transportation (Non-Medical):   Physical Activity:   . Days of Exercise per Week:   . Minutes of Exercise per Session:   Stress:   . Feeling of Stress :   Social Connections:   . Frequency of Communication with Friends and Family:   . Frequency of Social Gatherings with Friends and Family:   . Attends Religious Services:   . Active Member of Clubs or Organizations:   . Attends Archivist Meetings:   Marland Kitchen Marital Status:   Intimate Partner Violence:   . Fear of Current or Ex-Partner:   . Emotionally Abused:   Marland Kitchen Physically Abused:   . Sexually Abused:     No family history on file.     Objective: Vitals:   01/12/20 0939  BP: (!) 123/58  Pulse: 73  Temp: 97.7 F (36.5 C)     Physical Exam  Lab Results:  Results for orders placed or performed in visit on 01/12/20 (from the past 24 hour(s))  POCT urinalysis dipstick     Status: Abnormal   Collection Time: 01/12/20  9:46 AM  Result Value Ref Range   Color, UA yellow    Clarity, UA     Glucose, UA Negative Negative   Bilirubin, UA neg    Ketones, UA neg    Spec Grav, UA 1.020 1.010 -  1.025   Blood, UA neg    pH, UA 6.0 5.0 - 8.0    Protein, UA Positive (A) Negative   Urobilinogen, UA 0.2 0.2 or 1.0 E.U./dL   Nitrite, UA neg    Leukocytes, UA Negative Negative   Appearance clear    Odor      BMET No results for input(s): NA, K, CL, CO2, GLUCOSE, BUN, CREATININE, CALCIUM in the last 72 hours. PSA No results found for: PSA No results found for: TESTOSTERONE    Studies/Results:  PVR 16ml.    Assessment & Plan: BPH with BOO with improvement on tamsulosin with the tadalafil.   He will continue that med.  Elevated PSA.  He is getting that done by Dr. Hilma Favors and we will request the results.  ED for which he takes the tadalafil. But he is not active.   No orders of the defined types were placed in this encounter.    Orders Placed This Encounter  Procedures  . POCT urinalysis dipstick  . BLADDER SCAN AMB NON-IMAGING      Return in about 6 months (around 07/14/2020) for for f/u of BPH.   CC: Sharilyn Sites, MD      Irine Seal 01/12/2020

## 2020-01-22 ENCOUNTER — Ambulatory Visit (HOSPITAL_COMMUNITY)
Admission: RE | Admit: 2020-01-22 | Discharge: 2020-01-22 | Disposition: A | Discharge status: Home / Self Care | Payer: Medicare Other | Source: Ambulatory Visit | Attending: Neurosurgery | Admitting: Neurosurgery

## 2020-01-22 ENCOUNTER — Other Ambulatory Visit: Payer: Self-pay

## 2020-01-22 DIAGNOSIS — M542 Cervicalgia: Secondary | ICD-10-CM | POA: Insufficient documentation

## 2020-02-05 DIAGNOSIS — M542 Cervicalgia: Secondary | ICD-10-CM | POA: Diagnosis not present

## 2020-03-29 DIAGNOSIS — Z125 Encounter for screening for malignant neoplasm of prostate: Secondary | ICD-10-CM | POA: Diagnosis not present

## 2020-03-29 DIAGNOSIS — Z1389 Encounter for screening for other disorder: Secondary | ICD-10-CM | POA: Diagnosis not present

## 2020-03-29 DIAGNOSIS — R972 Elevated prostate specific antigen [PSA]: Secondary | ICD-10-CM | POA: Diagnosis not present

## 2020-03-29 DIAGNOSIS — R7309 Other abnormal glucose: Secondary | ICD-10-CM | POA: Diagnosis not present

## 2020-03-29 DIAGNOSIS — Z6825 Body mass index (BMI) 25.0-25.9, adult: Secondary | ICD-10-CM | POA: Diagnosis not present

## 2020-03-29 DIAGNOSIS — Z0000 Encounter for general adult medical examination without abnormal findings: Secondary | ICD-10-CM | POA: Diagnosis not present

## 2020-03-29 DIAGNOSIS — I10 Essential (primary) hypertension: Secondary | ICD-10-CM | POA: Diagnosis not present

## 2020-03-29 DIAGNOSIS — E663 Overweight: Secondary | ICD-10-CM | POA: Diagnosis not present

## 2020-05-10 DIAGNOSIS — M542 Cervicalgia: Secondary | ICD-10-CM | POA: Diagnosis not present

## 2020-05-10 DIAGNOSIS — Z6825 Body mass index (BMI) 25.0-25.9, adult: Secondary | ICD-10-CM | POA: Diagnosis not present

## 2020-05-10 DIAGNOSIS — I10 Essential (primary) hypertension: Secondary | ICD-10-CM | POA: Diagnosis not present

## 2020-07-05 ENCOUNTER — Ambulatory Visit: Payer: Medicare Other | Admitting: Urology

## 2020-07-12 ENCOUNTER — Ambulatory Visit: Payer: Medicare Other | Admitting: Urology

## 2020-08-10 IMAGING — CT CT CERVICAL SPINE W/O CM
3 of 4 series · 13 of 33 positions shown, 16 images · non-contrast
Comparison: MRI of the cervical spine dated 10/16/2019

CLINICAL DATA: Chronic neck pain. Limited range of motion.

EXAM:
CT CERVICAL SPINE WITHOUT CONTRAST
TECHNIQUE: Multidetector CT imaging of the cervical spine was performed without
intravenous contrast. Multiplanar CT image reconstructions were also
generated.

[Series 5: sag bone · sagittal · 0.31mm/px · 5 of 102 slices shown, 6 images]
[im 34/102  bone]
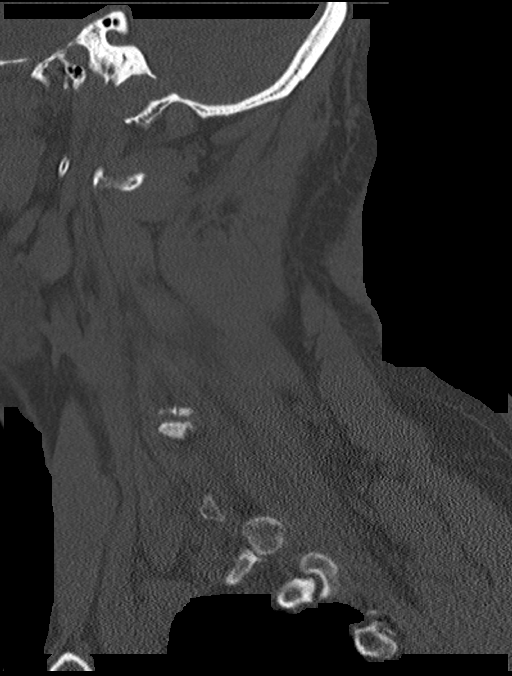
[im 43/102  bone]
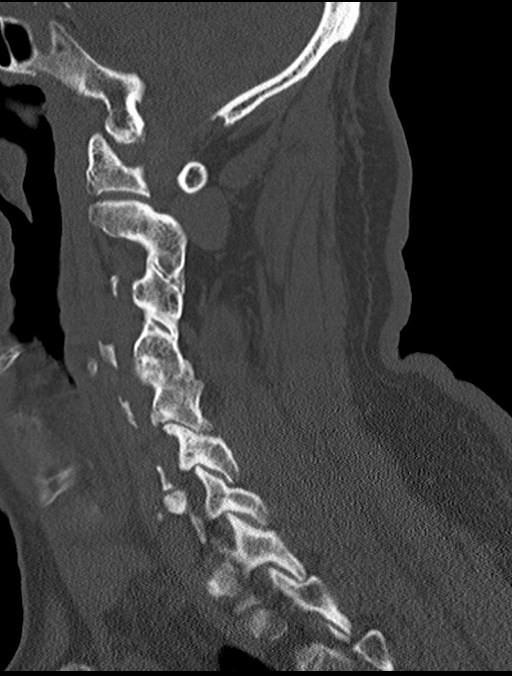
[im 51/102  soft-tissue]
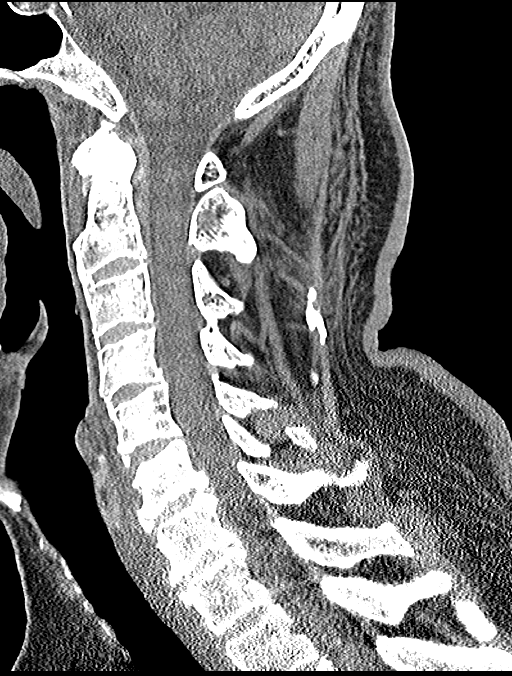
[im 51/102  bone]
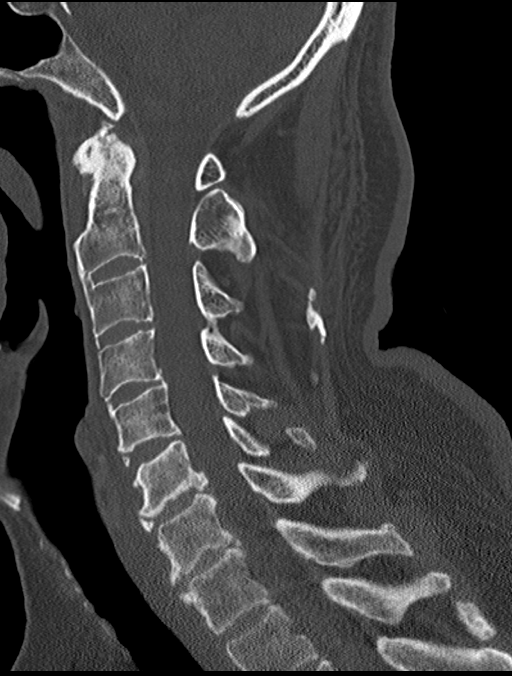
[im 59/102  bone]
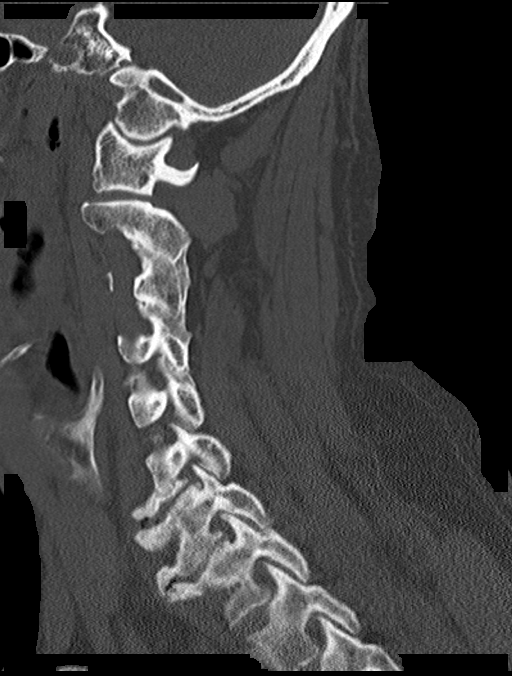
[im 68/102  bone]
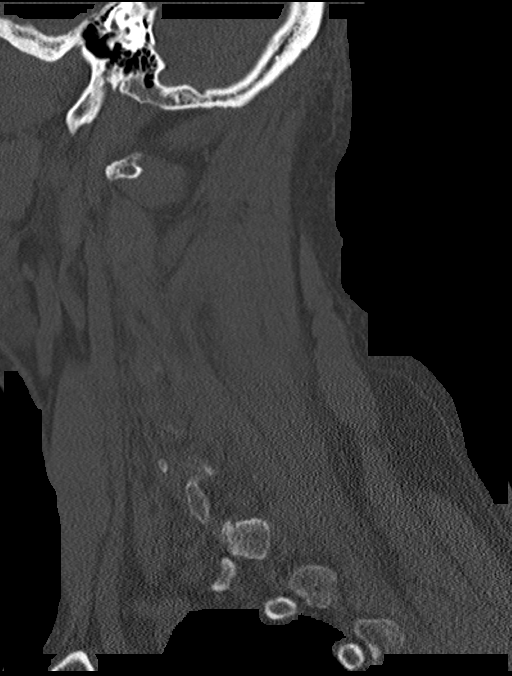

[Series 6: cor bone · coronal · 0.39mm/px · 3 of 78 slices shown]
[im 17/78  bone]
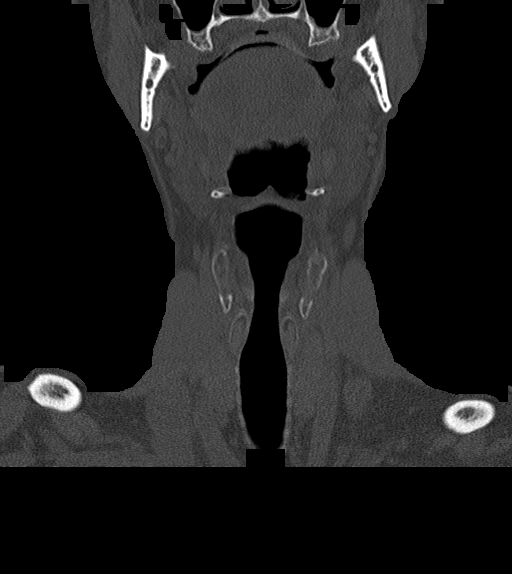
[im 32/78  bone]
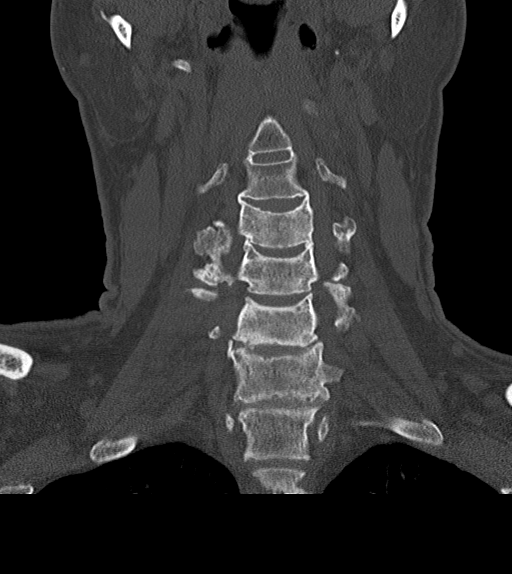
[im 47/78  bone]
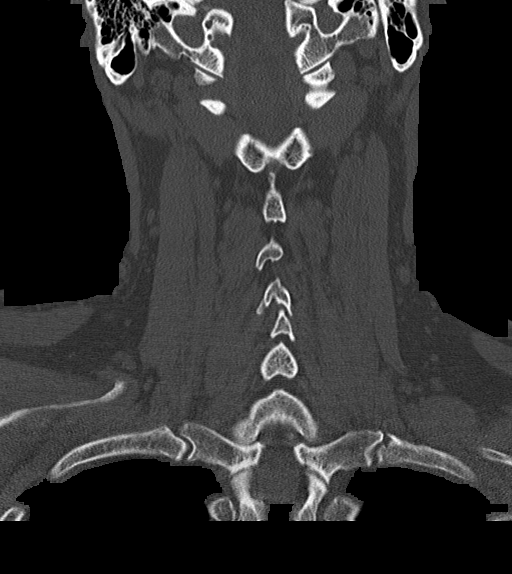

[Series 7: orthogonal axials · axial · 0.21mm/px · z∈[+1244,+1382]mm · 5 of 99 slices shown, 7 images]
[im 17/99  soft-tissue]
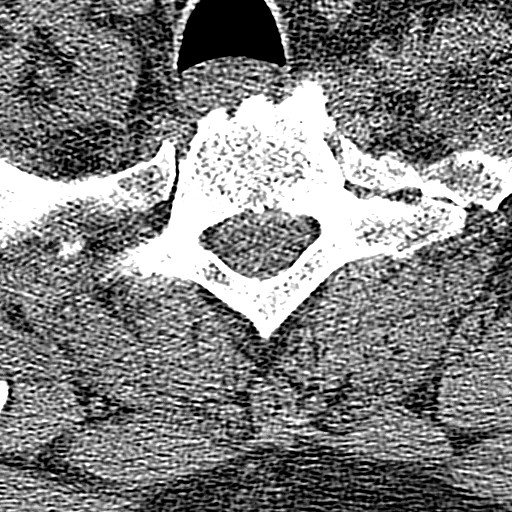
[im 17/99  bone]
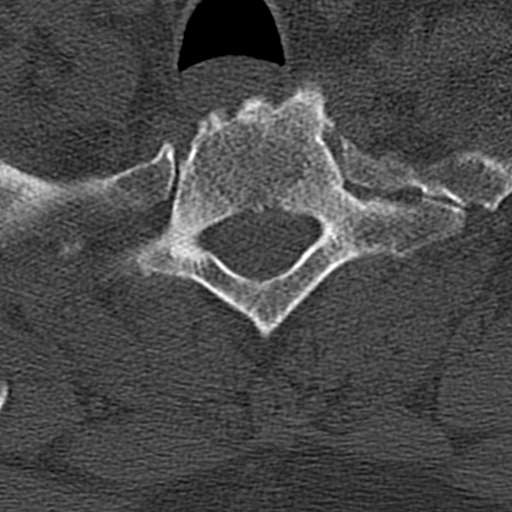
[im 33/99  bone]
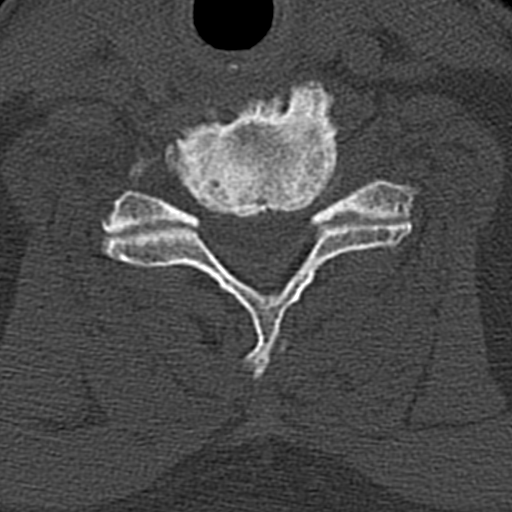
[im 50/99  bone]
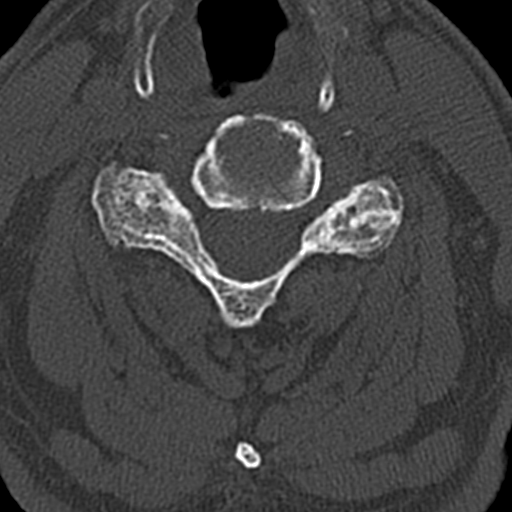
[im 66/99  bone]
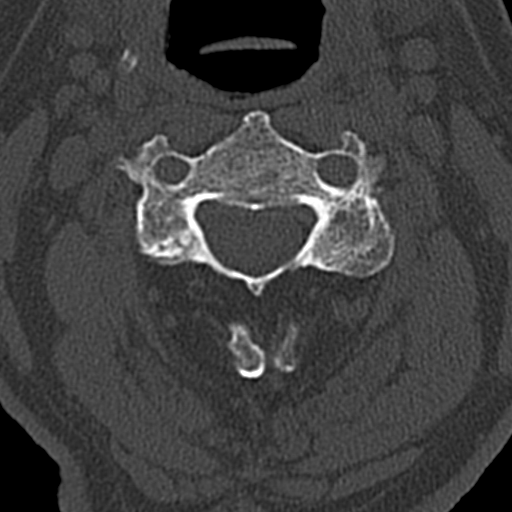
[im 82/99  soft-tissue]
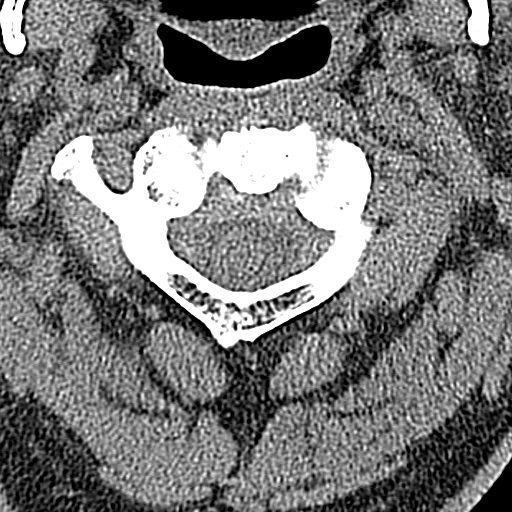
[im 82/99  bone]
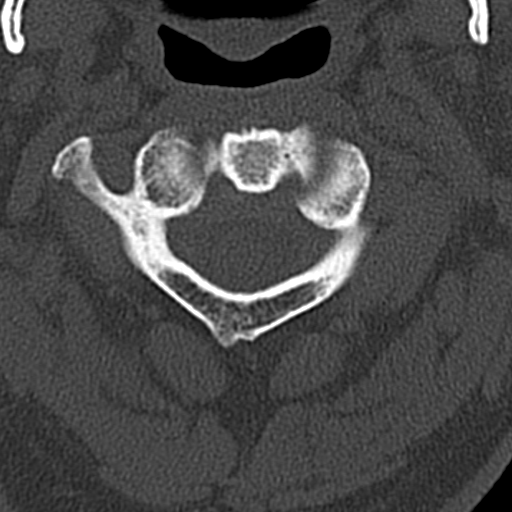

[13 of 33 positions shown; findings below may reference images not displayed]

FINDINGS: Alignment: Normal.

Skull base and vertebrae: The lateral masses are fused from C2
through C5 bilaterally. The vertebral bodies are also fused from
C2-C5. On the sagittal reconstructions there is suggestion of fusion
of the odontoid process with the anterior arch of C1 but this is not
definitive on the axial images.

Soft tissues and spinal canal: No prevertebral fluid or swelling. No
visible canal hematoma.

Disc levels: C2-3: No disc bulging or protrusion. Fusion of the
vertebra and facet joints.

C3-4: No disc bulging or protrusion. Fusion of the vertebral bodies
and facet joints. Slight narrowing of the left neural foramen.

C4-5: No disc bulging or protrusion. Fusion of the vertebral bodies
and facet joints.

C5-6: Small uncinate spurs extend into the right neural foramen
creating moderate right foraminal stenosis. Slight left foraminal
narrowing. Moderate right facet arthritis.

C6-7: Broad-based disc osteophyte complex extending into both neural
foramina with slight right and moderate left foraminal stenosis.

C7-T1: Small broad-based disc osteophyte complex without neural
impingement. No significant foraminal stenosis. Minimal left facet
arthritis.

Upper chest: Negative.

Other: None
IMPRESSION: 1. Multilevel fusion of the vertebral bodies and facet joints from
C2 through C5.
2. Moderate right foraminal stenosis at C5-6 and moderate left
foraminal stenosis at C6-7.

## 2020-08-28 DIAGNOSIS — Z23 Encounter for immunization: Secondary | ICD-10-CM | POA: Diagnosis not present

## 2020-09-30 DIAGNOSIS — I10 Essential (primary) hypertension: Secondary | ICD-10-CM | POA: Diagnosis not present

## 2020-09-30 DIAGNOSIS — R7309 Other abnormal glucose: Secondary | ICD-10-CM | POA: Diagnosis not present

## 2020-09-30 DIAGNOSIS — Z6825 Body mass index (BMI) 25.0-25.9, adult: Secondary | ICD-10-CM | POA: Diagnosis not present

## 2020-09-30 DIAGNOSIS — R972 Elevated prostate specific antigen [PSA]: Secondary | ICD-10-CM | POA: Diagnosis not present

## 2020-09-30 DIAGNOSIS — E7849 Other hyperlipidemia: Secondary | ICD-10-CM | POA: Diagnosis not present

## 2020-11-14 ENCOUNTER — Ambulatory Visit (INDEPENDENT_AMBULATORY_CARE_PROVIDER_SITE_OTHER): Payer: Medicare Other | Admitting: Urology

## 2020-11-14 ENCOUNTER — Encounter: Payer: Self-pay | Admitting: Urology

## 2020-11-14 ENCOUNTER — Other Ambulatory Visit: Payer: Self-pay

## 2020-11-14 VITALS — BP 147/64 | HR 93 | Temp 98.3°F | Ht 73.0 in | Wt 190.0 lb

## 2020-11-14 DIAGNOSIS — R972 Elevated prostate specific antigen [PSA]: Secondary | ICD-10-CM

## 2020-11-14 DIAGNOSIS — R3912 Poor urinary stream: Secondary | ICD-10-CM | POA: Diagnosis not present

## 2020-11-14 DIAGNOSIS — N403 Nodular prostate with lower urinary tract symptoms: Secondary | ICD-10-CM | POA: Diagnosis not present

## 2020-11-14 DIAGNOSIS — R350 Frequency of micturition: Secondary | ICD-10-CM

## 2020-11-14 LAB — URINALYSIS, ROUTINE W REFLEX MICROSCOPIC
Bilirubin, UA: NEGATIVE
Glucose, UA: NEGATIVE
Ketones, UA: NEGATIVE
Leukocytes,UA: NEGATIVE
Nitrite, UA: NEGATIVE
Protein,UA: NEGATIVE
RBC, UA: NEGATIVE
Specific Gravity, UA: 1.025 (ref 1.005–1.030)
Urobilinogen, Ur: 0.2 mg/dL (ref 0.2–1.0)
pH, UA: 5 (ref 5.0–7.5)

## 2020-11-14 MED ORDER — SILDENAFIL CITRATE 20 MG PO TABS: ORAL_TABLET | ORAL | 11 refills | Status: DC

## 2020-11-14 NOTE — Progress Notes (Signed)
Subjective:  1. Elevated PSA   2. Nodular prostate with lower urinary tract symptoms   3. Weak urinary stream   4. Urinary frequency      11/14/20: Mekel returns today in f/u.  He had a recent PSA with Dr. Hilma Favors that was up to 6.4 on 09/30/21.  He had his COVID booster on 09/03/20.   He has stable LUTS with an IPSS of 11 with some frequency and a reduced stream.  He had a single episode of UUI a month ago.  His UA is clear.    He remains on tamsulosin and tadalafil daily.    01/02/20: Clint returns today in f/u for a flow rate and PVR and was given tamsulosin in addition to the tadalafil. His last PSA was  down to 3.6 on 10/20. He is remains on tadalafil daily for ED and LUTS but is not sexually active. He some frequency and urgency and has a reduced stream. His IPSS is 4 which is improved. He has increased issues if he has more than one cup of coffee daily. He had an MRIP in 5/20 that showed an 38ml prostate with no suspicious lesions. He does have a middle lobe that is primarily on the right on MRI.     GU Hx: Mr. Bordner is a 75 yo WM who is sent by Dr. Hilma Favors for an elevated PSA of 4.1. His PSA has been rising steadily for the last few years and was 1.58 in 2011. He had a biopsy for a nodule about 15 years ago. He has moderate LUTS with an IPSS of 11. He is currently on tadalafil which was started about 2 months ago and has helped his symptoms. He has had no hematuria or dysuria. He had a foley in 7/19 when hospitalized and AP with acute pancreatitis possibly from alcohol. He had mild pyuria but a negative culture. He stopped drinking after that. He has a history of Peyronie's disease and had a phalloplasty in 2008 by Dr. Karsten Ro.    IPSS    Row Name 11/14/20 1500         International Prostate Symptom Score   How often have you had the sensation of not emptying your bladder? Less than half the time     How often have you had to urinate less than every two hours? About half the time      How often have you found you stopped and started again several times when you urinated? Less than 1 in 5 times     How often have you found it difficult to postpone urination? Less than half the time     How often have you had a weak urinary stream? About half the time     How often have you had to strain to start urination? Not at All     How many times did you typically get up at night to urinate? 1 Time     Total IPSS Score 12           Quality of Life due to urinary symptoms   If you were to spend the rest of your life with your urinary condition just the way it is now how would you feel about that? Mixed          ROS:  ROS:  A complete review of systems was performed.  All systems are negative except for pertinent findings as noted.   Review of Systems    No Known Allergies  Outpatient Encounter Medications as of 11/14/2020  Medication Sig  . enalapril (VASOTEC) 10 MG tablet Take 10 mg by mouth daily.  . Multiple Vitamins-Minerals (EMERGEN-C VITAMIN C) CHEW Chew 1 tablet by mouth daily.  . pravastatin (PRAVACHOL) 40 MG tablet   . sildenafil (REVATIO) 20 MG tablet 2-3 po 1 hour prior to activity.  . tamsulosin (FLOMAX) 0.4 MG CAPS capsule   . thiamine 100 MG tablet Take 1 tablet (100 mg total) by mouth daily.  Marland Kitchen zolpidem (AMBIEN) 10 MG tablet   . folic acid (FOLVITE) 1 MG tablet Take 1 tablet (1 mg total) by mouth daily.  Marland Kitchen linaclotide (LINZESS) 145 MCG CAPS capsule Take 1 capsule (145 mcg total) by mouth daily before breakfast.   No facility-administered encounter medications on file as of 11/14/2020.    Past Medical History:  Diagnosis Date  . Hypertension     Past Surgical History:  Procedure Laterality Date  . CATARACT EXTRACTION, BILATERAL  5 yrs ago  . COLONOSCOPY  05/28/2011   Procedure: COLONOSCOPY;  Surgeon: Malissa Hippo, MD;  Location: [facility];  Service: Endoscopy;  Laterality: N/A;  . CYSTECTOMY     back & leg  . INGUINAL HERNIA REPAIR  07/01/2012    Procedure: HERNIA REPAIR INGUINAL ADULT;  Surgeon: Dalia Heading, MD;  Location: [facility];  Service: General;  Laterality: Right;  Right Inguinal Herniorraphy with Mesh  . Nesbit  Plication    . TONSILLECTOMY      Social History   Socioeconomic History  . Marital status: Married    Spouse name: Not on file  . Number of children: Not on file  . Years of education: Not on file  . Highest education level: Not on file  Occupational History  . Not on file  Tobacco Use  . Smoking status: Former Smoker    Quit date: 02/24/1978    Years since quitting: 42.7  . Smokeless tobacco: Never Used  Vaping Use  . Vaping Use: Never used  Substance and Sexual Activity  . Alcohol use: Yes  . Drug use: No  . Sexual activity: Yes  Other Topics Concern  . Not on file  Social History Narrative  . Not on file   Social Determinants of Health   Financial Resource Strain: Not on file  Food Insecurity: Not on file  Transportation Needs: Not on file  Physical Activity: Not on file  Stress: Not on file  Social Connections: Not on file  Intimate Partner Violence: Not on file    History reviewed. No pertinent family history.     Objective: Vitals:   11/14/20 1439  BP: (!) 147/64  Pulse: 93  Temp: 98.3 F (36.8 C)     Physical Exam Vitals reviewed.  Constitutional:      Appearance: Normal appearance.  Genitourinary:    Comments: AP without lesions. NST without mass. Prostate 3+ with right base nodule about 47mm.  SV non-palpable.  Neurological:     Mental Status: He is alert.     Lab Results:  Results for orders placed or performed in visit on 11/14/20 (from the past 24 hour(s))  Urinalysis, Routine w reflex microscopic     Status: None   Collection Time: 11/14/20  2:41 PM  Result Value Ref Range   Specific Gravity, UA 1.025 1.005 - 1.030   pH, UA 5.0 5.0 - 7.5   Color, UA Yellow Yellow   Appearance Ur Clear Clear   Leukocytes,UA Negative Negative  Protein,UA Negative  Negative/Trace   Glucose, UA Negative Negative   Ketones, UA Negative Negative   RBC, UA Negative Negative   Bilirubin, UA Negative Negative   Urobilinogen, Ur 0.2 0.2 - 1.0 mg/dL   Nitrite, UA Negative Negative   Microscopic Examination Comment    Narrative   Performed at:  [facility] [address], [place], [place]  [id number] Lab Director: Ingalls, Phone:  [phone]    BMET No results for input(s): NA, K, CL, CO2, GLUCOSE, BUN, CREATININE, CALCIUM in the last 72 hours. PSA  09/30/20: 6.4.   Assessment & Plan: Nodular prostate with BOO with improvement on tamsulosin with the tadalafil.   He will continue that med.  He has a history of a nodule in the past.   Elevated PSA.   His PSA was up 6.4 on recent labs but that was done about a month after the COVID booster which could have impacted the result.   I will get a repeat in 2 months and if back down, see him again in 6 months.   ED for which he takes the tadalafil but it isn't working well.  I sent sildenafil 20mg  for him to had 2-3 tabs prn in addition to the tadalafil.   Meds ordered this encounter  Medications  . sildenafil (REVATIO) 20 MG tablet    Sig: 2-3 po 1 hour prior to activity.    Dispense:  30 tablet    Refill:  11     Orders Placed This Encounter  Procedures  . Urinalysis, Routine w reflex microscopic  . PSA, total and free    Standing Status:   Future    Standing Expiration Date:   02/12/2021      Return in about 6 months (around 05/14/2021).   CC: Sharilyn Sites, MD      Irine Seal 11/14/2020

## 2020-11-14 NOTE — Progress Notes (Signed)
Urological Symptom Review  Patient is experiencing the following symptoms: Frequent urination Hard to postpone urination Get up at night to urinate   Review of Systems  Gastrointestinal (upper)  : Negative for upper GI symptoms  Gastrointestinal (lower) : Negative for lower GI symptoms  Constitutional : Negative for symptoms  Skin: Negative for skin symptoms  Eyes: Negative for eye symptoms  Ear/Nose/Throat : Negative for Ear/Nose/Throat symptoms  Hematologic/Lymphatic: Negative for Hematologic/Lymphatic symptoms  Cardiovascular : Negative for cardiovascular symptoms  Respiratory : Negative for respiratory symptoms  Endocrine: Negative for endocrine symptoms  Musculoskeletal: Negative for musculoskeletal symptoms  Neurological: Negative for neurological symptoms  Psychologic: Negative for psychiatric symptoms  

## 2021-01-07 ENCOUNTER — Other Ambulatory Visit: Payer: Medicare Other

## 2021-01-07 ENCOUNTER — Other Ambulatory Visit: Payer: Self-pay

## 2021-01-07 DIAGNOSIS — R972 Elevated prostate specific antigen [PSA]: Secondary | ICD-10-CM | POA: Diagnosis not present

## 2021-01-07 DIAGNOSIS — N403 Nodular prostate with lower urinary tract symptoms: Secondary | ICD-10-CM

## 2021-01-08 ENCOUNTER — Telehealth: Payer: Self-pay

## 2021-01-08 LAB — PSA, TOTAL AND FREE
PSA, Free Pct: 21.6 %
PSA, Free: 1.66 ng/mL
Prostate Specific Ag, Serum: 7.7 ng/mL — ABNORMAL HIGH (ref 0.0–4.0)

## 2021-01-08 NOTE — Telephone Encounter (Signed)
-----   Message from Irine Seal, MD sent at 01/08/2021  1:15 PM EDT ----- His PSA is up further to 7.7.  He needs f/u to discuss further evaluation.

## 2021-01-08 NOTE — Progress Notes (Signed)
His PSA is up further to 7.7.  He needs f/u to discuss further evaluation.

## 2021-01-08 NOTE — Telephone Encounter (Signed)
Left message to return call to office.

## 2021-01-08 NOTE — Telephone Encounter (Signed)
Patient returned call. Scheduled tomorrow to see MD.

## 2021-01-09 ENCOUNTER — Ambulatory Visit (INDEPENDENT_AMBULATORY_CARE_PROVIDER_SITE_OTHER): Payer: Medicare Other | Admitting: Urology

## 2021-01-09 ENCOUNTER — Other Ambulatory Visit: Payer: Self-pay

## 2021-01-09 ENCOUNTER — Encounter: Payer: Self-pay | Admitting: Urology

## 2021-01-09 VITALS — BP 152/74 | HR 79 | Temp 98.3°F

## 2021-01-09 DIAGNOSIS — R972 Elevated prostate specific antigen [PSA]: Secondary | ICD-10-CM | POA: Diagnosis not present

## 2021-01-09 DIAGNOSIS — N403 Nodular prostate with lower urinary tract symptoms: Secondary | ICD-10-CM | POA: Diagnosis not present

## 2021-01-09 DIAGNOSIS — N5201 Erectile dysfunction due to arterial insufficiency: Secondary | ICD-10-CM | POA: Diagnosis not present

## 2021-01-09 DIAGNOSIS — R3912 Poor urinary stream: Secondary | ICD-10-CM

## 2021-01-09 LAB — URINALYSIS, ROUTINE W REFLEX MICROSCOPIC
Bilirubin, UA: NEGATIVE
Glucose, UA: NEGATIVE
Ketones, UA: NEGATIVE
Leukocytes,UA: NEGATIVE
Nitrite, UA: NEGATIVE
Protein,UA: NEGATIVE
RBC, UA: NEGATIVE
Specific Gravity, UA: 1.02 (ref 1.005–1.030)
Urobilinogen, Ur: 0.2 mg/dL (ref 0.2–1.0)
pH, UA: 7 (ref 5.0–7.5)

## 2021-01-09 MED ORDER — LEVOFLOXACIN 750 MG PO TABS: ORAL_TABLET | ORAL | 0 refills | Status: DC

## 2021-01-09 MED ORDER — SILDENAFIL CITRATE 20 MG PO TABS: ORAL_TABLET | ORAL | 11 refills | Status: DC

## 2021-01-09 NOTE — Progress Notes (Signed)
Urological Symptom Review  Patient is experiencing the following symptoms: Get up at night to urinate Trouble starting stream   Review of Systems  Gastrointestinal (upper)  : Negative for upper GI symptoms  Gastrointestinal (lower) : Negative for lower GI symptoms  Constitutional : Negative for symptoms  Skin: Negative for skin symptoms  Eyes: Negative for eye symptoms  Ear/Nose/Throat : Negative for Ear/Nose/Throat symptoms  Hematologic/Lymphatic: Negative for Hematologic/Lymphatic symptoms  Cardiovascular : Negative for cardiovascular symptoms  Respiratory : Negative for respiratory symptoms  Endocrine: Negative for endocrine symptoms  Musculoskeletal: Back pain  Neurological: Negative for neurological symptoms  Psychologic: Negative for psychiatric symptoms

## 2021-01-09 NOTE — Patient Instructions (Addendum)
   Appointment Time: arrive at [facility] 8am Appointment Date: 01/30/2021  Location: [facility]   Prostate Biopsy Instructions  Stop all aspirin or blood thinners (aspirin, plavix, coumadin, warfarin, motrin, ibuprofen, advil, aleve, naproxen, naprosyn) for 7 days prior to the procedure.  If you have any questions about stopping these medications, please contact your primary care physician or cardiologist.  Having a light meal prior to the procedure is recommended.  If you are diabetic or have low blood sugar please bring a small snack or glucose tablet.  A Fleets enema is needed to be purchased over the counter at a local pharmacy and used 2 hours before you scheduled appointment.  This can be purchased over the counter at any pharmacy.  Antibiotics will be administered in the clinic at the time of the procedure and 1 tablet has been sent to your pharmacy. Please take the antibiotic as prescribed.    Please bring someone with you to the procedure to drive you home if you are given a valium to take prior to your procedure.   If you have any questions or concerns, please feel free to call the office at [phone] or send a Mychart message.    Thank you, [facility]

## 2021-01-09 NOTE — Progress Notes (Signed)
Subjective:  1. Elevated PSA   2. Nodular prostate with lower urinary tract symptoms   3. Weak urinary stream   4. Erectile dysfunction due to arterial insufficiency     01/09/21: Matthew Payne returns today in f/u.  His repeat PSA is up further to 7.7 with a 21.6% f/t ratio.  He reports that he had sex the day before.    It was 6.4 on 09/30/21.   He reports that his voiding symptoms are better with reduced frequency, urgency and nocturia but his IPSS is 16 which is increased.    11/14/20: Matthew Payne returns today in f/u.  He had a recent PSA with Dr. Hilma Favors that was up to 6.4 on 09/30/21.  He had his COVID booster on 09/03/20.   He has stable LUTS with an IPSS of 11 with some frequency and a reduced stream.  He had a single episode of UUI a month ago.  His UA is clear.    He remains on tamsulosin and tadalafil daily.    01/02/20: Matthew Payne returns today in f/u for a flow rate and PVR and was given tamsulosin in addition to the tadalafil. His last PSA was  down to 3.6 on 10/20. He is remains on tadalafil daily for ED and LUTS but is not sexually active. He some frequency and urgency and has a reduced stream. His IPSS is 4 which is improved. He has increased issues if he has more than one cup of coffee daily. He had an MRIP in 5/20 that showed an 73ml prostate with no suspicious lesions. He does have a middle lobe that is primarily on the right on MRI.     GU Hx: Matthew Payne is a 75 yo WM who is sent by Dr. Hilma Favors for an elevated PSA of 4.1. His PSA has been rising steadily for the last few years and was 1.58 in 2011. He had a biopsy for a nodule about 15 years ago. He has moderate LUTS with an IPSS of 11. He is currently on tadalafil which was started about 2 months ago and has helped his symptoms. He has had no hematuria or dysuria. He had a foley in 7/19 when hospitalized and AP with acute pancreatitis possibly from alcohol. He had mild pyuria but a negative culture. He stopped drinking after that. He has a  history of Peyronie's disease and had a phalloplasty in 2008 by Dr. Karsten Ro.    IPSS    Row Name 01/09/21 1000         International Prostate Symptom Score   How often have you had the sensation of not emptying your bladder? Less than half the time     How often have you had to urinate less than every two hours? About half the time     How often have you found you stopped and started again several times when you urinated? Less than half the time     How often have you found it difficult to postpone urination? About half the time     How often have you had a weak urinary stream? About half the time     How often have you had to strain to start urination? Less than half the time     How many times did you typically get up at night to urinate? 1 Time     Total IPSS Score 16           Quality of Life due to urinary symptoms   If you  were to spend the rest of your life with your urinary condition just the way it is now how would you feel about that? Mostly Disatisfied          ROS:  ROS:  A complete review of systems was performed.  All systems are negative except for pertinent findings as noted.   ROS  No Known Allergies  Outpatient Encounter Medications as of 01/09/2021  Medication Sig  . enalapril (VASOTEC) 10 MG tablet Take 10 mg by mouth daily.  . folic acid (FOLVITE) 1 MG tablet Take 1 tablet (1 mg total) by mouth daily.  Marland Kitchen levofloxacin (LEVAQUIN) 750 MG tablet Take one hour prior to procedure.  . linaclotide (LINZESS) 145 MCG CAPS capsule Take 1 capsule (145 mcg total) by mouth daily before breakfast.  . Multiple Vitamins-Minerals (EMERGEN-C VITAMIN C) CHEW Chew 1 tablet by mouth daily.  . pravastatin (PRAVACHOL) 40 MG tablet   . tadalafil (CIALIS) 5 MG tablet Take by mouth.  . tamsulosin (FLOMAX) 0.4 MG CAPS capsule   . thiamine 100 MG tablet Take 1 tablet (100 mg total) by mouth daily.  Marland Kitchen zolpidem (AMBIEN) 10 MG tablet   . [DISCONTINUED] sildenafil (REVATIO) 20 MG  tablet 2-3 po 1 hour prior to activity.  . sildenafil (REVATIO) 20 MG tablet 2-3 po 1 hour prior to activity.   No facility-administered encounter medications on file as of 01/09/2021.    Past Medical History:  Diagnosis Date  . Hypertension     Past Surgical History:  Procedure Laterality Date  . CATARACT EXTRACTION, BILATERAL  5 yrs ago  . COLONOSCOPY  05/28/2011   Procedure: COLONOSCOPY;  Surgeon: [name], MD;  Location: [facility];  Service: Endoscopy;  Laterality: N/A;  . CYSTECTOMY     back & leg  . INGUINAL HERNIA REPAIR  07/01/2012   Procedure: HERNIA REPAIR INGUINAL ADULT;  Surgeon: [name], MD;  Location: [facility];  Service: General;  Laterality: Right;  Right Inguinal Herniorraphy with Mesh  . Nesbit  Plication    . TONSILLECTOMY      Social History   Socioeconomic History  . Marital status: Married    Spouse name: Not on file  . Number of children: Not on file  . Years of education: Not on file  . Highest education level: Not on file  Occupational History  . Not on file  Tobacco Use  . Smoking status: Former Smoker    Quit date: 02/24/1978    Years since quitting: 42.9  . Smokeless tobacco: Never Used  Vaping Use  . Vaping Use: Never used  Substance and Sexual Activity  . Alcohol use: Yes  . Drug use: No  . Sexual activity: Yes  Other Topics Concern  . Not on file  Social History Narrative  . Not on file   Social Determinants of Health   Financial Resource Strain: Not on file  Food Insecurity: Not on file  Transportation Needs: Not on file  Physical Activity: Not on file  Stress: Not on file  Social Connections: Not on file  Intimate Partner Violence: Not on file    History reviewed. No pertinent family history.     Objective: Vitals:   01/09/21 0949  BP: (!) 152/74  Pulse: 79  Temp: 98.3 F (36.8 C)     Physical Exam  Lab Results:  Results for orders placed or performed in visit on 01/09/21 (from the past 24  hour(s))  Urinalysis, Routine w reflex microscopic  Status: None   Collection Time: 01/09/21  9:45 AM  Result Value Ref Range   Specific Gravity, UA 1.020 1.005 - 1.030   pH, UA 7.0 5.0 - 7.5   Color, UA Yellow Yellow   Appearance Ur Clear Clear   Leukocytes,UA Negative Negative   Protein,UA Negative Negative/Trace   Glucose, UA Negative Negative   Ketones, UA Negative Negative   RBC, UA Negative Negative   Bilirubin, UA Negative Negative   Urobilinogen, Ur 0.2 0.2 - 1.0 mg/dL   Nitrite, UA Negative Negative   Microscopic Examination Comment    Narrative   Performed at:  [facility] [address], [place], [place]  [id number] Lab Director: Riley, Phone:  [phone]    BMET No results for input(s): NA, K, CL, CO2, GLUCOSE, BUN, CREATININE, CALCIUM in the last 72 hours. PSA  09/30/20: 6.4.   Assessment & Plan: Nodular prostate with BOO with improvement on tamsulosin with the tadalafil.   He will continue that med.  He has a history of a nodule in the past.   Elevated PSA.   His PSA is up further to 7.7 from 6.4 prior to his visit in January, but he had sex the day prior to the blood draw.  I am going to get a repeat PSA in a week and he will abstain until then, but if the PSA remains up, he will return for a biopsy.  I have reviewed the risks of bleeding, infection and voiding difficulty and sent the Levaquin 750mg  for the prep.  ED for which he takes the tadalafil but it isn't working well.  I  Re-sent sildenafil 20mg  for him to had 2-3 tabs prn in addition to the tadalafil.   Meds ordered this encounter  Medications  . sildenafil (REVATIO) 20 MG tablet    Sig: 2-3 po 1 hour prior to activity.    Dispense:  30 tablet    Refill:  11  . levofloxacin (LEVAQUIN) 750 MG tablet    Sig: Take one hour prior to procedure.    Dispense:  1 tablet    Refill:  0     Orders Placed This Encounter  Procedures  . [place] PROSTATE BIOPSY MULTIPLE    Standing  Status:   Future    Standing Expiration Date:   03/11/2021    Scheduling Instructions:     I am repeating a PSA next week so he will need the biopsy no sooner than 2 weeks out in case we need to cancel.    Order Specific Question:   Reason for Exam (SYMPTOM  OR DIAGNOSIS REQUIRED)    Answer:   elevated PSA    Order Specific Question:   Preferred location?    Answer:   [facility]  . Urinalysis, Routine w reflex microscopic  . PSA, total and free    Standing Status:   Future    Standing Expiration Date:   02/09/2021      Return for Prostate [place] and biopsy. , Next available.   CC: Sharilyn Sites, MD      Irine Seal 01/09/2021

## 2021-01-14 ENCOUNTER — Other Ambulatory Visit: Payer: Self-pay

## 2021-01-14 ENCOUNTER — Other Ambulatory Visit: Payer: Medicare Other

## 2021-01-14 DIAGNOSIS — R972 Elevated prostate specific antigen [PSA]: Secondary | ICD-10-CM | POA: Diagnosis not present

## 2021-01-15 LAB — PSA, TOTAL AND FREE
PSA, Free Pct: 19 %
PSA, Free: 1.52 ng/mL
Prostate Specific Ag, Serum: 8 ng/mL — ABNORMAL HIGH (ref 0.0–4.0)

## 2021-01-17 ENCOUNTER — Telehealth: Payer: Self-pay

## 2021-01-20 NOTE — Telephone Encounter (Signed)
His PSA was up to 8.0 so he needs to come for the biopsy that we discussed.   I don't recall if I sent levaquin 750mg  but I see I did order the biopsy.   Please make sure he has the levaquin and review the prep if that has not been done.   Thanks.

## 2021-01-20 NOTE — Telephone Encounter (Signed)
Biopsy was scheduled by Orinda Kenner., RN and Levaquin is in.

## 2021-01-27 ENCOUNTER — Telehealth: Payer: Self-pay | Admitting: Urology

## 2021-01-27 NOTE — Telephone Encounter (Signed)
Pt called wanting to know the results of his blood work he had a couple weeks ago. Please call him when you can.

## 2021-01-27 NOTE — Telephone Encounter (Signed)
Called pt and gave him PSA results. He said so the biopsy was still going to happen. I explained Dr. Jeffie Pollock has in note biopsy still needed. He expressed understanding.

## 2021-01-30 ENCOUNTER — Other Ambulatory Visit: Payer: Self-pay | Admitting: Urology

## 2021-01-30 ENCOUNTER — Ambulatory Visit (INDEPENDENT_AMBULATORY_CARE_PROVIDER_SITE_OTHER): Payer: Medicare Other | Admitting: Urology

## 2021-01-30 ENCOUNTER — Encounter: Payer: Self-pay | Admitting: Urology

## 2021-01-30 ENCOUNTER — Encounter (HOSPITAL_COMMUNITY): Payer: Self-pay

## 2021-01-30 ENCOUNTER — Ambulatory Visit (HOSPITAL_COMMUNITY)
Admission: RE | Admit: 2021-01-30 | Discharge: 2021-01-30 | Disposition: A | Discharge status: Home / Self Care | Payer: Medicare Other | Source: Ambulatory Visit | Attending: Urology | Admitting: Urology

## 2021-01-30 DIAGNOSIS — R972 Elevated prostate specific antigen [PSA]: Secondary | ICD-10-CM

## 2021-01-30 DIAGNOSIS — C61 Malignant neoplasm of prostate: Secondary | ICD-10-CM | POA: Diagnosis not present

## 2021-01-30 MED ORDER — LIDOCAINE HCL (PF) 2 % IJ SOLN
INTRAMUSCULAR | Status: AC
  Administered 2021-01-30: 10 mL
  Filled 2021-01-30: qty 10

## 2021-01-30 MED ORDER — CEFTRIAXONE SODIUM 1 G IJ SOLR
INTRAMUSCULAR | Status: AC
  Administered 2021-01-30: 1 g via INTRAMUSCULAR
  Filled 2021-01-30: qty 10

## 2021-01-30 MED ORDER — CEFTRIAXONE SODIUM 1 G IJ SOLR: 1.0000 g | Freq: Once | INTRAMUSCULAR | Status: AC

## 2021-01-30 MED ORDER — LIDOCAINE HCL (PF) 1 % IJ SOLN
INTRAMUSCULAR | Status: AC
  Administered 2021-01-30: 2 mL
  Filled 2021-01-30: qty 5

## 2021-01-30 NOTE — Progress Notes (Signed)
Prostate Biopsy Procedure   Informed consent was obtained after discussing risks/benefits of the procedure.  A time out was performed to ensure correct patient identity.  Pre-Procedure: - Last PSA Level:  Lab Results  Component Value Date   PSA1 8.0 (H) 01/14/2021    - Prostate exam findings: benign   - Rocephin 1 gm IM given prophylactically - Levaquin 750 mg administered PO -Transrectal Ultrasound performed using the 10MHz probe.  - Seminal Vesicles: normal - Prostate: round, symmetrical with well defined PZ and TZ.  - Prostate volume:  48 gm  (measurement on images had the length over measured. - Middle lobe: NA - Hypoechoic/Hyperechoic lesions: The PZ in the mid base to mid prostate was relatively hypoechoic. - Prostate calculi: At the TZ/PZ junction bilaterally, left>right in the mid prostate.   Procedure: - Prostate block performed using 10 cc 1% lidocaine and needle biopsies taken from sextant areas, a total of 12 under ultrasound guidance.   1 additional cores was obtained from the midline in the left base medial bottle.  Post-Procedure: - Patient tolerated the procedure well - He was counseled to seek immediate medical attention if experiences any severe pain, significant bleeding, or fevers - He will be called with the biopsy results when available.

## 2021-02-05 ENCOUNTER — Telehealth: Payer: Self-pay | Admitting: Urology

## 2021-02-05 NOTE — Telephone Encounter (Signed)
Pt called wanting to know the results of his biopsy he had done on 01/30/21.

## 2021-02-06 NOTE — Telephone Encounter (Signed)
Patient made aware appointment made for next Thursday.

## 2021-02-06 NOTE — Telephone Encounter (Signed)
-----   Message from Irine Seal, MD sent at 02/06/2021 11:21 AM EDT ----- I have given him results and he needs to be set up for a f/u appointment for a cancer consultation.

## 2021-02-06 NOTE — Progress Notes (Signed)
I have given him results and he needs to be set up for a f/u appointment for a cancer consultation.

## 2021-02-13 ENCOUNTER — Other Ambulatory Visit: Payer: Self-pay

## 2021-02-13 ENCOUNTER — Ambulatory Visit (INDEPENDENT_AMBULATORY_CARE_PROVIDER_SITE_OTHER): Payer: Medicare Other | Admitting: Urology

## 2021-02-13 ENCOUNTER — Encounter: Payer: Self-pay | Admitting: Urology

## 2021-02-13 VITALS — BP 155/67 | HR 76 | Temp 98.4°F | Ht 72.0 in | Wt 180.0 lb

## 2021-02-13 DIAGNOSIS — N5201 Erectile dysfunction due to arterial insufficiency: Secondary | ICD-10-CM | POA: Diagnosis not present

## 2021-02-13 DIAGNOSIS — C61 Malignant neoplasm of prostate: Secondary | ICD-10-CM

## 2021-02-13 DIAGNOSIS — N138 Other obstructive and reflux uropathy: Secondary | ICD-10-CM

## 2021-02-13 DIAGNOSIS — N401 Enlarged prostate with lower urinary tract symptoms: Secondary | ICD-10-CM | POA: Diagnosis not present

## 2021-02-13 DIAGNOSIS — R3915 Urgency of urination: Secondary | ICD-10-CM

## 2021-02-13 NOTE — Progress Notes (Signed)
Subjective:  1. Prostate cancer ([place])   2. Erectile dysfunction due to arterial insufficiency   3. BPH with urinary obstruction   4. Urgency of urination     02/14/21: Matthew Payne returns today in f/u to discuss his prostate biopsy results.   He was found to have a 48 ml prostate with a single core with 5% Gleason 6 prostate cancer with PNI.  There was another core with atypia at the left apex.   He has T2a Nx Mx disease.  He has preexisting ED and BOO.   01/09/21: [name] returns today in f/u.  His repeat PSA is up further to 7.7 with a 21.6% f/t ratio.  He reports that he had sex the day before.    It was 6.4 on 09/30/21.   He reports that his voiding symptoms are better with reduced frequency, urgency and nocturia but his IPSS is 16 which is increased.    11/14/20: [name] returns today in f/u.  He had a recent PSA with Dr. Hilma Favors that was up to 6.4 on 09/30/21.  He had his COVID booster on 09/03/20.   He has stable LUTS with an IPSS of 11 with some frequency and a reduced stream.  He had a single episode of UUI a month ago.  His UA is clear.    He remains on tamsulosin and tadalafil daily.    01/02/20: [name] returns today in f/u for a flow rate and PVR and was given tamsulosin in addition to the tadalafil. His last PSA was  down to 3.6 on 10/20. He is remains on tadalafil daily for ED and LUTS but is not sexually active. He some frequency and urgency and has a reduced stream. His IPSS is 4 which is improved. He has increased issues if he has more than one cup of coffee daily. He had an MRIP in 5/20 that showed an 82ml prostate with no suspicious lesions. He does have a middle lobe that is primarily on the right on MRI.     GU Hx: Matthew Payne is a 75 yo WM who is sent by Dr. Hilma Favors for an elevated PSA of 4.1. His PSA has been rising steadily for the last few years and was 1.58 in 2011. He had a biopsy for a nodule about 15 years ago. He has moderate LUTS with an IPSS of 11. He is currently on tadalafil  which was started about 2 months ago and has helped his symptoms. He has had no hematuria or dysuria. He had a foley in 7/19 when hospitalized and [facility] with acute pancreatitis possibly from alcohol. He had mild pyuria but a negative culture. He stopped drinking after that. He has a history of Peyronie's disease and had a phalloplasty in 2008 by Dr. Karsten Ro.   ROS:  ROS:  A complete review of systems was performed.  All systems are negative except for pertinent findings as noted.   ROS  No Known Allergies  Outpatient Encounter Medications as of 02/13/2021  Medication Sig  . enalapril (VASOTEC) 10 MG tablet Take 10 mg by mouth daily.  . folic acid (FOLVITE) 1 MG tablet Take 1 tablet (1 mg total) by mouth daily.  Marland Kitchen levofloxacin (LEVAQUIN) 750 MG tablet Take one hour prior to procedure.  . linaclotide (LINZESS) 145 MCG CAPS capsule Take 1 capsule (145 mcg total) by mouth daily before breakfast.  . Multiple Vitamins-Minerals (EMERGEN-C VITAMIN C) CHEW Chew 1 tablet by mouth daily.  . pravastatin (PRAVACHOL) 40 MG tablet   .  sildenafil (REVATIO) 20 MG tablet 2-3 po 1 hour prior to activity.  . tadalafil (CIALIS) 5 MG tablet Take by mouth.  . tamsulosin (FLOMAX) 0.4 MG CAPS capsule   . thiamine 100 MG tablet Take 1 tablet (100 mg total) by mouth daily.  Marland Kitchen zolpidem (AMBIEN) 10 MG tablet    No facility-administered encounter medications on file as of 02/13/2021.    Past Medical History:  Diagnosis Date  . Hypercholesteremia   . Hypertension     Past Surgical History:  Procedure Laterality Date  . CATARACT EXTRACTION, BILATERAL  5 yrs ago  . COLONOSCOPY  05/28/2011   Procedure: COLONOSCOPY;  Surgeon: [name], MD;  Location: [facility];  Service: Endoscopy;  Laterality: N/A;  . CYSTECTOMY     back & leg  . INGUINAL HERNIA REPAIR  07/01/2012   Procedure: HERNIA REPAIR INGUINAL ADULT;  Surgeon: [name], MD;  Location: [facility];  Service: General;  Laterality: Right;  Right  Inguinal Herniorraphy with Mesh  . Nesbit  Plication    . TONSILLECTOMY      Social History   Socioeconomic History  . Marital status: Married    Spouse name: Not on file  . Number of children: Not on file  . Years of education: Not on file  . Highest education level: Not on file  Occupational History  . Not on file  Tobacco Use  . Smoking status: Former Smoker    Quit date: 02/24/1978    Years since quitting: 43.0  . Smokeless tobacco: Never Used  Vaping Use  . Vaping Use: Never used  Substance and Sexual Activity  . Alcohol use: Yes  . Drug use: No  . Sexual activity: Yes  Other Topics Concern  . Not on file  Social History Narrative  . Not on file   Social Determinants of Health   Financial Resource Strain: Not on file  Food Insecurity: Not on file  Transportation Needs: Not on file  Physical Activity: Not on file  Stress: Not on file  Social Connections: Not on file  Intimate Partner Violence: Not on file    No family history on file.     Objective: Vitals:   02/13/21 1626  BP: (!) 155/67  Pulse: 76  Temp: 98.4 F (36.9 C)     Physical Exam  Lab Results:  No results found for this or any previous visit (from the past 24 hour(s)).  BMET No results for input(s): NA, K, CL, CO2, GLUCOSE, BUN, CREATININE, CALCIUM in the last 72 hours. PSA  09/30/20: 6.4.  01/07/21:   7.7 with a 21.6% f/t ratio.   Assessment & Plan: Prostate cancer.   T2a Nx Mx Gleason 6 very low risk prostate cancer.  I discussed active surveillance as well as surgery, radiation, cryo and HIFU.   At his age with his comorbidities I believe he will be best served with an initial course of surveillance.   I discussed consideration of finasteride for the coexisting LUTS and reviewed the side effects and the potential impact on the PSA and prostate cancer.   He will just f/u in 6 months with a PSA.    No orders of the defined types were placed in this encounter.    Orders Placed  This Encounter  Procedures  . PSA    Standing Status:   Future    Standing Expiration Date:   02/13/2022      Return in about 6 months (around 08/15/2021)  for with PSA.   CC: Sharilyn Sites, MD      Irine Seal 02/13/2021

## 2021-04-07 DIAGNOSIS — Z6825 Body mass index (BMI) 25.0-25.9, adult: Secondary | ICD-10-CM | POA: Diagnosis not present

## 2021-04-07 DIAGNOSIS — Z1331 Encounter for screening for depression: Secondary | ICD-10-CM | POA: Diagnosis not present

## 2021-04-07 DIAGNOSIS — Z024 Encounter for examination for driving license: Secondary | ICD-10-CM | POA: Diagnosis not present

## 2021-04-07 DIAGNOSIS — K852 Alcohol induced acute pancreatitis without necrosis or infection: Secondary | ICD-10-CM | POA: Diagnosis not present

## 2021-04-07 DIAGNOSIS — C61 Malignant neoplasm of prostate: Secondary | ICD-10-CM | POA: Diagnosis not present

## 2021-04-07 DIAGNOSIS — I10 Essential (primary) hypertension: Secondary | ICD-10-CM | POA: Diagnosis not present

## 2021-04-07 DIAGNOSIS — Z0001 Encounter for general adult medical examination with abnormal findings: Secondary | ICD-10-CM | POA: Diagnosis not present

## 2021-04-07 DIAGNOSIS — E663 Overweight: Secondary | ICD-10-CM | POA: Diagnosis not present

## 2021-04-07 DIAGNOSIS — L570 Actinic keratosis: Secondary | ICD-10-CM | POA: Diagnosis not present

## 2021-04-07 DIAGNOSIS — R972 Elevated prostate specific antigen [PSA]: Secondary | ICD-10-CM | POA: Diagnosis not present

## 2021-04-07 DIAGNOSIS — E7849 Other hyperlipidemia: Secondary | ICD-10-CM | POA: Diagnosis not present

## 2021-04-07 DIAGNOSIS — R7309 Other abnormal glucose: Secondary | ICD-10-CM | POA: Diagnosis not present

## 2021-04-14 DIAGNOSIS — Z23 Encounter for immunization: Secondary | ICD-10-CM | POA: Diagnosis not present

## 2021-05-13 ENCOUNTER — Encounter (INDEPENDENT_AMBULATORY_CARE_PROVIDER_SITE_OTHER): Payer: Self-pay | Admitting: *Deleted

## 2021-05-15 ENCOUNTER — Ambulatory Visit: Payer: Medicare Other | Admitting: Urology

## 2021-05-22 ENCOUNTER — Encounter: Payer: Self-pay | Admitting: Urology

## 2021-05-22 ENCOUNTER — Other Ambulatory Visit: Payer: Self-pay

## 2021-05-22 ENCOUNTER — Ambulatory Visit (INDEPENDENT_AMBULATORY_CARE_PROVIDER_SITE_OTHER): Payer: Medicare Other | Admitting: Urology

## 2021-05-22 VITALS — BP 115/65 | HR 83 | Wt 179.2 lb

## 2021-05-22 DIAGNOSIS — R3915 Urgency of urination: Secondary | ICD-10-CM

## 2021-05-22 DIAGNOSIS — N138 Other obstructive and reflux uropathy: Secondary | ICD-10-CM | POA: Diagnosis not present

## 2021-05-22 DIAGNOSIS — C61 Malignant neoplasm of prostate: Secondary | ICD-10-CM | POA: Diagnosis not present

## 2021-05-22 DIAGNOSIS — N401 Enlarged prostate with lower urinary tract symptoms: Secondary | ICD-10-CM | POA: Diagnosis not present

## 2021-05-22 LAB — URINALYSIS, ROUTINE W REFLEX MICROSCOPIC
Bilirubin, UA: NEGATIVE
Glucose, UA: NEGATIVE
Ketones, UA: NEGATIVE
Leukocytes,UA: NEGATIVE
Nitrite, UA: NEGATIVE
Protein,UA: NEGATIVE
RBC, UA: NEGATIVE
Specific Gravity, UA: >1.03 — ABNORMAL HIGH (ref 1.005–1.030)
Urobilinogen, Ur: 0.2 mg/dL (ref 0.2–1.0)
pH, UA: 5.5 (ref 5.0–7.5)

## 2021-05-22 MED ORDER — SILDENAFIL CITRATE 20 MG PO TABS: ORAL_TABLET | ORAL | 11 refills | Status: DC

## 2021-05-22 NOTE — Progress Notes (Signed)
Urological Symptom Review  Patient is experiencing the following symptoms: Frequent urination Weak stream   Review of Systems  Gastrointestinal (upper)  : Negative for upper GI symptoms  Gastrointestinal (lower) : Negative for lower GI symptoms  Constitutional : Negative for symptoms  Skin: Negative for skin symptoms  Eyes: Negative for eye symptoms  Ear/Nose/Throat : Negative for Ear/Nose/Throat symptoms  Hematologic/Lymphatic: Negative for Hematologic/Lymphatic symptoms  Cardiovascular : Negative for cardiovascular symptoms  Respiratory : Negative for respiratory symptoms  Endocrine: Negative for endocrine symptoms  Musculoskeletal: Negative for musculoskeletal symptoms  Neurological: Negative for neurological symptoms  Psychologic: Negative for psychiatric symptoms

## 2021-05-22 NOTE — Progress Notes (Signed)
Subjective:  1. Prostate cancer (Matthew Payne)   2. BPH with urinary obstruction   3. Urgency of urination     05/22/21: Matthew Payne returns today for the history noted below.  Matthew Payne on AS for low risk prostate cancer found on biopsy on 01/30/21.  He didn't get a PSA prior to this visit but Matthew Payne last was up slightly at 8.0 on 01/14/21.  He remains on tamsulosin and tadalafil '5mg'$  daily for the voiding symptoms.  He doesn't get much erectile response with the tadalafil even with '20mg'$ .    Matthew Payne IPSS is 11.   02/14/21: Matthew Payne returns today in f/u to discuss Matthew Payne prostate biopsy results.   He was found to have a 48 ml prostate with a single core with 5% Gleason 6 prostate cancer with PNI.  There was another core with atypia at the left apex.   He has T2a Nx Mx disease.  He has preexisting ED and BOO.   01/09/21: [name] returns today in f/u.  Matthew Payne repeat PSA is up further to 7.7 with a 21.6% f/t ratio.  He reports that he had sex the day before.    It was 6.4 on 09/30/21.   He reports that Matthew Payne voiding symptoms are better with reduced frequency, urgency and nocturia but Matthew Payne IPSS is 16 which is increased.    11/14/20: Matthew Payne returns today in f/u.  He had a recent PSA with Dr. Hilma Favors that was up to 6.4 on 09/30/21.  He had Matthew Payne COVID booster on 09/03/20.   He has stable LUTS with an IPSS of 11 with some frequency and a reduced stream.  He had a single episode of UUI a month ago.  Matthew Payne UA is clear.    He remains on tamsulosin and tadalafil daily.    01/02/20: Matthew Payne returns today in f/u for a flow rate and PVR and was given tamsulosin in addition to the tadalafil. Matthew Payne last PSA was  down to 3.6 on 10/20. He is remains on tadalafil daily for ED and LUTS but is not sexually active. He some frequency and urgency and has a reduced stream. Matthew Payne IPSS is 4 which is improved. He has increased issues if he has more than one cup of coffee daily. He had an MRIP in 5/20 that showed an 39m prostate with no suspicious lesions. He does have a middle lobe  that is primarily on the right on MRI.     GU Hx: Matthew Payne a 75yo WM who is sent by Dr. GHilma Favorsfor an elevated PSA of 4.1. Matthew Payne PSA has been rising steadily for the last few years and was 1.58 in 2011. He had a biopsy for a nodule about 15 years ago. He has moderate LUTS with an IPSS of 11. He is currently on tadalafil which was started about 2 months ago and has helped Matthew Payne symptoms. He has had no hematuria or dysuria. He had a foley in 7/19 when hospitalized and [facility] with acute pancreatitis possibly from alcohol. He had mild pyuria but a negative culture. He stopped drinking after that. He has a history of Peyronie's disease and had a phalloplasty in 2008 by Dr. OKarsten Ro    IPSS     Row Name 05/22/21 1100         International Prostate Symptom Score   How often have you had the sensation of not emptying your bladder? Less than 1 in 5     How often have you had to urinate less than every  two hours? Less than half the time     How often have you found you stopped and started again several times when you urinated? Less than 1 in 5 times     How often have you found it difficult to postpone urination? Less than 1 in 5 times     How often have you had a weak urinary stream? Almost always     How often have you had to strain to start urination? Not at All     How many times did you typically get up at night to urinate? 1 Time     Total IPSS Score 11           Quality of Life due to urinary symptoms     If you were to spend the rest of your life with your urinary condition just the way it is now how would you feel about that? Mostly Satisfied           ROS:  ROS:  A complete review of systems was performed.  All systems are negative except for pertinent findings as noted.   ROS  No Known Allergies  Outpatient Encounter Medications as of 05/22/2021  Medication Sig   enalapril (VASOTEC) 10 MG tablet Take 10 mg by mouth daily.   folic acid (FOLVITE) 1 MG tablet Take 1 tablet (1 mg  total) by mouth daily.   levofloxacin (LEVAQUIN) 750 MG tablet Take one hour prior to procedure.   linaclotide (LINZESS) 145 MCG CAPS capsule Take 1 capsule (145 mcg total) by mouth daily before breakfast.   Multiple Vitamins-Minerals (EMERGEN-C VITAMIN C) CHEW Chew 1 tablet by mouth daily.   pravastatin (PRAVACHOL) 40 MG tablet    sildenafil (REVATIO) 20 MG tablet 2-3 po 1 hour prior to activity.   tadalafil (CIALIS) 5 MG tablet Take by mouth.   tamsulosin (FLOMAX) 0.4 MG CAPS capsule    thiamine 100 MG tablet Take 1 tablet (100 mg total) by mouth daily.   zolpidem (AMBIEN) 10 MG tablet    [DISCONTINUED] sildenafil (REVATIO) 20 MG tablet 2-3 po 1 hour prior to activity.   No facility-administered encounter medications on file as of 05/22/2021.    Past Medical History:  Diagnosis Date   Hypercholesteremia    Hypertension     Past Surgical History:  Procedure Laterality Date   CATARACT EXTRACTION, BILATERAL  5 yrs ago   COLONOSCOPY  05/28/2011   Procedure: COLONOSCOPY;  Surgeon: [name], MD;  [facility];  Service: Endoscopy;  Laterality: N/A;   CYSTECTOMY     back & leg   INGUINAL HERNIA REPAIR  07/01/2012   Procedure: HERNIA REPAIR INGUINAL ADULT;  Surgeon: [name], MD;  Location: [facility];  Service: General;  Laterality: Right;  Right Inguinal Herniorraphy with Mesh   Nesbit  Plication     TONSILLECTOMY      Social History   Socioeconomic History   Marital status: Married    Spouse name: Not on file   Number of children: Not on file   Years of education: Not on file   Highest education level: Not on file  Occupational History   Not on file  Tobacco Use   Smoking status: Former    Types: Cigarettes    Quit date: 02/24/1978    Years since quitting: 43.2   Smokeless tobacco: Never  Vaping Use   Vaping Use: Never used  Substance and Sexual Activity   Alcohol use: Yes  Drug use: No   Sexual activity: Yes  Other Topics Concern   Not on file   Social History Narrative   Not on file   Social Determinants of Health   Financial Resource Strain: Not on file  Food Insecurity: Not on file  Transportation Needs: Not on file  Physical Activity: Not on file  Stress: Not on file  Social Connections: Not on file  Intimate Partner Violence: Not on file    No family history on file.     Objective: Vitals:   05/22/21 1100  BP: 115/65  Pulse: 83     Physical Exam  Lab Results:  Results for orders placed or performed in visit on 05/22/21 (from the past 24 hour(s))  Urinalysis, Routine w reflex microscopic     Status: Abnormal   Collection Time: 05/22/21 11:02 AM  Result Value Ref Range   Specific Gravity, UA >1.030 (H) 1.005 - 1.030   pH, UA 5.5 5.0 - 7.5   Color, UA Amber (A) Yellow   Appearance Ur Clear Clear   Leukocytes,UA Negative Negative   Protein,UA Negative Negative/Trace   Glucose, UA Negative Negative   Ketones, UA Negative Negative   RBC, UA Negative Negative   Bilirubin, UA Negative Negative   Urobilinogen, Ur 0.2 0.2 - 1.0 mg/dL   Nitrite, UA Negative Negative   Microscopic Examination Comment    Narrative   Performed at:  [facility] [address], [place], [place]  [id number] Lab Director: Mina Marble MT, Phone:  [phone]    BMET No results for input(s): NA, K, CL, CO2, GLUCOSE, BUN, CREATININE, CALCIUM in the last 72 hours. PSA  09/30/20: 6.4.  01/07/21:   7.7 with a 21.6% f/t ratio.  01/14/21   8.0  with 19% f/t ratio.   Assessment & Plan: Prostate cancer.   T2a Nx Mx Gleason 6 very low risk prostate cancer.  I discussed active surveillance as well as surgery, radiation, cryo and HIFU.   At Matthew Payne age with Matthew Payne comorbidities I believe he will be best served with an initial course of surveillance.   I discussed consideration of finasteride for the coexisting LUTS and reviewed the side effects and the potential impact on the PSA and prostate cancer.   He get a PSA today and in 6  months.   BPH with BOO.   Continue tamsulosin and tadalafil.   ED with incomplete response to tadalafil.   I will have him take sildenafil 40-'60mg'$  in conjunction with the daily tadalafil but make sure he takes it more than four hours before or after the tamsulosin..    Meds ordered this encounter  Medications   sildenafil (REVATIO) 20 MG tablet    Sig: 2-3 po 1 hour prior to activity.    Dispense:  30 tablet    Refill:  11     Orders Placed This Encounter  Procedures   Urinalysis, Routine w reflex microscopic   PSA      Return in about 3 months (around 08/22/2021) for He needs f/u in 3 months with a PSA which was previously ordered for 08/15/21. Marland Kitchen   CC: Sharilyn Sites, MD      Irine Seal 05/22/2021

## 2021-05-23 ENCOUNTER — Telehealth: Payer: Self-pay

## 2021-05-23 DIAGNOSIS — R972 Elevated prostate specific antigen [PSA]: Secondary | ICD-10-CM

## 2021-05-23 LAB — PSA: Prostate Specific Ag, Serum: 10.8 ng/mL — ABNORMAL HIGH (ref 0.0–4.0)

## 2021-05-23 NOTE — Telephone Encounter (Signed)
Order placed. Patient called and made aware.

## 2021-05-23 NOTE — Telephone Encounter (Signed)
-----   Message from Irine Seal, MD sent at 05/23/2021 11:32 AM EDT ----- The PSA is up to 10.8 from 8 in march.  He needs a prostate MRI and will need f/u with the results.  ----- Message ----- From: Dorisann Frames, RN Sent: 05/23/2021   9:15 AM EDT To: Irine Seal, MD  Please review

## 2021-06-17 ENCOUNTER — Ambulatory Visit
Admission: RE | Admit: 2021-06-17 | Discharge: 2021-06-17 | Disposition: A | Discharge status: Home / Self Care | Payer: Medicare Other | Source: Ambulatory Visit | Attending: Urology | Admitting: Urology

## 2021-06-17 DIAGNOSIS — R972 Elevated prostate specific antigen [PSA]: Secondary | ICD-10-CM

## 2021-06-17 MED ORDER — GADOBENATE DIMEGLUMINE 529 MG/ML IV SOLN
16.0000 mL | Freq: Once | INTRAVENOUS | Status: AC | PRN
  Administered 2021-06-17: 16 mL via INTRAVENOUS

## 2021-06-26 NOTE — Progress Notes (Signed)
Sent via mychart

## 2021-06-27 ENCOUNTER — Telehealth: Payer: Self-pay

## 2021-06-27 NOTE — Telephone Encounter (Signed)
-----   Message from Irine Seal, MD sent at 06/20/2021  8:26 AM EDT ----- His MRI prostate result got sent to [facility].  He has a 146m prostate and no high risk lesions noted on the study.  He needs to keep his f/u which I believe is in October.

## 2021-06-27 NOTE — Telephone Encounter (Signed)
Pt called and notified pf results. Voiced understanding.

## 2021-07-15 ENCOUNTER — Encounter (INDEPENDENT_AMBULATORY_CARE_PROVIDER_SITE_OTHER): Payer: Self-pay

## 2021-07-15 ENCOUNTER — Telehealth (INDEPENDENT_AMBULATORY_CARE_PROVIDER_SITE_OTHER): Payer: Self-pay

## 2021-07-15 ENCOUNTER — Other Ambulatory Visit (INDEPENDENT_AMBULATORY_CARE_PROVIDER_SITE_OTHER): Payer: Self-pay

## 2021-07-15 DIAGNOSIS — Z1211 Encounter for screening for malignant neoplasm of colon: Secondary | ICD-10-CM

## 2021-07-15 MED ORDER — PEG 3350-KCL-NA BICARB-NACL 420 G PO SOLR: 4000.0000 mL | ORAL | 0 refills | Status: DC

## 2021-07-15 NOTE — Telephone Encounter (Signed)
Referring MD/PCP: Hilma Favors  Procedure: tcs  Reason/Indication:  Screening  Has patient had this procedure before?  yes  If so, when, by whom and where?  2012  Is there a family history of colon cancer?  no  Who?  What age when diagnosed?    Is patient diabetic? If yes, Type 1 or Type 2   no      Does patient have prosthetic heart valve or mechanical valve?  no  Do you have a pacemaker/defibrillator?  no  Has patient ever had endocarditis/atrial fibrillation? no  Does patient use oxygen? no  Has patient had joint replacement within last 12 months?  no  Is patient constipated or do they take laxatives? no  Does patient have a history of alcohol/drug use?  no  Have you had a stroke/heart attack last 6 mths? no  Do you take medicine for weight loss?  no  For male patients,: do you still have your menstrual cycle? N/A  Is patient on blood thinner such as Coumadin, Plavix and/or Aspirin? no  Medications: tamsulosin 0.4 mg daily, pravastatin 40 mg daily, enalapril 10 mg daily, ester c daily, aleve prn   Allergies: nkda  Medication Adjustment per Dr Laural Golden None  Procedure date & time: 07/31/21 at 12:30

## 2021-07-15 NOTE — Telephone Encounter (Signed)
LeighAnn Kamalei Roeder, CMA  

## 2021-07-23 ENCOUNTER — Encounter (INDEPENDENT_AMBULATORY_CARE_PROVIDER_SITE_OTHER): Payer: Self-pay

## 2021-07-23 ENCOUNTER — Telehealth (INDEPENDENT_AMBULATORY_CARE_PROVIDER_SITE_OTHER): Payer: Self-pay

## 2021-07-23 MED ORDER — SUTAB 1479-225-188 MG PO TABS: 1.0000 | ORAL_TABLET | Freq: Once | ORAL | 0 refills | Status: AC

## 2021-07-23 NOTE — Telephone Encounter (Signed)
Matthew Payne, CMA  

## 2021-07-24 DIAGNOSIS — Z23 Encounter for immunization: Secondary | ICD-10-CM | POA: Diagnosis not present

## 2021-07-29 ENCOUNTER — Other Ambulatory Visit: Payer: Medicare Other

## 2021-07-29 ENCOUNTER — Telehealth (INDEPENDENT_AMBULATORY_CARE_PROVIDER_SITE_OTHER): Payer: Self-pay

## 2021-07-29 NOTE — Telephone Encounter (Signed)
Patient came by the office today stating his Sutab cost $165.82 at [facility] he can not afford. I have given the patient a sample that was here at the office Lot # [id number] Exp Date of 03/2023.

## 2021-07-31 ENCOUNTER — Ambulatory Visit (HOSPITAL_COMMUNITY)
Admission: RE | Admit: 2021-07-31 | Discharge: 2021-07-31 | Disposition: A | Discharge status: Home / Self Care | Payer: Medicare Other | Attending: Internal Medicine | Admitting: Internal Medicine

## 2021-07-31 ENCOUNTER — Encounter (HOSPITAL_COMMUNITY): Payer: Self-pay | Admitting: Internal Medicine

## 2021-07-31 ENCOUNTER — Encounter (HOSPITAL_COMMUNITY)
Admission: RE | Disposition: A | Discharge status: Home / Self Care | Payer: Self-pay | Source: Home / Self Care | Attending: Internal Medicine

## 2021-07-31 ENCOUNTER — Other Ambulatory Visit: Payer: Self-pay

## 2021-07-31 DIAGNOSIS — E7800 Pure hypercholesterolemia, unspecified: Secondary | ICD-10-CM | POA: Insufficient documentation

## 2021-07-31 DIAGNOSIS — Z1211 Encounter for screening for malignant neoplasm of colon: Secondary | ICD-10-CM | POA: Diagnosis not present

## 2021-07-31 DIAGNOSIS — I10 Essential (primary) hypertension: Secondary | ICD-10-CM | POA: Diagnosis not present

## 2021-07-31 DIAGNOSIS — K5730 Diverticulosis of large intestine without perforation or abscess without bleeding: Secondary | ICD-10-CM | POA: Insufficient documentation

## 2021-07-31 DIAGNOSIS — Z87891 Personal history of nicotine dependence: Secondary | ICD-10-CM | POA: Insufficient documentation

## 2021-07-31 DIAGNOSIS — Z79899 Other long term (current) drug therapy: Secondary | ICD-10-CM | POA: Insufficient documentation

## 2021-07-31 DIAGNOSIS — D120 Benign neoplasm of cecum: Secondary | ICD-10-CM | POA: Diagnosis not present

## 2021-07-31 DIAGNOSIS — D123 Benign neoplasm of transverse colon: Secondary | ICD-10-CM | POA: Diagnosis not present

## 2021-07-31 HISTORY — PX: POLYPECTOMY: SHX5525

## 2021-07-31 HISTORY — PX: COLONOSCOPY: SHX5424

## 2021-07-31 LAB — HM COLONOSCOPY

## 2021-07-31 SURGERY — COLONOSCOPY
Anesthesia: Moderate Sedation

## 2021-07-31 MED ORDER — MIDAZOLAM HCL 5 MG/5ML IJ SOLN
INTRAMUSCULAR | Status: AC
  Filled 2021-07-31: qty 10

## 2021-07-31 MED ORDER — MIDAZOLAM HCL 5 MG/5ML IJ SOLN
INTRAMUSCULAR | Status: DC | PRN
  Administered 2021-07-31: 2 mg via INTRAVENOUS
  Administered 2021-07-31: 1 mg via INTRAVENOUS
  Administered 2021-07-31: 2 mg via INTRAVENOUS

## 2021-07-31 MED ORDER — MEPERIDINE HCL 50 MG/ML IJ SOLN
INTRAMUSCULAR | Status: AC
  Filled 2021-07-31: qty 1

## 2021-07-31 MED ORDER — STERILE WATER FOR IRRIGATION IR SOLN
Status: DC | PRN
  Administered 2021-07-31: 1.3 mL

## 2021-07-31 MED ORDER — MEPERIDINE HCL 50 MG/ML IJ SOLN
INTRAMUSCULAR | Status: DC | PRN
  Administered 2021-07-31 (×2): 25 mg via INTRAVENOUS

## 2021-07-31 MED ORDER — SODIUM CHLORIDE 0.9 % IV SOLN: INTRAVENOUS | Status: DC

## 2021-07-31 NOTE — Discharge Instructions (Signed)
No aspirin or NSAIDs for 24 hours.   °Resume usual medications as before. °High-fiber diet. °No driving for 24 hours. °Physician will call with biopsy results. °

## 2021-07-31 NOTE — H&P (Signed)
Patient is here for colonoscopy.  Matthew Payne is an 75 y.o. male.   Chief Complaint: Patient is here for colonoscopy. HPI: Patient is 75 year old Caucasian male who is here for screening colonoscopy.  He denies abdominal pain change in bowel habits or rectal bleeding.  Last exam was normal 10 years ago.  Family history is negative for CRC.  He does not take aspirin or anticoagulants.  He takes Aleve on as-needed basis.   Past Medical History:  Diagnosis Date   Hypercholesteremia    Hypertension     Past Surgical History:  Procedure Laterality Date   CATARACT EXTRACTION, BILATERAL  5 yrs ago   COLONOSCOPY  05/28/2011   Procedure: COLONOSCOPY;  Surgeon: [name], MD;  [facility];  Service: Endoscopy;  Laterality: N/A;   CYSTECTOMY     back & leg   INGUINAL HERNIA REPAIR  07/01/2012   Procedure: HERNIA REPAIR INGUINAL ADULT;  Surgeon: [name], MD;  Location: [facility];  Service: General;  Laterality: Right;  Right Inguinal Herniorraphy with Mesh   Nesbit  Plication     TONSILLECTOMY      History reviewed. No pertinent family history. Social History:  reports that he quit smoking about 43 years ago. His smoking use included cigarettes. He has never used smokeless tobacco. He reports current alcohol use. He reports that he does not use drugs.  Allergies: No Known Allergies  Medications Prior to Admission  Medication Sig Dispense Refill   Bioflavonoid Products (ESTER C PO) Take 1,000 mg by mouth daily.     enalapril (VASOTEC) 10 MG tablet Take 10 mg by mouth daily.     pravastatin (PRAVACHOL) 40 MG tablet Take 40 mg by mouth daily.     tadalafil (CIALIS) 5 MG tablet Take 5 mg by mouth daily.     tamsulosin (FLOMAX) 0.4 MG CAPS capsule Take 0.4 mg by mouth daily.     levofloxacin (LEVAQUIN) 750 MG tablet Take one hour prior to procedure. (Patient not taking: Reported on 07/24/2021) 1 tablet 0   naproxen sodium (ALEVE) 220 MG tablet Take 440 mg by mouth daily as  needed (pain).     polyethylene glycol-electrolytes (TRILYTE) 420 g solution Take 4,000 mLs by mouth as directed. 4000 mL 0    No results found for this or any previous visit (from the past 48 hour(s)). No results found.  Review of Systems  Blood pressure 138/79, pulse 79, temperature 98.2 F (36.8 C), temperature source Oral, resp. rate 17, height 6' (1.829 m), weight 81.6 kg, SpO2 98 %. Physical Exam HENT:     Mouth/Throat:     Mouth: Mucous membranes are moist.     Pharynx: Oropharynx is clear.  Eyes:     General: No scleral icterus.    Conjunctiva/sclera: Conjunctivae normal.  Cardiovascular:     Rate and Rhythm: Normal rate and regular rhythm.     Heart sounds: Normal heart sounds. No murmur heard. Pulmonary:     Effort: Pulmonary effort is normal.     Breath sounds: Normal breath sounds.  Abdominal:     Comments: Right inguinal herniorrhaphy scar  Musculoskeletal:     Cervical back: Neck supple.  Lymphadenopathy:     Cervical: No cervical adenopathy.  Skin:    General: Skin is warm and dry.  Neurological:     Mental Status: He is alert.     Assessment/Plan  Average risk screening colonoscopy.  Hildred Laser, MD 07/31/2021, 1:12 PM

## 2021-07-31 NOTE — Op Note (Signed)
[facility] Patient Name: Matthew Payne Procedure Date: 07/31/2021 12:47 PM MRN: [id number] Date of Birth: 20-Apr-1946 Attending MD: Hildred Laser , MD CSN: [id number] Age: 75 Admit Type: Outpatient Procedure:                Colonoscopy Indications:              Screening for colorectal malignant neoplasm Providers:                Hildred Laser, MD, Charlsie Quest. Insurance claims handler, Therapist, sports,                            Suzan Garibaldi. Risa Grill, Technician Referring MD:             Halford Chessman, MD Medicines:                Meperidine 50 mg IV, Midazolam 5 mg IV Complications:            No immediate complications. Estimated Blood Loss:     Estimated blood loss was minimal. Procedure:                Pre-Anesthesia Assessment:                           - Prior to the procedure, a History and Physical                            was performed, and patient medications and                            allergies were reviewed. The patient's tolerance of                            previous anesthesia was also reviewed. The risks                            and benefits of the procedure and the sedation                            options and risks were discussed with the patient.                            All questions were answered, and informed consent                            was obtained. Prior Anticoagulants: The patient has                            taken no previous anticoagulant or antiplatelet                            agents except for NSAID medication. ASA Grade                            Assessment: II - A patient with mild systemic  disease. After reviewing the risks and benefits,                            the patient was deemed in satisfactory condition to                            undergo the procedure.                           After obtaining informed consent, the colonoscope                            was passed under direct vision. Throughout the                             procedure, the patient's blood pressure, pulse, and                            oxygen saturations were monitored continuously. The                            PCF-HQ190L ([id number]) scope was introduced through                            the anus and advanced to the the cecum, identified                            by appendiceal orifice and ileocecal valve. The                            colonoscopy was performed without difficulty. The                            patient tolerated the procedure well. The quality                            of the bowel preparation was adequate. The                            ileocecal valve, appendiceal orifice, and rectum                            were photographed. Scope In: 1:24:34 PM Scope Out: 1:44:47 PM Scope Withdrawal Time: 0 hours 9 minutes 39 seconds  Total Procedure Duration: 0 hours 20 minutes 13 seconds  Findings:      The perianal and digital rectal examinations were normal.      Two polyps were found in the distal transverse colon and cecum. The       polyps were 3 to 5 mm in size. These polyps were removed with a cold       snare. Resection and retrieval were complete. The pathology specimen was       placed into Bottle Number 1.      A few medium-mouthed diverticula were found in the sigmoid colon.      The  retroflexed view of the distal rectum and anal verge was normal and       showed no anal or rectal abnormalities. Impression:               - Two 3 to 5 mm polyps in the distal transverse                            colon and in the cecum, removed with a cold snare.                            Resected and retrieved.                           - Diverticulosis in the sigmoid colon. Moderate Sedation:      Moderate (conscious) sedation was administered by the endoscopy nurse       and supervised by the endoscopist. The following parameters were       monitored: oxygen saturation, heart rate, blood pressure, CO2        capnography and response to care. Total physician intraservice time was       25 minutes. Recommendation:           - Patient has a contact number available for                            emergencies. The signs and symptoms of potential                            delayed complications were discussed with the                            patient. Return to normal activities tomorrow.                            Written discharge instructions were provided to the                            patient.                           - High fiber diet today.                           - Continue present medications.                           - No aspirin, ibuprofen, naproxen, or other                            non-steroidal anti-inflammatory drugs for 1 day.                           - Await pathology results.                           - Repeat colonoscopy is recommended. The  colonoscopy date will be determined after pathology                            results from today's exam become available for                            review. Procedure Code(s):        --- Professional ---                           [phone], Colonoscopy, flexible; with removal of                            tumor(s), polyp(s), or other lesion(s) by snare                            technique                           99153, Moderate sedation; each additional 15                            minutes intraservice time                           G0500, Moderate sedation services provided by the                            same physician or other qualified health care                            professional performing a gastrointestinal                            endoscopic service that sedation supports,                            requiring the presence of an independent trained                            observer to assist in the monitoring of the                            patient's level of consciousness and  physiological                            status; initial 15 minutes of intra-service time;                            patient age 25 years or older (additional time may                            be reported with [phone], as appropriate) Diagnosis Code(s):        --- Professional ---  Z12.11, Encounter for screening for malignant                            neoplasm of colon                           K63.5, Polyp of colon                           K57.30, Diverticulosis of large intestine without                            perforation or abscess without bleeding CPT copyright 2019 American Medical Association. All rights reserved. The codes documented in this report are preliminary and upon coder review may  be revised to meet current compliance requirements. Hildred Laser, MD Hildred Laser, MD 07/31/2021 1:52:19 PM This report has been signed electronically. Number of Addenda: 0

## 2021-08-04 LAB — SURGICAL PATHOLOGY

## 2021-08-05 ENCOUNTER — Encounter (HOSPITAL_COMMUNITY): Payer: Self-pay | Admitting: Internal Medicine

## 2021-08-07 ENCOUNTER — Ambulatory Visit: Payer: Medicare Other | Admitting: Urology

## 2021-08-12 ENCOUNTER — Other Ambulatory Visit: Payer: Medicare Other

## 2021-08-12 ENCOUNTER — Other Ambulatory Visit: Payer: Self-pay

## 2021-08-12 DIAGNOSIS — R42 Dizziness and giddiness: Secondary | ICD-10-CM | POA: Diagnosis not present

## 2021-08-12 DIAGNOSIS — E663 Overweight: Secondary | ICD-10-CM | POA: Diagnosis not present

## 2021-08-12 DIAGNOSIS — M25511 Pain in right shoulder: Secondary | ICD-10-CM | POA: Diagnosis not present

## 2021-08-12 DIAGNOSIS — Z6824 Body mass index (BMI) 24.0-24.9, adult: Secondary | ICD-10-CM | POA: Diagnosis not present

## 2021-08-12 DIAGNOSIS — C61 Malignant neoplasm of prostate: Secondary | ICD-10-CM

## 2021-08-12 DIAGNOSIS — B351 Tinea unguium: Secondary | ICD-10-CM | POA: Diagnosis not present

## 2021-08-12 DIAGNOSIS — H6121 Impacted cerumen, right ear: Secondary | ICD-10-CM | POA: Diagnosis not present

## 2021-08-13 LAB — PSA: Prostate Specific Ag, Serum: 8.4 ng/mL — ABNORMAL HIGH (ref 0.0–4.0)

## 2021-08-14 ENCOUNTER — Encounter (INDEPENDENT_AMBULATORY_CARE_PROVIDER_SITE_OTHER): Payer: Self-pay | Admitting: *Deleted

## 2021-08-14 ENCOUNTER — Other Ambulatory Visit: Payer: Medicare Other

## 2021-08-21 ENCOUNTER — Other Ambulatory Visit: Payer: Self-pay

## 2021-08-21 ENCOUNTER — Encounter: Payer: Self-pay | Admitting: Urology

## 2021-08-21 ENCOUNTER — Ambulatory Visit (INDEPENDENT_AMBULATORY_CARE_PROVIDER_SITE_OTHER): Payer: Medicare Other | Admitting: Urology

## 2021-08-21 VITALS — BP 150/65 | HR 81 | Temp 98.4°F

## 2021-08-21 DIAGNOSIS — N5201 Erectile dysfunction due to arterial insufficiency: Secondary | ICD-10-CM

## 2021-08-21 DIAGNOSIS — R3915 Urgency of urination: Secondary | ICD-10-CM | POA: Diagnosis not present

## 2021-08-21 DIAGNOSIS — N403 Nodular prostate with lower urinary tract symptoms: Secondary | ICD-10-CM | POA: Diagnosis not present

## 2021-08-21 DIAGNOSIS — C61 Malignant neoplasm of prostate: Secondary | ICD-10-CM | POA: Diagnosis not present

## 2021-08-21 DIAGNOSIS — R972 Elevated prostate specific antigen [PSA]: Secondary | ICD-10-CM

## 2021-08-21 LAB — URINALYSIS, ROUTINE W REFLEX MICROSCOPIC
Bilirubin, UA: NEGATIVE
Glucose, UA: NEGATIVE
Ketones, UA: NEGATIVE
Leukocytes,UA: NEGATIVE
Nitrite, UA: NEGATIVE
Protein,UA: NEGATIVE
RBC, UA: NEGATIVE
Specific Gravity, UA: >1.03 — ABNORMAL HIGH (ref 1.005–1.030)
Urobilinogen, Ur: 0.2 mg/dL (ref 0.2–1.0)
pH, UA: 5.5 (ref 5.0–7.5)

## 2021-08-21 NOTE — Progress Notes (Signed)
Subjective:  1. Prostate cancer ([place])   2. Urgency of urination   3. Elevated PSA   4. Nodular prostate with lower urinary tract symptoms   5. Erectile dysfunction due to arterial insufficiency     08/21/21: Matthew Payne returns today in f/u.   He remains on AS for low risk prostate cancer that was diagnosed on 01/30/21.  His PSA is 8.4 which is back down from 10.8 on 05/22/21.   He is voiding better with reduced nocturia. He has some urgency.  He has no dysuria or hematuria.   His IPSS is 8.  He remains on tamsulosin but is off of the tadalafil.   He was having some dizziness.  He wasn't responding to the tadalafil.    05/22/21: Matthew Payne returns today for the history noted below.  His on AS for low risk prostate cancer found on biopsy on 01/30/21.  He didn't get a PSA prior to this visit but his last was up slightly at 8.0 on 01/14/21.  He remains on tamsulosin and tadalafil 5mg  daily for the voiding symptoms.  He doesn't get much erectile response with the tadalafil even with 20mg .    His IPSS is 11.   02/14/21: Matthew Payne returns today in f/u to discuss his prostate biopsy results.   He was found to have a 48 ml prostate with a single core with 5% Gleason 6 prostate cancer with PNI.  There was another core with atypia at the left apex.   He has T2a Nx Mx disease.  He has preexisting ED and BOO.   01/09/21: Matthew Payne returns today in f/u.  His repeat PSA is up further to 7.7 with a 21.6% f/t ratio.  He reports that he had sex the day before.    It was 6.4 on 09/30/21.   He reports that his voiding symptoms are better with reduced frequency, urgency and nocturia but his IPSS is 16 which is increased.    11/14/20: Matthew Payne returns today in f/u.  He had a recent PSA with Dr. Hilma Favors that was up to 6.4 on 09/30/21.  He had his COVID booster on 09/03/20.   He has stable LUTS with an IPSS of 11 with some frequency and a reduced stream.  He had a single episode of UUI a month ago.  His UA is clear.    He remains on tamsulosin and  tadalafil daily.    01/02/20: Matthew Payne returns today in f/u for a flow rate and PVR and was given tamsulosin in addition to the tadalafil. His last PSA was  down to 3.6 on 10/20. He is remains on tadalafil daily for ED and LUTS but is not sexually active. He some frequency and urgency and has a reduced stream. His IPSS is 4 which is improved. He has increased issues if he has more than one cup of coffee daily. He had an MRIP in 5/20 that showed an 77ml prostate with no suspicious lesions. He does have a middle lobe that is primarily on the right on MRI.     GU Hx: Matthew Payne is a 75 yo WM who is sent by Dr. Hilma Favors for an elevated PSA of 4.1. His PSA has been rising steadily for the last few years and was 1.58 in 2011. He had a biopsy for a nodule about 15 years ago. He has moderate LUTS with an IPSS of 11. He is currently on tadalafil which was started about 2 months ago and has helped his symptoms. He has had  no hematuria or dysuria. He had a foley in 7/19 when hospitalized and [facility] with acute pancreatitis possibly from alcohol. He had mild pyuria but a negative culture. He stopped drinking after that. He has a history of Peyronie's disease and had a phalloplasty in 2008 by Dr. Karsten Ro.    IPSS     Row Name 08/21/21 0900         International Prostate Symptom Payne   How often have you had the sensation of not emptying your bladder? Less than 1 in 5     How often have you had to urinate less than every two hours? Less than half the time     How often have you found you stopped and started again several times when you urinated? Less than 1 in 5 times     How often have you found it difficult to postpone urination? Less than half the time     How often have you had a weak urinary stream? Less than 1 in 5 times     How often have you had to strain to start urination? Not at All     How many times did you typically get up at night to urinate? 1 Time     Total IPSS Payne 8       Quality of Life due to  urinary symptoms   If you were to spend the rest of your life with your urinary condition just the way it is now how would you feel about that? Mixed             ROS:  ROS:  A complete review of systems was performed.  All systems are negative except for pertinent findings as noted.   ROS  No Known Allergies  Outpatient Encounter Medications as of 08/21/2021  Medication Sig   Bioflavonoid Products (ESTER C PO) Take 1,000 mg by mouth daily.   enalapril (VASOTEC) 10 MG tablet Take 10 mg by mouth daily.   pravastatin (PRAVACHOL) 40 MG tablet Take 40 mg by mouth daily.   tamsulosin (FLOMAX) 0.4 MG CAPS capsule Take 0.4 mg by mouth daily.   [DISCONTINUED] naproxen sodium (ALEVE) 220 MG tablet Take 440 mg by mouth daily as needed (pain).   [DISCONTINUED] tadalafil (CIALIS) 5 MG tablet Take 5 mg by mouth daily.   No facility-administered encounter medications on file as of 08/21/2021.    Past Medical History:  Diagnosis Date   Hypercholesteremia    Hypertension     Past Surgical History:  Procedure Laterality Date   CATARACT EXTRACTION, BILATERAL  5 yrs ago   COLONOSCOPY  05/28/2011   Procedure: COLONOSCOPY;  Surgeon: [name], MD;  Location: [facility];  Service: Endoscopy;  Laterality: N/A;   COLONOSCOPY N/A 07/31/2021   Procedure: COLONOSCOPY;  Surgeon: [name], MD;  Location: [facility];  Service: Endoscopy;  Laterality: N/A;  13:00   CYSTECTOMY     back & leg   INGUINAL HERNIA REPAIR  07/01/2012   Procedure: HERNIA REPAIR INGUINAL ADULT;  Surgeon: [name], MD;  Location: [facility];  Service: General;  Laterality: Right;  Right Inguinal Herniorraphy with Mesh   Nesbit  Plication     POLYPECTOMY  07/31/2021   Procedure: POLYPECTOMY;  Surgeon: [name], MD;  Location: [facility];  Service: Endoscopy;;   TONSILLECTOMY      Social History   Socioeconomic History   Marital status: Married    Spouse name: Not  on file   Number of  children: Not on file   Years of education: Not on file   Highest education level: Not on file  Occupational History   Not on file  Tobacco Use   Smoking status: Former    Types: Cigarettes    Quit date: 02/24/1978    Years since quitting: 43.5   Smokeless tobacco: Never  Vaping Use   Vaping Use: Never used  Substance and Sexual Activity   Alcohol use: Yes   Drug use: No   Sexual activity: Yes  Other Topics Concern   Not on file  Social History Narrative   Not on file   Social Determinants of Health   Financial Resource Strain: Not on file  Food Insecurity: Not on file  Transportation Needs: Not on file  Physical Activity: Not on file  Stress: Not on file  Social Connections: Not on file  Intimate Partner Violence: Not on file    History reviewed. No pertinent family history.     Objective: Vitals:   08/21/21 0918  BP: (!) 150/65  Pulse: 81  Temp: 98.4 F (36.9 C)     Physical Exam  Lab Results:  No results found for this or any previous visit (from the past 24 hour(s)).   BMET No results for input(s): NA, K, CL, CO2, GLUCOSE, BUN, CREATININE, CALCIUM in the last 72 hours. PSA  09/30/20: 6.4.  01/07/21:   7.7 with a 21.6% f/t ratio.  01/14/21   8.0  with 19% f/t ratio.   Assessment & Plan:   Prostate cancer.   He will remain on surveillance as his PSA is stable.  I will have him return in 6 months with a PSA and MRIP.    BPH with BOO.   Continue tamsulosin.   I did discuss finasteride and minimally invasive treatment options.   ED. He didn't respond to oral meds and is off of the tadalafil.    No orders of the defined types were placed in this encounter.    Orders Placed This Encounter  Procedures   MR PROSTATE W WO CONTRAST    Standing Status:   Future    Standing Expiration Date:   08/21/2022    Order Specific Question:   If indicated for the ordered procedure, I authorize the administration of contrast media per Radiology protocol     Answer:   Yes    Order Specific Question:   What is the patient's sedation requirement?    Answer:   No Sedation    Order Specific Question:   Does the patient have a pacemaker or implanted devices?    Answer:   No    Order Specific Question:   Radiology Contrast Protocol - do NOT remove file path    Answer:   [email or web address]    Order Specific Question:   Preferred imaging location?    Answer:   [facility] (table limit-550lbs)   Urinalysis, Routine w reflex microscopic   PSA    Standing Status:   Future    Standing Expiration Date:   08/21/2022      Return in about 6 months (around 02/18/2022) for with PSA and MRIP.Marland Kitchen   CC: Sharilyn Sites, MD      Irine Seal 08/21/2021 Patient ID: Matthew Payne, male   DOB: [birth year], 75 y.o.   MRN: [id number]

## 2021-08-21 NOTE — Progress Notes (Signed)
Urological Symptom Review  Patient is experiencing the following symptoms: Frequent urination Get up at night to urinate   Review of Systems  Gastrointestinal (upper)  : Negative for upper GI symptoms  Gastrointestinal (lower) : Negative for lower GI symptoms  Constitutional : Night Sweats  Skin: Negative for skin symptoms  Eyes: Negative for eye symptoms  Ear/Nose/Throat : Negative for Ear/Nose/Throat symptoms  Hematologic/Lymphatic: Negative for Hematologic/Lymphatic symptoms  Cardiovascular : Negative for cardiovascular symptoms  Respiratory : Negative for respiratory symptoms  Endocrine: Negative for endocrine symptoms  Musculoskeletal: Negative for musculoskeletal symptoms  Neurological: Negative for neurological symptoms  Psychologic: Negative for psychiatric symptoms

## 2021-08-25 DIAGNOSIS — Z08 Encounter for follow-up examination after completed treatment for malignant neoplasm: Secondary | ICD-10-CM | POA: Diagnosis not present

## 2021-08-25 DIAGNOSIS — L821 Other seborrheic keratosis: Secondary | ICD-10-CM | POA: Diagnosis not present

## 2021-08-25 DIAGNOSIS — L820 Inflamed seborrheic keratosis: Secondary | ICD-10-CM | POA: Diagnosis not present

## 2021-08-25 DIAGNOSIS — L570 Actinic keratosis: Secondary | ICD-10-CM | POA: Diagnosis not present

## 2021-08-25 DIAGNOSIS — C4322 Malignant melanoma of left ear and external auricular canal: Secondary | ICD-10-CM | POA: Diagnosis not present

## 2021-08-25 DIAGNOSIS — Z85820 Personal history of malignant melanoma of skin: Secondary | ICD-10-CM | POA: Diagnosis not present

## 2021-09-12 DIAGNOSIS — Z23 Encounter for immunization: Secondary | ICD-10-CM | POA: Diagnosis not present

## 2021-09-16 DIAGNOSIS — C439 Malignant melanoma of skin, unspecified: Secondary | ICD-10-CM | POA: Diagnosis not present

## 2021-09-16 DIAGNOSIS — L989 Disorder of the skin and subcutaneous tissue, unspecified: Secondary | ICD-10-CM | POA: Diagnosis not present

## 2021-09-16 DIAGNOSIS — Z481 Encounter for planned postprocedural wound closure: Secondary | ICD-10-CM | POA: Diagnosis not present

## 2021-09-16 DIAGNOSIS — C4322 Malignant melanoma of left ear and external auricular canal: Secondary | ICD-10-CM | POA: Diagnosis not present

## 2021-09-19 DIAGNOSIS — C439 Malignant melanoma of skin, unspecified: Secondary | ICD-10-CM | POA: Diagnosis not present

## 2021-09-19 DIAGNOSIS — C4322 Malignant melanoma of left ear and external auricular canal: Secondary | ICD-10-CM | POA: Diagnosis not present

## 2021-10-08 DIAGNOSIS — L905 Scar conditions and fibrosis of skin: Secondary | ICD-10-CM | POA: Diagnosis not present

## 2021-10-08 DIAGNOSIS — Z481 Encounter for planned postprocedural wound closure: Secondary | ICD-10-CM | POA: Diagnosis not present

## 2021-10-08 DIAGNOSIS — C4322 Malignant melanoma of left ear and external auricular canal: Secondary | ICD-10-CM | POA: Diagnosis not present

## 2021-10-14 DIAGNOSIS — Z1389 Encounter for screening for other disorder: Secondary | ICD-10-CM | POA: Diagnosis not present

## 2021-10-14 DIAGNOSIS — I10 Essential (primary) hypertension: Secondary | ICD-10-CM | POA: Diagnosis not present

## 2021-10-14 DIAGNOSIS — Z6824 Body mass index (BMI) 24.0-24.9, adult: Secondary | ICD-10-CM | POA: Diagnosis not present

## 2021-10-14 DIAGNOSIS — C61 Malignant neoplasm of prostate: Secondary | ICD-10-CM | POA: Diagnosis not present

## 2021-10-14 DIAGNOSIS — R7309 Other abnormal glucose: Secondary | ICD-10-CM | POA: Diagnosis not present

## 2021-10-14 DIAGNOSIS — E782 Mixed hyperlipidemia: Secondary | ICD-10-CM | POA: Diagnosis not present

## 2021-10-14 DIAGNOSIS — Z0001 Encounter for general adult medical examination with abnormal findings: Secondary | ICD-10-CM | POA: Diagnosis not present

## 2021-10-14 DIAGNOSIS — E663 Overweight: Secondary | ICD-10-CM | POA: Diagnosis not present

## 2021-11-25 DIAGNOSIS — D2272 Melanocytic nevi of left lower limb, including hip: Secondary | ICD-10-CM | POA: Diagnosis not present

## 2021-11-25 DIAGNOSIS — D485 Neoplasm of uncertain behavior of skin: Secondary | ICD-10-CM | POA: Diagnosis not present

## 2021-11-25 DIAGNOSIS — Z85820 Personal history of malignant melanoma of skin: Secondary | ICD-10-CM | POA: Diagnosis not present

## 2021-11-25 DIAGNOSIS — Z1283 Encounter for screening for malignant neoplasm of skin: Secondary | ICD-10-CM | POA: Diagnosis not present

## 2021-11-25 DIAGNOSIS — D225 Melanocytic nevi of trunk: Secondary | ICD-10-CM | POA: Diagnosis not present

## 2021-11-25 DIAGNOSIS — Z08 Encounter for follow-up examination after completed treatment for malignant neoplasm: Secondary | ICD-10-CM | POA: Diagnosis not present

## 2022-01-04 IMAGING — MR MR PROSTATE WO/W CM
12 series · 48 of 48 positions shown · IV contrast (17 ml Multihance)
Comparison: 02/20/2019 common prostate MRI

CLINICAL DATA: 74-year-old male with elevated prostate specific
antigen equal 10.8.

EXAM:
MR PROSTATE WITHOUT AND WITH CONTRAST
TECHNIQUE: Multiplanar multisequence MRI images were obtained of the pelvis
centered about the prostate. Pre and post contrast images were
obtained.
CONTRAST:  16mL MULTIHANCE GADOBENATE DIMEGLUMINE 529 MG/ML IV SOLN

[Series 3: T2 · coronal · 3.0mm · 0.70mm/px · 1 of 28 slices shown (1 of 3)]
[im 1/28]
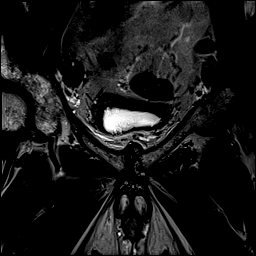

[Series 4: T1 · axial · 5.0mm · 1.25mm/px · z∈[-17,+238]mm · 2 of 104 slices shown]
[im 1/104]
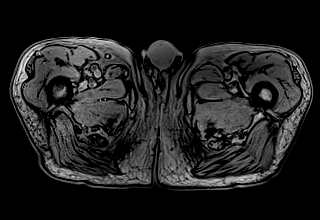
[im 104/104]
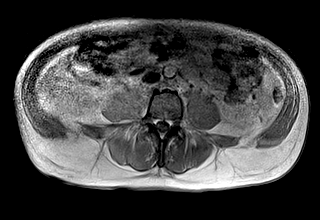

[Series 5: DWI · axial · 3.0mm · 1.75mm/px · z∈[+17,+104]mm · 2 of 90 slices shown (1 of 3)]
[im 1/90]
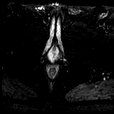
[im 90/90]
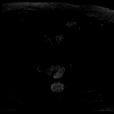

[Series 6: DWI · axial · 3.0mm · 1.75mm/px · 1 of 30 slices shown (2 of 3)]
[im 1/30]
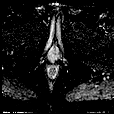

[Series 7: DWI · axial · 3.0mm · 1.75mm/px · 1 of 30 slices shown (3 of 3)]
[im 1/30]
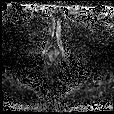

[Series 8: T2 · axial · 3.0mm · 0.70mm/px · 1 of 30 slices shown (2 of 3)]
[im 1/30]
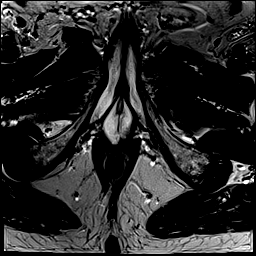

[Series 9: T2 · axial · 1.0mm · 1.04mm/px · z∈[+21,+100]mm · 2 of 80 slices shown (3 of 3)]
[im 1/80]
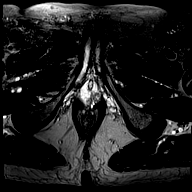
[im 80/80]
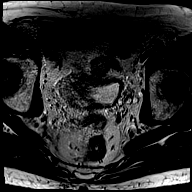

[Series 10: pre t1_twist_tra_dyn · axial · non-contrast · 3.5mm · 0.83mm/px · 1 of 24 slices shown]
[im 1/24]
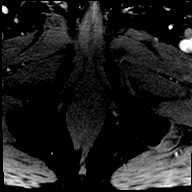

[Series 11: post t1_twist_tra_dyn-copy center · axial · non-contrast · 3.5mm · 0.83mm/px · z∈[+20,+101]mm · 17 of 720 slices shown]
[im 1/720]
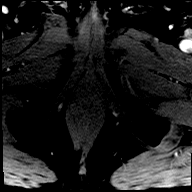
[im 45/720]
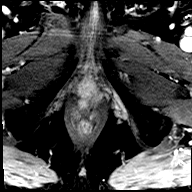
[im 90/720]
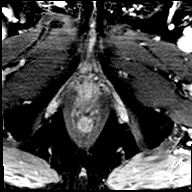
[im 135/720]
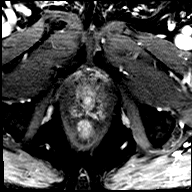
[im 180/720]
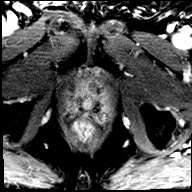
[im 225/720]
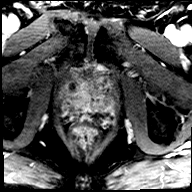
[im 270/720]
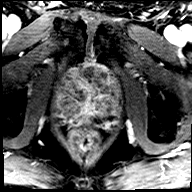
[im 315/720]
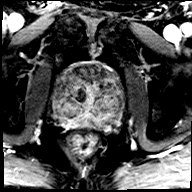
[im 360/720]
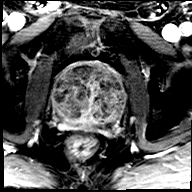
[im 405/720]
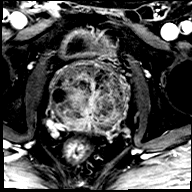
[im 450/720]
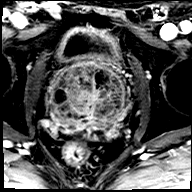
[im 495/720]
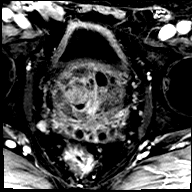
[im 540/720]
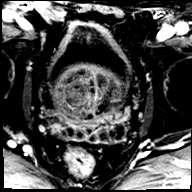
[im 585/720]
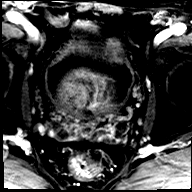
[im 630/720]
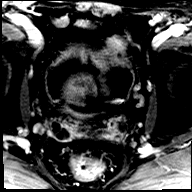
[im 675/720]
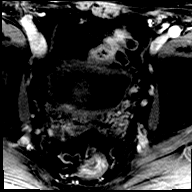
[im 720/720]
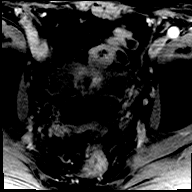

[Series 12: post t1_twist_tra_dyn-copy cent_sub · axial · 3.5mm · 0.83mm/px · z∈[+20,+101]mm · 16 of 696 slices shown]
[im 1/696]
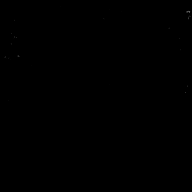
[im 47/696]
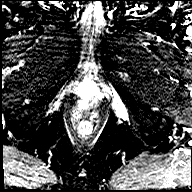
[im 93/696]
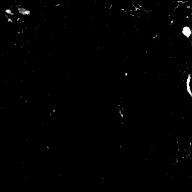
[im 140/696]
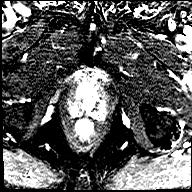
[im 186/696]
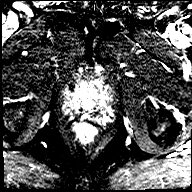
[im 232/696]
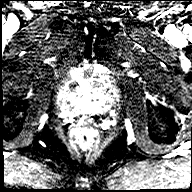
[im 279/696]
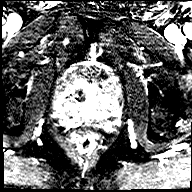
[im 325/696]
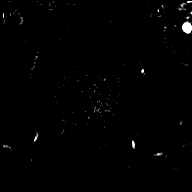
[im 371/696]
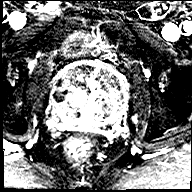
[im 418/696]
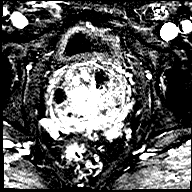
[im 464/696]
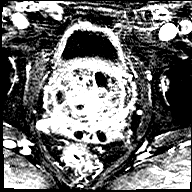
[im 510/696]
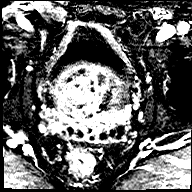
[im 557/696]
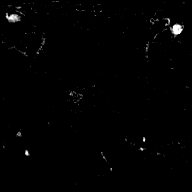
[im 603/696]
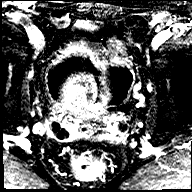
[im 649/696]
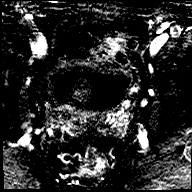
[im 696/696]
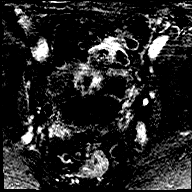

[Series 13: t1_vibe_dixon_tra_f · axial · 2.5mm · 1.14mm/px · z∈[-19,+239]mm · 2 of 104 slices shown]
[im 1/104]
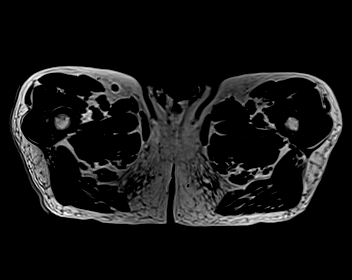
[im 104/104]
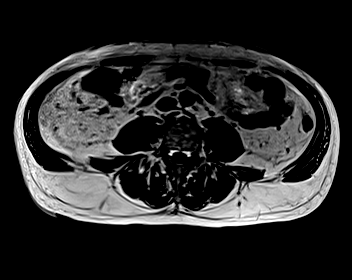

[Series 14: t1_vibe_dixon_tra_w · axial · 2.5mm · 1.14mm/px · z∈[-19,+239]mm · 2 of 104 slices shown]
[im 1/104]
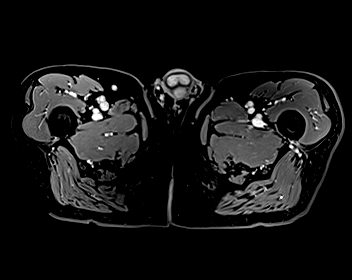
[im 104/104]
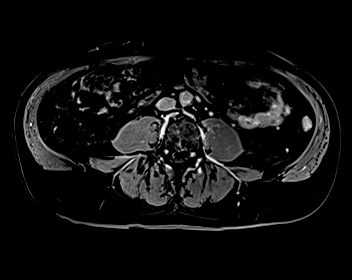

[48 of 48 positions shown; findings below may reference images not displayed]

FINDINGS: Prostate: No foci of restricted diffusion within the peripheral zone
(series 17).

No focal lesion identified in the peripheral zone on T2 weighted
imaging (series 8). No abnormal enhancement pattern within the
peripheral zone.

The transitional zone is enlarged by multiple well capsulated
nodules without suspicious imaging characteristics on T2 weighted
imaging (series 8).

Seminal vesicles are normal.  Prostatic capsule is intact

Volume: 6.1 x 6.1 x 5.4 (volume = 110)

Transcapsular spread:  Absent

Seminal vesicle involvement: Absent

Neurovascular bundle involvement: Absent

Pelvic adenopathy: Absent

Bone metastasis: Absent

Other findings: None
IMPRESSION: 1. No high-grade carcinoma identified in the peripheral zone.
PI-RADS: 1.
2. Enlarged nodular transitional zone most consistent benign
prostate hypertrophy. PI-RADS: 2

## 2022-02-12 ENCOUNTER — Other Ambulatory Visit: Payer: Medicare Other

## 2022-02-13 ENCOUNTER — Other Ambulatory Visit: Payer: Medicare Other

## 2022-02-13 DIAGNOSIS — C61 Malignant neoplasm of prostate: Secondary | ICD-10-CM | POA: Diagnosis not present

## 2022-02-14 LAB — PSA: Prostate Specific Ag, Serum: 10.5 ng/mL — ABNORMAL HIGH (ref 0.0–4.0)

## 2022-02-16 ENCOUNTER — Ambulatory Visit
Admission: RE | Admit: 2022-02-16 | Discharge: 2022-02-16 | Disposition: A | Discharge status: Home / Self Care | Payer: Medicare Other | Source: Ambulatory Visit | Attending: Urology | Admitting: Urology

## 2022-02-16 DIAGNOSIS — K5730 Diverticulosis of large intestine without perforation or abscess without bleeding: Secondary | ICD-10-CM | POA: Diagnosis not present

## 2022-02-16 DIAGNOSIS — C61 Malignant neoplasm of prostate: Secondary | ICD-10-CM

## 2022-02-16 DIAGNOSIS — K4090 Unilateral inguinal hernia, without obstruction or gangrene, not specified as recurrent: Secondary | ICD-10-CM | POA: Diagnosis not present

## 2022-02-16 MED ORDER — GADOBENATE DIMEGLUMINE 529 MG/ML IV SOLN
17.0000 mL | Freq: Once | INTRAVENOUS | Status: AC | PRN
  Administered 2022-02-16: 17 mL via INTRAVENOUS

## 2022-02-19 ENCOUNTER — Encounter: Payer: Self-pay | Admitting: Urology

## 2022-02-19 ENCOUNTER — Ambulatory Visit (INDEPENDENT_AMBULATORY_CARE_PROVIDER_SITE_OTHER): Payer: Medicare Other | Admitting: Urology

## 2022-02-19 VITALS — BP 160/75 | HR 77

## 2022-02-19 DIAGNOSIS — R3912 Poor urinary stream: Secondary | ICD-10-CM | POA: Diagnosis not present

## 2022-02-19 DIAGNOSIS — C61 Malignant neoplasm of prostate: Secondary | ICD-10-CM

## 2022-02-19 DIAGNOSIS — R972 Elevated prostate specific antigen [PSA]: Secondary | ICD-10-CM | POA: Diagnosis not present

## 2022-02-19 DIAGNOSIS — N403 Nodular prostate with lower urinary tract symptoms: Secondary | ICD-10-CM

## 2022-02-19 DIAGNOSIS — R3915 Urgency of urination: Secondary | ICD-10-CM | POA: Diagnosis not present

## 2022-02-19 DIAGNOSIS — N5201 Erectile dysfunction due to arterial insufficiency: Secondary | ICD-10-CM

## 2022-02-19 LAB — URINALYSIS, ROUTINE W REFLEX MICROSCOPIC
Bilirubin, UA: NEGATIVE
Glucose, UA: NEGATIVE
Ketones, UA: NEGATIVE
Leukocytes,UA: NEGATIVE
Nitrite, UA: NEGATIVE
Protein,UA: NEGATIVE
RBC, UA: NEGATIVE
Specific Gravity, UA: >1.03 — ABNORMAL HIGH (ref 1.005–1.030)
Urobilinogen, Ur: 0.2 mg/dL (ref 0.2–1.0)
pH, UA: 5.5 (ref 5.0–7.5)

## 2022-02-19 MED ORDER — FINASTERIDE 5 MG PO TABS: 5.0000 mg | ORAL_TABLET | Freq: Every day | ORAL | 3 refills | Status: DC

## 2022-02-19 MED ORDER — TADALAFIL 5 MG PO TABS: ORAL_TABLET | ORAL | 5 refills | Status: DC

## 2022-02-19 NOTE — Progress Notes (Signed)
?Subjective: ? ?1. Prostate cancer ([place])   ?2. Elevated PSA   ?3. Nodular prostate with lower urinary tract symptoms   ?4. Urgency of urination   ?5. Weak urinary stream   ?6. Erectile dysfunction due to arterial insufficiency   ?  ?02/19/22: Matthew Payne returns today in f/u.  He had a repeat MRIP on 02/16/22 that showed a 176m prostate with no high risk lesions.  He remains on AS for low risk prostate cancer that was diagnosed on 01/30/21.  His PSA is 10.5 which is up from 8.4 in 10/22 but it was 10.8 on 05/22/21.   He is voiding better with reduced nocturia. He has some urgency.  He has no dysuria or hematuria.   His IPSS is 15.  He has some issues with urgency but minimal nocturia.  He remains on tamsulosin but is off of the tadalafil.   He was having some dizziness.  He wasn't responding to the tadalafil.  He was given sildenafil in 8/22 but didn't have a response either.   ? ?02/14/21: [name] today in f/u to discuss his prostate biopsy results.   He was found to have a 48 ml prostate with a single core with 5% Gleason 6 prostate cancer with PNI.  There was another core with atypia at the left apex.   He has T2a Nx Mx disease.  He has preexisting ED and BOO.  ? ?He had an MRIP in 5/20 that showed an 8108mprostate with no suspicious lesions. He does have a middle lobe that is primarily on the right on MRI.    ? ?GU Hx: Matthew Payne a 89o WM who is sent by Dr. GoHilma Favorsor an elevated PSA of 4.1. His PSA has been rising steadily for the last few years and was 1.58 in 2011. He had a biopsy for a nodule about 15 years ago. He has moderate LUTS with an IPSS of 11. He is currently on tadalafil which was started about 2 months ago and has helped his symptoms. He has had no hematuria or dysuria. He had a foley in 7/19 when hospitalized and [facility] with acute pancreatitis possibly from alcohol. He had mild pyuria but a negative culture. He stopped drinking after that. He has a history of Peyronie's disease and had a  phalloplasty in 2008 by Dr. OtKarsten Payne ? ? IPSS   ? ? Ro[place]intoame 02/19/22 1000  ?  ?  ?  ? International Prostate Symptom Score  ? How often have you had the sensation of not emptying your bladder? About half the time    ? How often have you had to urinate less than every two hours? About half the time    ? How often have you found you stopped and started again several times when you urinated? Less than half the time    ? How often have you found it difficult to postpone urination? About half the time    ? How often have you had a weak urinary stream? More than half the time    ? How often have you had to strain to start urination? Not at All    ? How many times did you typically get up at night to urinate? None    ? Total IPSS Score 15    ?  ? Quality of Life due to urinary symptoms  ? If you were to spend the rest of your life with your urinary condition just the way it  is now how would you feel about that? Mixed    ? ?  ?  ? ?  ? ? ?ROS: ? ?ROS:  ?A complete review of systems was performed.  All systems are negative except for pertinent findings as noted.  ? ?Review of Systems  ?Neurological:  Positive for dizziness.  ?All other systems reviewed and are negative. ? ?No Known Allergies ? ?Outpatient Encounter Medications as of 02/19/2022  ?Medication Sig  ? enalapril (VASOTEC) 10 MG tablet Take 10 mg by mouth daily.  ? finasteride (PROSCAR) 5 MG tablet Take 1 tablet (5 mg total) by mouth daily.  ? pravastatin (PRAVACHOL) 40 MG tablet Take 40 mg by mouth daily.  ? tadalafil (CIALIS) 5 MG tablet Take up to 4 tablets po as needed daily or up to 2 tablets daily po with 2-3 '20mg'$  sildenafil as needed for sexual activity.  Do not take within 4 hours of tamsulosin/flomax.  ? tamsulosin (FLOMAX) 0.4 MG CAPS capsule Take 0.4 mg by mouth daily.  ? zolpidem (AMBIEN) 10 MG tablet Take by mouth.  ? Bioflavonoid Products (ESTER C PO) Take 1,000 mg by mouth daily.  ? ?No facility-administered encounter medications on file as of  02/19/2022.  ? ? ?Past Medical History:  ?Diagnosis Date  ? Hypercholesteremia   ? Hypertension   ? ? ?Past Surgical History:  ?Procedure Laterality Date  ? CATARACT EXTRACTION, BILATERAL  5 yrs ago  ? COLONOSCOPY  05/28/2011  ? Procedure: COLONOSCOPY;  Surgeon: Matthew Houston, MD;  [facility];  Service: Endoscopy;  Laterality: N/A;  ? COLONOSCOPY N/A 07/31/2021  ? Procedure: COLONOSCOPY;  Surgeon: Matthew Houston, MD;  [facility];  Service: Endoscopy;  Laterality: N/A;  13:00  ? CYSTECTOMY    ? back & leg  ? INGUINAL HERNIA REPAIR  07/01/2012  ? Procedure: HERNIA REPAIR INGUINAL ADULT;  Surgeon: Matthew So, MD;  Location: [facility];  Service: General;  Laterality: Right;  Right Inguinal Herniorraphy with Mesh  ? Nesbit  Plication    ? POLYPECTOMY  07/31/2021  ? Procedure: POLYPECTOMY;  Surgeon: Matthew Houston, MD;  [facility];  Service: Endoscopy;;  ? TONSILLECTOMY    ? ? ?Social History  ? ?Socioeconomic History  ? Marital status: Married  ?  Spouse name: Not on file  ? Number of children: Not on file  ? Years of education: Not on file  ? Highest education level: Not on file  ?Occupational History  ? Not on file  ?Tobacco Use  ? Smoking status: Former  ?  Types: Cigarettes  ?  Quit date: 02/24/1978  ?  Years since quitting: 44.0  ? Smokeless tobacco: Never  ?Vaping Use  ? Vaping Use: Never used  ?Substance and Sexual Activity  ? Alcohol use: Yes  ? Drug use: No  ? Sexual activity: Yes  ?Other Topics Concern  ? Not on file  ?Social History Narrative  ? Not on file  ? ?Social Determinants of Health  ? ?Financial Resource Strain: Not on file  ?Food Insecurity: Not on file  ?Transportation Needs: Not on file  ?Physical Activity: Not on file  ?Stress: Not on file  ?Social Connections: Not on file  ?Intimate Partner Violence: Not on file  ? ? ?No family history on file. ? ? ? ? ?Objective: ?Vitals:  ? 02/19/22 0941  ?BP: (!) 160/75  ?Pulse: 77  ? ? ? ?Physical Exam ?Vitals reviewed.   ?Constitutional:   ?  Appearance: Normal appearance.  ?Neurological:  ?   Mental Status: He is alert.  ? ? ?Lab Results:  ?Results for orders placed or performed in visit on 02/19/22 (from the past 24 hour(s))  ?Urinalysis, Routine w reflex microscopic     Status: Abnormal  ? Collection Time: 02/19/22  9:45 AM  ?Result Value Ref Range  ? Specific Gravity, UA >1.030 (H) 1.005 - 1.030  ? pH, UA 5.5 5.0 - 7.5  ? Color, UA Yellow Yellow  ? Appearance Ur Clear Clear  ? Leukocytes,UA Negative Negative  ? Protein,UA Negative Negative/Trace  ? Glucose, UA Negative Negative  ? Ketones, UA Negative Negative  ? RBC, UA Negative Negative  ? Bilirubin, UA Negative Negative  ? Urobilinogen, Ur 0.2 0.2 - 1.0 mg/dL  ? Nitrite, UA Negative Negative  ? Microscopic Examination Comment   ? Narrative  ? Performed at:  [facility] ?[address] [place], [place]  [id number] ?Lab Director: [place], Phone:  [phone]  ? ?  ?Lab Results  ?Component Value Date  ? PSA1 10.5 (H) 02/13/2022  ? PSA1 8.4 (H) 08/12/2021  ? PSA1 10.8 (H) 05/22/2021  ? ? ?BMET ?No results for input(s): NA, K, CL, CO2, GLUCOSE, BUN, CREATININE, CALCIUM in the last 72 hours. ?PSA ? ?09/30/20: 6.4.  ?01/07/21:   7.7 with a 21.6% f/t ratio.  ?01/14/21   8.0  with 19% f/t ratio.  ?MR PROSTATE W WO CONTRAST ? ?Result Date: 02/16/2022 ?CLINICAL DATA:  Prostate carcinoma, Gleason score 6. Active surveillance. EXAM: MR PROSTATE WITHOUT AND WITH CONTRAST TECHNIQUE: Multiplanar multisequence MRI images were obtained of the pelvis centered about the prostate. Pre and post contrast images were obtained. CONTRAST:  3m MULTIHANCE GADOBENATE DIMEGLUMINE 529 MG/ML IV SOLN COMPARISON:  06/17/2021 FINDINGS: Prostate: -- Peripheral Zone: Linear/wedge shaped hypointensities are noted on ADC; however, no focal ADC hypointense or high b-value DWI hyperintense nodules are identified. -- Transition/Central Zone: Moderately enlarged with diffuse involvement by BPH  nodules. No nodules with suspicious characteristics on T2-weighted imaging. -- Measurements/Volume:  6.5 by 5.6 x 6.4 cm (volume = 120 cm^3) Transcapsular spread:  Absent Seminal vesicle involvement:  Absent Neurova

## 2022-03-02 ENCOUNTER — Other Ambulatory Visit: Payer: Self-pay

## 2022-03-02 MED ORDER — TADALAFIL 5 MG PO TABS: ORAL_TABLET | ORAL | 5 refills | Status: DC

## 2022-04-20 DIAGNOSIS — R7309 Other abnormal glucose: Secondary | ICD-10-CM | POA: Diagnosis not present

## 2022-04-20 DIAGNOSIS — E663 Overweight: Secondary | ICD-10-CM | POA: Diagnosis not present

## 2022-04-20 DIAGNOSIS — E782 Mixed hyperlipidemia: Secondary | ICD-10-CM | POA: Diagnosis not present

## 2022-04-20 DIAGNOSIS — M25512 Pain in left shoulder: Secondary | ICD-10-CM | POA: Diagnosis not present

## 2022-04-20 DIAGNOSIS — Z6825 Body mass index (BMI) 25.0-25.9, adult: Secondary | ICD-10-CM | POA: Diagnosis not present

## 2022-04-20 DIAGNOSIS — G8929 Other chronic pain: Secondary | ICD-10-CM | POA: Diagnosis not present

## 2022-04-20 DIAGNOSIS — E7849 Other hyperlipidemia: Secondary | ICD-10-CM | POA: Diagnosis not present

## 2022-04-20 DIAGNOSIS — C61 Malignant neoplasm of prostate: Secondary | ICD-10-CM | POA: Diagnosis not present

## 2022-04-20 DIAGNOSIS — I10 Essential (primary) hypertension: Secondary | ICD-10-CM | POA: Diagnosis not present

## 2022-05-22 ENCOUNTER — Other Ambulatory Visit: Payer: Medicare Other

## 2022-05-22 DIAGNOSIS — C61 Malignant neoplasm of prostate: Secondary | ICD-10-CM | POA: Diagnosis not present

## 2022-05-23 LAB — PSA: Prostate Specific Ag, Serum: 5.9 ng/mL — ABNORMAL HIGH (ref 0.0–4.0)

## 2022-05-28 ENCOUNTER — Ambulatory Visit (INDEPENDENT_AMBULATORY_CARE_PROVIDER_SITE_OTHER): Payer: Medicare Other | Admitting: Urology

## 2022-05-28 VITALS — BP 124/63 | HR 74

## 2022-05-28 DIAGNOSIS — C61 Malignant neoplasm of prostate: Secondary | ICD-10-CM

## 2022-05-28 DIAGNOSIS — Z8546 Personal history of malignant neoplasm of prostate: Secondary | ICD-10-CM | POA: Diagnosis not present

## 2022-05-28 DIAGNOSIS — R3915 Urgency of urination: Secondary | ICD-10-CM | POA: Diagnosis not present

## 2022-05-28 DIAGNOSIS — N5201 Erectile dysfunction due to arterial insufficiency: Secondary | ICD-10-CM | POA: Diagnosis not present

## 2022-05-28 DIAGNOSIS — N403 Nodular prostate with lower urinary tract symptoms: Secondary | ICD-10-CM | POA: Diagnosis not present

## 2022-05-28 DIAGNOSIS — R972 Elevated prostate specific antigen [PSA]: Secondary | ICD-10-CM | POA: Diagnosis not present

## 2022-05-28 DIAGNOSIS — N401 Enlarged prostate with lower urinary tract symptoms: Secondary | ICD-10-CM

## 2022-05-28 DIAGNOSIS — N138 Other obstructive and reflux uropathy: Secondary | ICD-10-CM

## 2022-05-28 DIAGNOSIS — R3912 Poor urinary stream: Secondary | ICD-10-CM

## 2022-05-28 LAB — URINALYSIS, ROUTINE W REFLEX MICROSCOPIC
Bilirubin, UA: NEGATIVE
Glucose, UA: NEGATIVE
Leukocytes,UA: NEGATIVE
Nitrite, UA: NEGATIVE
Protein,UA: NEGATIVE
RBC, UA: NEGATIVE
Specific Gravity, UA: 1.02 (ref 1.005–1.030)
Urobilinogen, Ur: 1 mg/dL (ref 0.2–1.0)
pH, UA: 5.5 (ref 5.0–7.5)

## 2022-05-28 MED ORDER — TAMSULOSIN HCL 0.4 MG PO CAPS: 0.8000 mg | ORAL_CAPSULE | Freq: Every day | ORAL | 3 refills | Status: DC

## 2022-05-28 NOTE — Progress Notes (Signed)
Subjective:  1. Prostate cancer ([name])   2. Elevated PSA   3. Nodular prostate with lower urinary tract symptoms   4. Urgency of urination   5. Weak urinary stream   6. Erectile dysfunction due to arterial insufficiency     05/28/22: Matthew Payne returns today in f/u for history of low risk prostate cancer on AS.  His prostate was 161m on his last MRIP.  His PSA is down to 5.9 with the addition of finasteride on 02/19/22.  He remains on tamsulosin as well.  His IPSS is 11 with reduced nocturia but he reports progressive ED since starting it. He was benefiting from sildenafil and tadalafil but those aren't working now.    02/19/22: TKalupreturns today in f/u.  He had a repeat MRIP on 02/16/22 that showed a 1233mprostate with no high risk lesions.  He remains on AS for low risk prostate cancer that was diagnosed on 01/30/21.  His PSA is 10.5 which is up from 8.4 in 10/22 but it was 10.8 on 05/22/21.   He is voiding better with reduced nocturia. He has some urgency.  He has no dysuria or hematuria.   His IPSS is 15.  He has some issues with urgency but minimal nocturia.  He remains on tamsulosin but is off of the tadalafil.   He was having some dizziness.  He wasn't responding to the tadalafil.  He was given sildenafil in 8/22 but didn't have a response either.    02/14/21: ThBlesseturns today in f/u to discuss his prostate biopsy results.   He was found to have a 48 ml prostate with a single core with 5% Gleason 6 prostate cancer with PNI.  There was another core with atypia at the left apex.   He has T2a Nx Mx disease.  He has preexisting ED and BOO.   He had an MRIP in 5/20 that showed an 8034mrostate with no suspicious lesions. He does have a middle lobe that is primarily on the right on MRI.     GU Hx: Matthew Payne a [age] WM who is sent by [name] an elevated PSA of 4.1. His PSA has been rising steadily for the last few years and was 1.58 in 2011. He had a biopsy for a nodule about 15 years ago.  He has moderate LUTS with an IPSS of 11. He is currently on tadalafil which was started about 2 months ago and has helped his symptoms. He has had no hematuria or dysuria. He had a foley in 7/19 when hospitalized and AP with acute pancreatitis possibly from alcohol. He had mild pyuria but a negative culture. He stopped drinking after that. He has a history of Peyronie's disease and had a phalloplasty in 2008 by [name]    IPSS     Row Name 05/28/22 1400         International Prostate Symptom Payne   How often have you had the sensation of not emptying your bladder? Less than half the time     How often have you had to urinate less than every two hours? Less than half the time     How often have you found you stopped and started again several times when you urinated? Less than 1 in 5 times     How often have you found it difficult to postpone urination? Less than half the time     How often have you had a weak  urinary stream? Less than half the time     How often have you had to strain to start urination? Less than 1 in 5 times     How many times did you typically get up at night to urinate? 1 Time     Total IPSS Payne 11       Quality of Life due to urinary symptoms   If you were to spend the rest of your life with your urinary condition just the way it is now how would you feel about that? Unhappy                ROS:  ROS:  A complete review of systems was performed.  All systems are negative except for pertinent findings as noted.   Review of Systems  Musculoskeletal:  Positive for back pain and joint pain.  Endo/Heme/Allergies:  Bruises/bleeds easily.  All other systems reviewed and are negative.   No Known Allergies  Outpatient Encounter Medications as of 05/28/2022  Medication Sig   Bioflavonoid Products (ESTER C PO) Take 1,000 mg by mouth daily.   enalapril (VASOTEC) 10 MG tablet Take 10 mg by mouth daily.   pravastatin (PRAVACHOL) 40 MG tablet Take 40 mg  by mouth daily.   tadalafil (CIALIS) 5 MG tablet Take up to 4 tablets po as needed daily or up to 2 tablets daily po with 2-3 '20mg'$  sildenafil as needed for sexual activity.  Do not take within 4 hours of tamsulosin/flomax.   tamsulosin (FLOMAX) 0.4 MG CAPS capsule Take 2 capsules (0.8 mg total) by mouth daily.   zolpidem (AMBIEN) 10 MG tablet Take by mouth.   [DISCONTINUED] finasteride (PROSCAR) 5 MG tablet Take 1 tablet (5 mg total) by mouth daily.   [DISCONTINUED] tamsulosin (FLOMAX) 0.4 MG CAPS capsule Take 0.4 mg by mouth daily.   No facility-administered encounter medications on file as of 05/28/2022.    Past Medical History:  Diagnosis Date   Hypercholesteremia    Hypertension     Past Surgical History:  Procedure Laterality Date   CATARACT EXTRACTION, BILATERAL  5 yrs ago   COLONOSCOPY  05/28/2011   Procedure: COLONOSCOPY;  Surgeon: [name], MD;  [facility];  Service: Endoscopy;  Laterality: N/A;   COLONOSCOPY N/A 07/31/2021   Procedure: COLONOSCOPY;  Surgeon: [name], MD;  [facility];  Service: Endoscopy;  Laterality: N/A;  13:00   CYSTECTOMY     back & leg   INGUINAL HERNIA REPAIR  07/01/2012   Procedure: HERNIA REPAIR INGUINAL ADULT;  Surgeon: [name], MD;  Location: [facility];  Service: General;  Laterality: Right;  Right Inguinal Herniorraphy with Mesh   Nesbit  Plication     POLYPECTOMY  07/31/2021   Procedure: POLYPECTOMY;  Surgeon: [name], MD;  [facility];  Service: Endoscopy;;   TONSILLECTOMY      Social History   Socioeconomic History   Marital status: Married    Spouse name: Not on file   Number of children: Not on file   Years of education: Not on file   Highest education level: Not on file  Occupational History   Not on file  Tobacco Use   Smoking status: Former    Types: Cigarettes    Quit date: 02/24/1978    Years since quitting: 44.2   Smokeless tobacco: Never  Vaping Use   Vaping  Use: Never used  Substance and Sexual Activity   Alcohol  use: Yes   Drug use: No   Sexual activity: Yes  Other Topics Concern   Not on file  Social History Narrative   Not on file   Social Determinants of Health   Financial Resource Strain: Not on file  Food Insecurity: Not on file  Transportation Needs: Not on file  Physical Activity: Not on file  Stress: Not on file  Social Connections: Not on file  Intimate Partner Violence: Not on file    No family history on file.     Objective: Vitals:   05/28/22 1419  BP: 124/63  Pulse: 74     Physical Exam Vitals reviewed.  Constitutional:      Appearance: Normal appearance.  Neurological:     Mental Status: He is alert.     Lab Results:  Results for orders placed or performed in visit on 05/28/22 (from the past 24 hour(s))  Urinalysis, Routine w reflex microscopic     Status: Abnormal   Collection Time: 05/28/22  2:38 PM  Result Value Ref Range   Specific Gravity, UA 1.020 1.005 - 1.030   pH, UA 5.5 5.0 - 7.5   Color, UA Yellow Yellow   Appearance Ur Clear Clear   Leukocytes,UA Negative Negative   Protein,UA Negative Negative/Trace   Glucose, UA Negative Negative   Ketones, UA Trace (A) Negative   RBC, UA Negative Negative   Bilirubin, UA Negative Negative   Urobilinogen, Ur 1.0 0.2 - 1.0 mg/dL   Nitrite, UA Negative Negative   Microscopic Examination Comment    Narrative   Performed at:  [facility] [address] [place], [place]  [id number] Lab Director: Mina Marble [place], Phone:  [phone]      Lab Results  Component Value Date   PSA1 5.9 (H) 05/22/2022   PSA1 10.5 (H) 02/13/2022   PSA1 8.4 (H) 08/12/2021    BMET No results for input(s): "NA", "K", "CL", "CO2", "GLUCOSE", "BUN", "CREATININE", "CALCIUM" in the last 72 hours. PSA  09/30/20: 6.4.  01/07/21:   7.7 with a 21.6% f/t ratio.  01/14/21   8.0  with 19% f/t ratio.  No results found.  Assessment & Plan:   Prostate  cancer.   He will remain on surveillance with the negative MRI.   The PSA is down on finasteride but he is having sexual side effects.  I will stop the med and repeat a PSA in 3 months.  I will consider an ExoDx at that time.  BPH with BOO.   Continue tamsulosin but I will stop the finasteride and increase the tamsulosin to bid.    ED. Worsening on finasteride which I will stop.     Meds ordered this encounter  Medications   tamsulosin (FLOMAX) 0.4 MG CAPS capsule    Sig: Take 2 capsules (0.8 mg total) by mouth daily.    Dispense:  180 capsule    Refill:  3     Orders Placed This Encounter  Procedures   Urinalysis, Routine w reflex microscopic      Return for He should keep f/u in November. .   CC: Sharilyn Sites, MD      Irine Seal 05/29/2022 Patient ID: Matthew Payne, male   DOB: [birth year], 76 y.o.   MRN: [id number]

## 2022-05-29 ENCOUNTER — Encounter: Payer: Self-pay | Admitting: Urology

## 2022-08-10 DIAGNOSIS — Z1283 Encounter for screening for malignant neoplasm of skin: Secondary | ICD-10-CM | POA: Diagnosis not present

## 2022-08-10 DIAGNOSIS — Z85820 Personal history of malignant melanoma of skin: Secondary | ICD-10-CM | POA: Diagnosis not present

## 2022-08-10 DIAGNOSIS — X32XXXD Exposure to sunlight, subsequent encounter: Secondary | ICD-10-CM | POA: Diagnosis not present

## 2022-08-10 DIAGNOSIS — Z08 Encounter for follow-up examination after completed treatment for malignant neoplasm: Secondary | ICD-10-CM | POA: Diagnosis not present

## 2022-08-10 DIAGNOSIS — L570 Actinic keratosis: Secondary | ICD-10-CM | POA: Diagnosis not present

## 2022-08-10 DIAGNOSIS — D225 Melanocytic nevi of trunk: Secondary | ICD-10-CM | POA: Diagnosis not present

## 2022-08-21 ENCOUNTER — Other Ambulatory Visit: Payer: Medicare Other

## 2022-08-21 DIAGNOSIS — Z23 Encounter for immunization: Secondary | ICD-10-CM | POA: Diagnosis not present

## 2022-08-21 DIAGNOSIS — C61 Malignant neoplasm of prostate: Secondary | ICD-10-CM | POA: Diagnosis not present

## 2022-08-22 LAB — PSA: Prostate Specific Ag, Serum: 12.5 ng/mL — ABNORMAL HIGH (ref 0.0–4.0)

## 2022-08-27 ENCOUNTER — Ambulatory Visit (INDEPENDENT_AMBULATORY_CARE_PROVIDER_SITE_OTHER): Payer: Medicare Other | Admitting: Urology

## 2022-08-27 ENCOUNTER — Ambulatory Visit: Payer: Medicare Other | Admitting: Urology

## 2022-08-27 VITALS — BP 144/65 | HR 92

## 2022-08-27 DIAGNOSIS — N5201 Erectile dysfunction due to arterial insufficiency: Secondary | ICD-10-CM

## 2022-08-27 DIAGNOSIS — R3912 Poor urinary stream: Secondary | ICD-10-CM

## 2022-08-27 DIAGNOSIS — N403 Nodular prostate with lower urinary tract symptoms: Secondary | ICD-10-CM | POA: Diagnosis not present

## 2022-08-27 DIAGNOSIS — C61 Malignant neoplasm of prostate: Secondary | ICD-10-CM

## 2022-08-27 DIAGNOSIS — R3915 Urgency of urination: Secondary | ICD-10-CM | POA: Diagnosis not present

## 2022-08-27 DIAGNOSIS — R350 Frequency of micturition: Secondary | ICD-10-CM | POA: Diagnosis not present

## 2022-08-27 DIAGNOSIS — R972 Elevated prostate specific antigen [PSA]: Secondary | ICD-10-CM

## 2022-08-27 MED ORDER — SILDENAFIL CITRATE 20 MG PO TABS: ORAL_TABLET | ORAL | 5 refills | Status: DC

## 2022-08-27 MED ORDER — TADALAFIL 5 MG PO TABS: ORAL_TABLET | ORAL | 5 refills | Status: DC

## 2022-08-27 NOTE — Progress Notes (Signed)
ExoDx [id number] Urine collection Kit   Item# [id number] Kit ID# [id number]  GSXA 1115

## 2022-08-27 NOTE — Progress Notes (Signed)
Subjective:  1. Prostate cancer ([place])   2. Nodular prostate with lower urinary tract symptoms   3. Elevated PSA   4. Urgency of urination   5. Weak urinary stream   6. Urinary frequency   7. Erectile dysfunction due to arterial insufficiency     08/27/22: Matthew Payne returns today in f/u.  He was started on finasteride in May and his PSA fell from 10.5 in 4/23  to 5.9 in 8/23  but he was having sexual side effects and the PSa is now back up to 12.5.  His PSA has been rather labile in the past and he has a 165m prostate on MRI.  He has low risk prostate cancer with a single core of GG1 disease with 5% involvement on his biopsy from 01/30/21.   05/28/22: TAbureturns today in f/u for history of low risk prostate cancer on AS.  His prostate was 1250mon his last MRIP.  His PSA is down to 5.9 with the addition of finasteride on 02/19/22.  He remains on tamsulosin as well.  His IPSS is 11 with reduced nocturia but he reports progressive ED since starting it. He was benefiting from sildenafil and tadalafil but those aren't working now.    02/19/22: Matthew Payne today in f/u.  He had a repeat MRIP on 02/16/22 that showed a 12021mrostate with no high risk lesions.  He remains on AS for low risk prostate cancer that was diagnosed on 01/30/21.  His PSA is 10.5 which is up from 8.4 in 10/22 but it was 10.8 on 05/22/21.   He is voiding better with reduced nocturia. He has some urgency.  He has no dysuria or hematuria.   His IPSS is 15.  He has some issues with urgency but minimal nocturia.  He remains on tamsulosin but is off of the tadalafil.   He was having some dizziness.  He wasn't responding to the tadalafil.  He was given sildenafil in 8/22 but didn't have a response either.    02/14/21: Matthew Payne today in f/u to discuss his prostate biopsy results.   He was found to have a 48 ml prostate with a single core with 5% Gleason 6 prostate cancer with PNI.  There was another core with atypia at the left apex.   He  has T2a Nx Mx disease.  He has preexisting ED and BOO.   He had an MRIP in 5/20 that showed an 14m48mostate with no suspicious lesions. He does have a middle lobe that is primarily on the right on MRI.     GU Hx: Matthew Payne 76 [id number] who is sent by Dr. GoldHilma Favors an elevated PSA of 4.1. His PSA has been rising steadily for the last few years and was 1.58 in 2011. He had a biopsy for a nodule about 15 years ago. He has moderate LUTS with an IPSS of 11. He is currently on tadalafil which was started about 2 months ago and has helped his symptoms. He has had no hematuria or dysuria. He had a foley in 7/19 when hospitalized and [facility] with acute pancreatitis possibly from alcohol. He had mild pyuria but a negative culture. He stopped drinking after that. He has a history of Peyronie's disease and had a phalloplasty in 2008 by Dr. OtteKarsten Ro   IPSS     Row Name 08/27/22 1500         International Prostate Symptom Score   How often  have you had the sensation of not emptying your bladder? Less than 1 in 5     How often have you had to urinate less than every two hours? About half the time     How often have you found you stopped and started again several times when you urinated? Less than 1 in 5 times     How often have you found it difficult to postpone urination? About half the time     How often have you had a weak urinary stream? Less than 1 in 5 times     How often have you had to strain to start urination? Less than 1 in 5 times     How many times did you typically get up at night to urinate? None     Total IPSS Score 10       Quality of Life due to urinary symptoms   If you were to spend the rest of your life with your urinary condition just the way it is now how would you feel about that? Unhappy                 ROS:  ROS:  A complete review of systems was performed.  All systems are negative except for pertinent findings as noted.   Review of Systems  Constitutional:   Negative for weight loss.  Musculoskeletal:  Positive for back pain and joint pain.  All other systems reviewed and are negative.   No Known Allergies  Outpatient Encounter Medications as of 08/27/2022  Medication Sig   Bioflavonoid Products (ESTER C PO) Take 1,000 mg by mouth daily.   enalapril (VASOTEC) 10 MG tablet Take 10 mg by mouth daily.   pravastatin (PRAVACHOL) 40 MG tablet Take 40 mg by mouth daily.   sildenafil (REVATIO) 20 MG tablet Take 2-3 tabs po prn in combination with '10mg'$  tadalafil   tamsulosin (FLOMAX) 0.4 MG CAPS capsule Take 2 capsules (0.8 mg total) by mouth daily.   zolpidem (AMBIEN) 10 MG tablet Take by mouth.   [DISCONTINUED] tadalafil (CIALIS) 5 MG tablet Take up to 4 tablets po as needed daily or up to 2 tablets daily po with 2-3 '20mg'$  sildenafil as needed for sexual activity.  Do not take within 4 hours of tamsulosin/flomax.   tadalafil (CIALIS) 5 MG tablet Take up to 4 tablets po as needed daily or up to 2 tablets daily po with 2-3 '20mg'$  sildenafil as needed for sexual activity.  Do not take within 4 hours of tamsulosin/flomax.   No facility-administered encounter medications on file as of 08/27/2022.    Past Medical History:  Diagnosis Date   Hypercholesteremia    Hypertension     Past Surgical History:  Procedure Laterality Date   CATARACT EXTRACTION, BILATERAL  5 yrs ago   COLONOSCOPY  05/28/2011   Procedure: COLONOSCOPY;  Surgeon: [name], MD;  Location: [facility];  Service: Endoscopy;  Laterality: N/A;   COLONOSCOPY N/A 07/31/2021   Procedure: COLONOSCOPY;  Surgeon: [name], MD;  Location: [facility];  Service: Endoscopy;  Laterality: N/A;  13:00   CYSTECTOMY     back & leg   INGUINAL HERNIA REPAIR  07/01/2012   Procedure: HERNIA REPAIR INGUINAL ADULT;  Surgeon: [name], MD;  Location: [facility];  Service: General;  Laterality: Right;  Right Inguinal Herniorraphy with Mesh   Nesbit  Plication     POLYPECTOMY  07/31/2021    Procedure: POLYPECTOMY;  Surgeon:  [name], MD;  Location: [facility];  Service: Endoscopy;;   TONSILLECTOMY      Social History   Socioeconomic History   Marital status: Married    Spouse name: Not on file   Number of children: Not on file   Years of education: Not on file   Highest education level: Not on file  Occupational History   Not on file  Tobacco Use   Smoking status: Former    Types: Cigarettes    Quit date: 02/24/1978    Years since quitting: 44.5   Smokeless tobacco: Never  Vaping Use   Vaping Use: Never used  Substance and Sexual Activity   Alcohol use: Yes   Drug use: No   Sexual activity: Yes  Other Topics Concern   Not on file  Social History Narrative   Not on file   Social Determinants of Health   Financial Resource Strain: Not on file  Food Insecurity: Not on file  Transportation Needs: Not on file  Physical Activity: Not on file  Stress: Not on file  Social Connections: Not on file  Intimate Partner Violence: Not on file    No family history on file.     Objective: Vitals:   08/27/22 1457  BP: (!) 144/65  Pulse: 92     Physical Exam Vitals reviewed.  Constitutional:      Appearance: Normal appearance.  Genitourinary:    Comments: Prostate is 3+ with an 41m right mid nodule.  Neurological:     Mental Status: He is alert.     Lab Results:  No results found for this or any previous visit (from the past 24 hour(s)).     Lab Results  Component Value Date   PSA1 12.5 (H) 08/21/2022   PSA1 5.9 (H) 05/22/2022   PSA1 10.5 (H) 02/13/2022    BMET No results for input(s): "NA", "K", "CL", "CO2", "GLUCOSE", "BUN", "CREATININE", "CALCIUM" in the last 72 hours. PSA  09/30/20: 6.4.  01/07/21:   7.7 with a 21.6% f/t ratio.  01/14/21   8.0  with 19% f/t ratio.  No results found.  Assessment & Plan:   Prostate cancer.   He will remain on surveillance with the negative MRI but his PSA is back up with stopping  finasteride.  He has a right mid nodule.   I will get a PSA in 3 months and 6 months to get a better idea about the kinetics.  I will also order and ExoDx.   Nodular prostate with BOO.   Continue tamsulosin bid.  ED.  He is doing better with combination tadalafil and sildenafil..     Meds ordered this encounter  Medications   tadalafil (CIALIS) 5 MG tablet    Sig: Take up to 4 tablets po as needed daily or up to 2 tablets daily po with 2-3 '20mg'$  sildenafil as needed for sexual activity.  Do not take within 4 hours of tamsulosin/flomax.    Dispense:  30 tablet    Refill:  5   sildenafil (REVATIO) 20 MG tablet    Sig: Take 2-3 tabs po prn in combination with '10mg'$  tadalafil    Dispense:  30 tablet    Refill:  5     Orders Placed This Encounter  Procedures   Urinalysis, Routine w reflex microscopic   PSA    Standing Status:   Future    Standing Expiration Date:   02/25/2023   PSA    Standing Status:  Future    Standing Expiration Date:   08/28/2023      Return in about 6 months (around 02/25/2023) for PSA in 2-3 months with Dr. Hilma Favors and then in 6 months.  .   CC: Sharilyn Sites, MD      Matthew Payne 08/28/2022 Patient ID: Erasmo Score, male   DOB: [birth year], 76 y.o.   MRN: [id number]

## 2022-08-28 ENCOUNTER — Encounter: Payer: Self-pay | Admitting: Urology

## 2022-08-28 LAB — URINALYSIS, ROUTINE W REFLEX MICROSCOPIC
Bilirubin, UA: NEGATIVE
Glucose, UA: NEGATIVE
Ketones, UA: NEGATIVE
Leukocytes,UA: NEGATIVE
Nitrite, UA: NEGATIVE
Protein,UA: NEGATIVE
RBC, UA: NEGATIVE
Specific Gravity, UA: 1.03 (ref 1.005–1.030)
Urobilinogen, Ur: 1 mg/dL (ref 0.2–1.0)
pH, UA: 5 (ref 5.0–7.5)

## 2022-10-13 ENCOUNTER — Other Ambulatory Visit: Payer: Self-pay

## 2022-10-13 ENCOUNTER — Emergency Department (HOSPITAL_COMMUNITY): Payer: Medicare Other

## 2022-10-13 ENCOUNTER — Emergency Department (HOSPITAL_COMMUNITY)
Admission: EM | Admit: 2022-10-13 | Discharge: 2022-10-13 | Disposition: A | Discharge status: Home / Self Care | Payer: Medicare Other | Attending: Emergency Medicine | Admitting: Emergency Medicine

## 2022-10-13 ENCOUNTER — Encounter (HOSPITAL_COMMUNITY): Payer: Self-pay | Admitting: *Deleted

## 2022-10-13 DIAGNOSIS — R531 Weakness: Secondary | ICD-10-CM | POA: Diagnosis not present

## 2022-10-13 DIAGNOSIS — I10 Essential (primary) hypertension: Secondary | ICD-10-CM | POA: Diagnosis not present

## 2022-10-13 DIAGNOSIS — U071 COVID-19: Secondary | ICD-10-CM | POA: Diagnosis not present

## 2022-10-13 LAB — CBC WITH DIFFERENTIAL/PLATELET
Abs Immature Granulocytes: 0.04 10*3/uL (ref 0.00–0.07)
Basophils Absolute: 0 10*3/uL (ref 0.0–0.1)
Basophils Relative: 0 %
Eosinophils Absolute: 0 10*3/uL (ref 0.0–0.5)
Eosinophils Relative: 0 %
HCT: 46.6 % (ref 39.0–52.0)
Hemoglobin: 15.1 g/dL (ref 13.0–17.0)
Immature Granulocytes: 1 %
Lymphocytes Relative: 20 %
Lymphs Abs: 1.4 10*3/uL (ref 0.7–4.0)
MCH: 30.4 pg (ref 26.0–34.0)
MCHC: 32.4 g/dL (ref 30.0–36.0)
MCV: 94 fL (ref 80.0–100.0)
Monocytes Absolute: 1.2 10*3/uL — ABNORMAL HIGH (ref 0.1–1.0)
Monocytes Relative: 17 %
Neutro Abs: 4.3 10*3/uL (ref 1.7–7.7)
Neutrophils Relative %: 62 %
Platelets: 159 10*3/uL (ref 150–400)
RBC: 4.96 MIL/uL (ref 4.22–5.81)
RDW: 13.2 % (ref 11.5–15.5)
WBC: 7 10*3/uL (ref 4.0–10.5)
nRBC: 0 % (ref 0.0–0.2)

## 2022-10-13 LAB — BASIC METABOLIC PANEL
Anion gap: 5 (ref 5–15)
BUN: 22 mg/dL (ref 8–23)
CO2: 30 mmol/L (ref 22–32)
Calcium: 10 mg/dL (ref 8.9–10.3)
Chloride: 104 mmol/L (ref 98–111)
Creatinine, Ser: 0.84 mg/dL (ref 0.61–1.24)
GFR, Estimated: >60 mL/min (ref >60)
Glucose, Bld: 103 mg/dL — ABNORMAL HIGH (ref 70–99)
Potassium: 4.2 mmol/L (ref 3.5–5.1)
Sodium: 139 mmol/L (ref 135–145)

## 2022-10-13 LAB — RESP PANEL BY RT-PCR (RSV, FLU A&B, COVID)  RVPGX2
Influenza A by PCR: NEGATIVE
Influenza B by PCR: NEGATIVE
Resp Syncytial Virus by PCR: NEGATIVE
SARS Coronavirus 2 by RT PCR: POSITIVE — AB

## 2022-10-13 LAB — CBG MONITORING, ED: Glucose-Capillary: 111 mg/dL — ABNORMAL HIGH (ref 70–99)

## 2022-10-13 MED ORDER — ACETAMINOPHEN 325 MG PO TABS
650.0000 mg | ORAL_TABLET | Freq: Once | ORAL | Status: AC
  Administered 2022-10-13: 650 mg via ORAL
  Filled 2022-10-13: qty 2

## 2022-10-13 NOTE — ED Triage Notes (Addendum)
Pt c/o generalized weakness and nausea onset x 2 days, pt denies v/d, denies SOB & CP, pt states, " I was really unstable this morning." Pt A&O x4, pt states, "My wife has Covid.", pt febrile in triage

## 2022-10-13 NOTE — ED Provider Notes (Signed)
[facility] EMERGENCY DEPARTMENT Provider Note   CSN: [id number] Arrival date & time: 10/13/22  1414     History  Chief Complaint  Patient presents with   Weakness    Matthew Payne is a 76 y.o. male presents to the ED complaining of generalized weakness and nausea that has been going on for 2 days.  Patient states he felt very unstable on his feet this morning and had to use a cane to help him walk.  His wife is COVID-positive.  He also reports body aches.  Denies shortness of breath, chest pain, vomiting, diarrhea, syncope, chest tightness, fever, chills.        Home Medications Prior to Admission medications   Medication Sig Start Date End Date Taking? Authorizing Provider  Bioflavonoid Products (ESTER C PO) Take 1,000 mg by mouth daily.    [provider]  enalapril (VASOTEC) 10 MG tablet Take 10 mg by mouth daily.    [provider]  pravastatin (PRAVACHOL) 40 MG tablet Take 40 mg by mouth daily. 09/30/19   [provider]  sildenafil (REVATIO) 20 MG tablet Take 2-3 tabs po prn in combination with '10mg'$  tadalafil 08/27/22   Irine Seal, MD  tadalafil (CIALIS) 5 MG tablet Take up to 4 tablets po as needed daily or up to 2 tablets daily po with 2-3 '20mg'$  sildenafil as needed for sexual activity.  Do not take within 4 hours of tamsulosin/flomax. 08/27/22   Irine Seal, MD  tamsulosin (FLOMAX) 0.4 MG CAPS capsule Take 2 capsules (0.8 mg total) by mouth daily. 05/28/22   Irine Seal, MD  zolpidem (AMBIEN) 10 MG tablet Take by mouth. 01/07/22   [provider]      Allergies    Patient has no known allergies.    Review of Systems   Review of Systems  Constitutional:  Negative for chills and fever.  HENT:  Negative for congestion.   Respiratory:  Positive for cough. Negative for chest tightness and shortness of breath.   Cardiovascular:  Negative for chest pain.  Gastrointestinal:  Positive for nausea. Negative for diarrhea and vomiting.   Musculoskeletal:  Positive for myalgias.  Neurological:  Positive for weakness and headaches. Negative for dizziness, syncope and light-headedness.    Physical Exam Updated Vital Signs BP (!) 143/78   Pulse 90   Temp 98.2 F (36.8 C) (Oral)   Resp (!) 21   Ht 6' (1.829 m)   Wt 81.6 kg   SpO2 96%   BMI 24.41 kg/m  Physical Exam Vitals and nursing note reviewed.  Constitutional:      General: He is not in acute distress.    Appearance: He is not ill-appearing.  HENT:     Mouth/Throat:     Mouth: Mucous membranes are moist.     Pharynx: Oropharynx is clear.  Cardiovascular:     Rate and Rhythm: Normal rate and regular rhythm.     Pulses: Normal pulses.     Heart sounds: Normal heart sounds.  Pulmonary:     Effort: Pulmonary effort is normal. No tachypnea or respiratory distress.     Breath sounds: Normal breath sounds and air entry. No wheezing, rhonchi or rales.  Abdominal:     General: Abdomen is flat. Bowel sounds are normal. There is no distension.     Palpations: Abdomen is soft.     Tenderness: There is no abdominal tenderness.  Musculoskeletal:     Right lower leg: No edema.  Left lower leg: No edema.  Skin:    General: Skin is warm and dry.     Capillary Refill: Capillary refill takes less than 2 seconds.     Coloration: Skin is not cyanotic or pale.  Neurological:     General: No focal deficit present.     Mental Status: He is alert. Mental status is at baseline.     Sensory: Sensation is intact.     Motor: Motor function is intact. No weakness.     Comments: No unilateral weakness or focal deficit identified on exam.  5/5 strength in bilateral upper and lower extremities.  Psychiatric:        Mood and Affect: Mood normal.        Behavior: Behavior normal.     ED Results / Procedures / Treatments   Labs (all labs ordered are listed, but only abnormal results are displayed) Labs Reviewed  RESP PANEL BY RT-PCR (RSV, FLU A&B, COVID)  [id number] -  Abnormal; Notable for the following components:      Result Value   SARS Coronavirus 2 by RT PCR POSITIVE (*)    All other components within normal limits  BASIC METABOLIC PANEL - Abnormal; Notable for the following components:   Glucose, Bld 103 (*)    All other components within normal limits  CBC WITH DIFFERENTIAL/PLATELET - Abnormal; Notable for the following components:   Monocytes Absolute 1.2 (*)    All other components within normal limits  CBG MONITORING, ED - Abnormal; Notable for the following components:   Glucose-Capillary 111 (*)    All other components within normal limits    EKG None  Radiology DG Chest 2 View  Result Date: 10/13/2022 CLINICAL DATA:  Weakness and COVID exposure EXAM: CHEST - 2 VIEW COMPARISON:  05/02/2018 FINDINGS: Normal cardiomediastinal silhouette. Aortic atherosclerotic calcification. No focal consolidation, pleural effusion, or pneumothorax. No acute osseous abnormality. IMPRESSION: No active cardiopulmonary disease. Electronically Signed   By: Placido Sou M.D.   On: 10/13/2022 20:01    Procedures Procedures    Medications Ordered in ED Medications  acetaminophen (TYLENOL) tablet 650 mg (650 mg Oral Given 10/13/22 1450)    ED Course/ Medical Decision Making/ A&P                           Medical Decision Making Amount and/or Complexity of Data Reviewed Labs: ordered. Radiology: ordered.  Risk OTC drugs.   This patient presents to the ED with chief complaint(s) of weakness and nausea with pertinent past medical history of hypertension, COVID exposure.The complaint involves an extensive differential diagnosis and also carries with it a high risk of complications and morbidity.    The differential diagnosis includes COVID, influenza, RSV, pneumonia, URI of other viral etiology, electrolyte disturbance  The initial plan is to obtain baseline labs and chest x-ray  Additional history obtained: Additional history obtained from   none Records reviewed  none  Initial Assessment:   Patient was febrile in triage with a temp of 102.2F and was given oral Tylenol.  On initial assessment, patient is not febrile, in no acute distress, appears comfortable.  Lungs are clear to auscultation bilaterally.  Heart rate is normal with regular rhythm.  Abdomen is soft and nontender to palpation.  Skin is warm and dry, nondiaphoretic.  He has 5/5 strength equally bilaterally for upper and lower extremities.  Patient reports he is feeling better than when he first arrived.  Independent  ECG/labs interpretation:  The following labs were independently interpreted:  COVID positive, influenza negative, RSV negative CBC reveals no leukocytosis or anemia. CBG initially 111. Metabolic panel reveals no major electrolyte disturbances. ECG demonstrates sinus rhythm with occasional PACs.  No evidence of ischemia or infarction.   Independent visualization and interpretation of imaging: I independently visualized the following imaging with scope of interpretation limited to determining acute life threatening conditions related to emergency care: Chest x-ray, which revealed no focal consolidation to suggest pneumonia, pleural effusion, or pneumothorax.  I agree with radiologist interpretation.  Treatment and Reassessment: Patient treated in ED with Tylenol for fever, he reports he feels much better than when he first arrived.  Will allow patient to eat and drink.  Laboratory workup does not suggest infectious process requiring hospitalization or inpatient treatment.  Patient reports he would like to be discharged home.  Other treatment options considered:   N/A  Disposition:   The patient has been appropriately medically screened and/or stabilized in the ED. I have low suspicion for any other emergent medical condition which would require further screening, evaluation or treatment in the ED or require inpatient management. At time of discharge the  patient is hemodynamically stable and in no acute distress. I have discussed work-up results and diagnosis with patient and answered all questions. Patient is agreeable with discharge plan. We discussed strict return precautions for returning to the emergency department and they verbalized understanding.  Discussed supportive care measures at home including use of Tylenol and ibuprofen for fevers, body aches, and headache.  Recommended PCP follow-up if his symptoms do not begin to improve with time.           Final Clinical Impression(s) / ED Diagnoses Final diagnoses:  [id number]    Rx / DC Orders ED Discharge Orders     None         Pat Kocher, [place] 10/13/22 2122    Isla Pence, MD 10/13/22 2350

## 2022-10-13 NOTE — Discharge Instructions (Signed)
Thank you for allowing me to be part of your care today.  You tested positive for COVID-19.  Please follow CDC guidelines for mask wearing and/or quarantine.    I recommend taking Tylenol or ibuprofen as needed for headache, fever, body aches.  Please follow-up with your primary care provider if your symptoms do not improve with treatment and time.  Please do your best to stay well-hydrated and get plenty of rest.    Return to the ED if you experience worsening of your symptoms or have any new concerns.

## 2022-10-21 DIAGNOSIS — E119 Type 2 diabetes mellitus without complications: Secondary | ICD-10-CM | POA: Diagnosis not present

## 2022-10-21 DIAGNOSIS — Z0001 Encounter for general adult medical examination with abnormal findings: Secondary | ICD-10-CM | POA: Diagnosis not present

## 2022-10-21 DIAGNOSIS — Z6824 Body mass index (BMI) 24.0-24.9, adult: Secondary | ICD-10-CM | POA: Diagnosis not present

## 2022-10-21 DIAGNOSIS — C61 Malignant neoplasm of prostate: Secondary | ICD-10-CM | POA: Diagnosis not present

## 2022-10-21 DIAGNOSIS — E7849 Other hyperlipidemia: Secondary | ICD-10-CM | POA: Diagnosis not present

## 2022-10-21 DIAGNOSIS — Z1331 Encounter for screening for depression: Secondary | ICD-10-CM | POA: Diagnosis not present

## 2022-10-21 DIAGNOSIS — E782 Mixed hyperlipidemia: Secondary | ICD-10-CM | POA: Diagnosis not present

## 2022-10-21 DIAGNOSIS — I10 Essential (primary) hypertension: Secondary | ICD-10-CM | POA: Diagnosis not present

## 2022-10-21 DIAGNOSIS — M25511 Pain in right shoulder: Secondary | ICD-10-CM | POA: Diagnosis not present

## 2022-11-10 DIAGNOSIS — D225 Melanocytic nevi of trunk: Secondary | ICD-10-CM | POA: Diagnosis not present

## 2022-11-10 DIAGNOSIS — Z08 Encounter for follow-up examination after completed treatment for malignant neoplasm: Secondary | ICD-10-CM | POA: Diagnosis not present

## 2022-11-10 DIAGNOSIS — Z1283 Encounter for screening for malignant neoplasm of skin: Secondary | ICD-10-CM | POA: Diagnosis not present

## 2022-11-10 DIAGNOSIS — D485 Neoplasm of uncertain behavior of skin: Secondary | ICD-10-CM | POA: Diagnosis not present

## 2022-11-10 DIAGNOSIS — Z85820 Personal history of malignant melanoma of skin: Secondary | ICD-10-CM | POA: Diagnosis not present

## 2022-11-26 ENCOUNTER — Encounter: Payer: Self-pay | Admitting: Urology

## 2022-11-26 ENCOUNTER — Ambulatory Visit (INDEPENDENT_AMBULATORY_CARE_PROVIDER_SITE_OTHER): Payer: Medicare Other | Admitting: Urology

## 2022-11-26 VITALS — BP 138/62 | HR 78 | Ht 72.0 in | Wt 180.0 lb

## 2022-11-26 DIAGNOSIS — C61 Malignant neoplasm of prostate: Secondary | ICD-10-CM | POA: Diagnosis not present

## 2022-11-26 DIAGNOSIS — R350 Frequency of micturition: Secondary | ICD-10-CM | POA: Diagnosis not present

## 2022-11-26 DIAGNOSIS — N403 Nodular prostate with lower urinary tract symptoms: Secondary | ICD-10-CM

## 2022-11-26 DIAGNOSIS — R3912 Poor urinary stream: Secondary | ICD-10-CM | POA: Diagnosis not present

## 2022-11-26 DIAGNOSIS — R972 Elevated prostate specific antigen [PSA]: Secondary | ICD-10-CM

## 2022-11-26 DIAGNOSIS — R3915 Urgency of urination: Secondary | ICD-10-CM

## 2022-11-26 DIAGNOSIS — N5201 Erectile dysfunction due to arterial insufficiency: Secondary | ICD-10-CM

## 2022-11-26 MED ORDER — DIAZEPAM 5 MG PO TABS: ORAL_TABLET | ORAL | 0 refills | Status: DC

## 2022-11-26 NOTE — Progress Notes (Signed)
Subjective:  1. Prostate cancer ([place])   2. Elevated PSA   3. Nodular prostate with lower urinary tract symptoms   4. Weak urinary stream   5. Urgency of urination   6. Urinary frequency   7. Erectile dysfunction due to arterial insufficiency     11/26/22: Zoe returns today in f/u.  He had the repeat PSA on 10/21/22 with his PCP and it was 12.1 which is down slightly.  He has low risk prostate cancer on surveillance since biopsy in 4/22 and has a nodular prostate with obstruction.  His PSA was up to 12.5 in November after coming off of finasteride for side effects.  He is on tamsulosin for the LUTS and tadalafil and sildenafil for the ED. He had a low risk MRI with a 76m prostate in 5/23.   His IPSS is 15 with urgency.   He had COVID around Christmas.   He had a transient episode of voiding difficulty prior to the episode of COVID but it resolved spontaneously.   08/27/22: TDavinderreturns today in f/u.  He was started on finasteride in May and his PSA fell from 10.5 in 4/23  to 5.9 in 8/23  but he was having sexual side effects and the PSa is now back up to 12.5.  His PSA has been rather labile in the past and he has a 7681mprostate on MRI.  He has low risk prostate cancer with a single core of GG1 disease with 5% involvement on his biopsy from 01/30/21.   05/28/22: ThKereleturns today in f/u for history of low risk prostate cancer on AS.  His prostate was 7668mn his last MRIP.  His PSA is down to 5.9 with the addition of finasteride on 02/19/22.  He remains on tamsulosin as well.  His IPSS is 11 with reduced nocturia but he reports progressive ED since starting it. He was benefiting from sildenafil and tadalafil but those aren't working now.    02/19/22: ThoEsteventurns today in f/u.  He had a repeat MRIP on 02/16/22 that showed a 120m69mostate with no high risk lesions.  He remains on AS for low risk prostate cancer that was diagnosed on 01/30/21.  His PSA is 10.5 which is up from 8.4 in 10/22 but it  was 10.8 on 05/22/21.   He is voiding better with reduced nocturia. He has some urgency.  He has no dysuria or hematuria.   His IPSS is 15.  He has some issues with urgency but minimal nocturia.  He remains on tamsulosin but is off of the tadalafil.   He was having some dizziness.  He wasn't responding to the tadalafil.  He was given sildenafil in 8/22 but didn't have a response either.    02/14/21: ThomShermonurns today in f/u to discuss his prostate biopsy results.   He was found to have a 76 ml prostate with a single core with 5% Gleason 6 prostate cancer with PNI.  There was another core with atypia at the left apex.   He has T2a Nx Mx disease.  He has preexisting ED and BOO.   He had an MRIP in 5/20 that showed an 76ml39mstate with no suspicious lesions. He does have a middle lobe that is primarily on the right on MRI.     GU Hx: Mr. PowelDefreese 72 yo26M who is sent by Dr. GoldiHilma Favorsan elevated PSA of 4.1. His PSA has been rising steadily for the last  few years and was 1.58 in 2011. He had a biopsy for a nodule about 15 years ago. He has moderate LUTS with an IPSS of 11. He is currently on tadalafil which was started about 2 months ago and has helped his symptoms. He has had no hematuria or dysuria. He had a foley in 7/19 when hospitalized and [facility] with acute pancreatitis possibly from alcohol. He had mild pyuria but a negative culture. He stopped drinking after that. He has a history of Peyronie's disease and had a phalloplasty in 2008 by Dr. Karsten Ro.      IPSS     Row Name 11/26/22 1300         International Prostate Symptom Score   How often have you had the sensation of not emptying your bladder? Less than 1 in 5     How often have you had to urinate less than every two hours? About half the time     How often have you found you stopped and started again several times when you urinated? Less than half the time     How often have you found it difficult to postpone urination? More than half  the time     How often have you had a weak urinary stream? About half the time     How often have you had to strain to start urination? Less than 1 in 5 times     How many times did you typically get up at night to urinate? 1 Time     Total IPSS Score 15       Quality of Life due to urinary symptoms   If you were to spend the rest of your life with your urinary condition just the way it is now how would you feel about that? Unhappy                  ROS:  ROS:  A complete review of systems was performed.  All systems are negative except for pertinent findings as noted.   Review of Systems  Constitutional:  Negative for weight loss.  Endo/Heme/Allergies:  Bruises/bleeds easily.  All other systems reviewed and are negative.   No Known Allergies  Outpatient Encounter Medications as of 11/26/2022  Medication Sig   Bioflavonoid Products (ESTER C PO) Take 1,000 mg by mouth daily.   diazepam (VALIUM) 5 MG tablet Take 1-2 po one hour prior to MRI   enalapril (VASOTEC) 10 MG tablet Take 10 mg by mouth daily.   pravastatin (PRAVACHOL) 40 MG tablet Take 40 mg by mouth daily.   sildenafil (REVATIO) 20 MG tablet Take 2-3 tabs po prn in combination with 82m tadalafil   tadalafil (CIALIS) 5 MG tablet Take up to 4 tablets po as needed daily or up to 2 tablets daily po with 2-3 230msildenafil as needed for sexual activity.  Do not take within 4 hours of tamsulosin/flomax.   tamsulosin (FLOMAX) 0.4 MG CAPS capsule Take 2 capsules (0.8 mg total) by mouth daily.   zolpidem (AMBIEN) 10 MG tablet Take by mouth.   No facility-administered encounter medications on file as of 11/26/2022.    Past Medical History:  Diagnosis Date   Hypercholesteremia    Hypertension     Past Surgical History:  Procedure Laterality Date   CATARACT EXTRACTION, BILATERAL  5 yrs ago   COLONOSCOPY  05/28/2011   Procedure: COLONOSCOPY;  Surgeon: NaRogene HoustonMD;  [facility];  Service: Endoscopy;  Laterality: N/A;   COLONOSCOPY N/A 07/31/2021   Procedure: COLONOSCOPY;  Surgeon: [name], MD;  [facility];  Service: Endoscopy;  Laterality: N/A;  13:00   CYSTECTOMY     back & leg   INGUINAL HERNIA REPAIR  07/01/2012   Procedure: HERNIA REPAIR INGUINAL ADULT;  Surgeon: [name], MD;  Location: [facility];  Service: General;  Laterality: Right;  Right Inguinal Herniorraphy with Mesh   Nesbit  Plication     POLYPECTOMY  07/31/2021   Procedure: POLYPECTOMY;  Surgeon: [name], MD;  [facility]: [facility];  Service: Endoscopy;;   TONSILLECTOMY      Social History   Socioeconomic History   Marital status: Married    Spouse name: Not on file   Number of children: Not on file   Years of education: Not on file   Highest education level: Not on file  Occupational History   Not on file  Tobacco Use   Smoking status: Former    Types: Cigarettes    Quit date: 02/24/1978    Years since quitting: 44.7   Smokeless tobacco: Never  Vaping Use   Vaping Use: Never used  Substance and Sexual Activity   Alcohol use: Yes   Drug use: No   Sexual activity: Yes  Other Topics Concern   Not on file  Social History Narrative   Not on file   Social Determinants of Health   Financial Resource Strain: Not on file  Food Insecurity: Not on file  Transportation Needs: Not on file  Physical Activity: Not on file  Stress: Not on file  Social Connections: Not on file  Intimate Partner Violence: Not on file    History reviewed. No pertinent family history.     Objective: Vitals:   11/26/22 1336  BP: 138/62  Pulse: 78      Physical Exam Vitals reviewed.  Constitutional:      Appearance: Normal appearance.  Genitourinary:    Comments:   Neurological:     Mental Status: He is alert.     Lab Results:  Results for orders placed or performed in visit on 11/26/22 (from the past 24 hour(s))  Urinalysis, Routine w reflex microscopic     Status: Abnormal    Collection Time: 11/26/22  1:36 PM  Result Value Ref Range   Specific Gravity, UA 1.020 1.005 - 1.030   pH, UA 5.5 5.0 - 7.5   Color, UA Yellow Yellow   Appearance Ur Clear Clear   Leukocytes,UA Negative Negative   Protein,UA Negative Negative/Trace   Glucose, UA Negative Negative   Ketones, UA Trace (A) Negative   RBC, UA Negative Negative   Bilirubin, UA Negative Negative   Urobilinogen, Ur 1.0 0.2 - 1.0 mg/dL   Nitrite, UA Negative Negative   Microscopic Examination Comment    Narrative   Performed at:  [facility] [address] [place], [place]  [id number] Lab Director: Mina Marble MT, Phone:  [phone]       Lab Results  Component Value Date   PSA1 12.5 (H) 08/21/2022   PSA1 5.9 (H) 05/22/2022   PSA1 10.5 (H) 02/13/2022  PSA 12.1 10/21/22.   BMET No results for input(s): "NA", "K", "CL", "CO2", "GLUCOSE", "BUN", "CREATININE", "CALCIUM" in the last 72 hours. PSA  09/30/20: 6.4.  01/07/21:   7.7 with a 21.6% f/t ratio.  01/14/21   8.0  with 19% f/t ratio.  DG Chest 2 View  Result Date: 10/13/2022 CLINICAL DATA:  Weakness and COVID exposure EXAM: CHEST - 2 VIEW COMPARISON:  05/02/2018 FINDINGS: Normal cardiomediastinal silhouette. Aortic atherosclerotic calcification. No focal consolidation, pleural effusion, or pneumothorax. No acute osseous abnormality. IMPRESSION: No active cardiopulmonary disease. Electronically Signed   By: Placido Sou M.D.   On: 10/13/2022 20:01    Assessment & Plan:   Prostate cancer.   His PSA is down slightly.  I will repeat the PSA and MRI in 3 months and make a determination about a biopsy. I have sent valium for the MRI.   Nodular prostate with BOO.   Continue tamsulosin bid.  ED.  He is doing better with combination tadalafil and sildenafil..     Meds ordered this encounter  Medications   diazepam (VALIUM) 5 MG tablet    Sig: Take 1-2 po one hour prior to MRI    Dispense:  2 tablet    Refill:  0      Orders Placed This Encounter  Procedures   MR PROSTATE W WO CONTRAST    Standing Status:   Future    Standing Expiration Date:   05/27/2023    Order Specific Question:   If indicated for the ordered procedure, I authorize the administration of contrast media per Radiology protocol    Answer:   Yes    Order Specific Question:   What is the patient's sedation requirement?    Answer:   Anti-anxiety    Order Specific Question:   Does the patient have a pacemaker or implanted devices?    Answer:   No    Order Specific Question:   Radiology Contrast Protocol - do NOT remove file path    Answer:   [email or web address]    Order Specific Question:   Preferred imaging location?    Answer:   [facility] (table limit-550lbs)   Urinalysis, Routine w reflex microscopic   PSA    Standing Status:   Future    Standing Expiration Date:   05/27/2023      Return in about 3 months (around 02/24/2023) for with MRI and PSA results. .   CC: Sharilyn Sites, MD      Irine Seal 11/27/2022 Patient ID: Erasmo Score, male   DOB: April 17, 1946, 77 y.o.   MRN: [id number]

## 2022-11-27 LAB — URINALYSIS, ROUTINE W REFLEX MICROSCOPIC
Bilirubin, UA: NEGATIVE
Glucose, UA: NEGATIVE
Leukocytes,UA: NEGATIVE
Nitrite, UA: NEGATIVE
Protein,UA: NEGATIVE
RBC, UA: NEGATIVE
Specific Gravity, UA: 1.02 (ref 1.005–1.030)
Urobilinogen, Ur: 1 mg/dL (ref 0.2–1.0)
pH, UA: 5.5 (ref 5.0–7.5)

## 2022-11-30 DIAGNOSIS — L988 Other specified disorders of the skin and subcutaneous tissue: Secondary | ICD-10-CM | POA: Diagnosis not present

## 2022-11-30 DIAGNOSIS — D225 Melanocytic nevi of trunk: Secondary | ICD-10-CM | POA: Diagnosis not present

## 2022-11-30 DIAGNOSIS — D485 Neoplasm of uncertain behavior of skin: Secondary | ICD-10-CM | POA: Diagnosis not present

## 2023-02-24 ENCOUNTER — Ambulatory Visit: Payer: Medicare Other

## 2023-02-24 ENCOUNTER — Ambulatory Visit (HOSPITAL_COMMUNITY)
Admission: RE | Admit: 2023-02-24 | Discharge: 2023-02-24 | Disposition: A | Discharge status: Home / Self Care | Payer: Medicare Other | Source: Ambulatory Visit | Attending: Urology | Admitting: Urology

## 2023-02-24 DIAGNOSIS — C61 Malignant neoplasm of prostate: Secondary | ICD-10-CM | POA: Diagnosis not present

## 2023-02-24 DIAGNOSIS — N403 Nodular prostate with lower urinary tract symptoms: Secondary | ICD-10-CM | POA: Insufficient documentation

## 2023-02-24 DIAGNOSIS — R972 Elevated prostate specific antigen [PSA]: Secondary | ICD-10-CM | POA: Diagnosis not present

## 2023-02-24 MED ORDER — GADOBUTROL 1 MMOL/ML IV SOLN
7.5000 mL | Freq: Once | INTRAVENOUS | Status: AC | PRN
  Administered 2023-02-24: 7.5 mL via INTRAVENOUS

## 2023-03-01 ENCOUNTER — Other Ambulatory Visit: Payer: Medicare Other

## 2023-03-01 DIAGNOSIS — N403 Nodular prostate with lower urinary tract symptoms: Secondary | ICD-10-CM | POA: Diagnosis not present

## 2023-03-01 DIAGNOSIS — C61 Malignant neoplasm of prostate: Secondary | ICD-10-CM | POA: Diagnosis not present

## 2023-03-01 DIAGNOSIS — R972 Elevated prostate specific antigen [PSA]: Secondary | ICD-10-CM

## 2023-03-02 LAB — PSA: Prostate Specific Ag, Serum: 5.4 ng/mL — ABNORMAL HIGH (ref 0.0–4.0)

## 2023-03-11 ENCOUNTER — Ambulatory Visit (INDEPENDENT_AMBULATORY_CARE_PROVIDER_SITE_OTHER): Payer: Medicare Other | Admitting: Urology

## 2023-03-11 ENCOUNTER — Encounter: Payer: Self-pay | Admitting: Urology

## 2023-03-11 VITALS — BP 134/71 | HR 73

## 2023-03-11 DIAGNOSIS — N5201 Erectile dysfunction due to arterial insufficiency: Secondary | ICD-10-CM | POA: Diagnosis not present

## 2023-03-11 DIAGNOSIS — R350 Frequency of micturition: Secondary | ICD-10-CM

## 2023-03-11 DIAGNOSIS — R3915 Urgency of urination: Secondary | ICD-10-CM

## 2023-03-11 DIAGNOSIS — C61 Malignant neoplasm of prostate: Secondary | ICD-10-CM

## 2023-03-11 DIAGNOSIS — N138 Other obstructive and reflux uropathy: Secondary | ICD-10-CM | POA: Diagnosis not present

## 2023-03-11 DIAGNOSIS — R972 Elevated prostate specific antigen [PSA]: Secondary | ICD-10-CM | POA: Diagnosis not present

## 2023-03-11 DIAGNOSIS — N403 Nodular prostate with lower urinary tract symptoms: Secondary | ICD-10-CM

## 2023-03-11 DIAGNOSIS — R3912 Poor urinary stream: Secondary | ICD-10-CM

## 2023-03-11 LAB — URINALYSIS, ROUTINE W REFLEX MICROSCOPIC
Bilirubin, UA: NEGATIVE
Glucose, UA: NEGATIVE
Ketones, UA: NEGATIVE
Leukocytes,UA: NEGATIVE
Nitrite, UA: NEGATIVE
Protein,UA: NEGATIVE
RBC, UA: NEGATIVE
Specific Gravity, UA: 1.015 (ref 1.005–1.030)
Urobilinogen, Ur: 0.2 mg/dL (ref 0.2–1.0)
pH, UA: 6 (ref 5.0–7.5)

## 2023-03-11 MED ORDER — TAMSULOSIN HCL 0.4 MG PO CAPS: 0.8000 mg | ORAL_CAPSULE | Freq: Every day | ORAL | 3 refills | Status: DC

## 2023-03-11 NOTE — Progress Notes (Signed)
Subjective:  1. Elevated PSA   2. Prostate cancer ([facility])   3. Nodular prostate with lower urinary tract symptoms   4. Weak urinary stream   5. Urgency of urination   6. Urinary frequency   7. Erectile dysfunction due to arterial insufficiency     03/11/23: Matthew Payne returns today in f/u.  He has low risk prostate cancer on surveillance since biopsy in 4/22 and has a nodular prostate with obstruction.  He had an MRI on 5/8 that showed a prostate with no high risk lesions.  His PSA is back down to 5.4 after jumping to 12.5 after a bout of COVID.   He remains on tamsulosin 0.8mg  and his IPSS is 12.  He has frequency and urgency with a sensation of incomplete emptying.  He has minimal nocturia.  He remains on tadalafil and sildenafil.    11/26/22: Matthew Payne returns today in f/u.  He had the repeat PSA on 10/21/22 with his PCP and it was 12.1 which is down slightly.  He has low risk prostate cancer on surveillance since biopsy in 4/22 and has a nodular prostate with obstruction.  His PSA was up to 12.5 in November after coming off of finasteride for side effects.  He is on tamsulosin for the LUTS and tadalafil and sildenafil for the ED. He had a low risk MRI with a prostate in 5/23.   His IPSS is 15 with urgency.   He had COVID around Christmas.   He had a transient episode of voiding difficulty prior to the episode of COVID but it resolved spontaneously.   08/27/22: Matthew Payne returns today in f/u.  He was started on finasteride in May and his PSA fell from 10.5 in 4/23  to 5.9 in 8/23  but he was having sexual side effects and the PSa is now back up to 12.5.  His PSA has been rather labile in the past and he has a prostate on MRI.  He has low risk prostate cancer with a single core of GG1 disease with 5% involvement on his biopsy from 01/30/21.   05/28/22: Matthew Payne returns today in f/u for history of low risk prostate cancer on AS.  His prostate was on his last MRIP.  His PSA is down to 5.9 with  the addition of finasteride on 02/19/22.  He remains on tamsulosin as well.  His IPSS is 11 with reduced nocturia but he reports progressive ED since starting it. He was benefiting from sildenafil and tadalafil but those aren't working now.    02/19/22: Matthew Payne returns today in f/u.  He had a repeat MRIP on 02/16/22 that showed a prostate with no high risk lesions.  He remains on AS for low risk prostate cancer that was diagnosed on 01/30/21.  His PSA is 10.5 which is up from 8.4 in 10/22 but it was 10.8 on 05/22/21.   He is voiding better with reduced nocturia. He has some urgency.  He has no dysuria or hematuria.   His IPSS is 15.  He has some issues with urgency but minimal nocturia.  He remains on tamsulosin but is off of the tadalafil.   He was having some dizziness.  He wasn't responding to the tadalafil.  He was given sildenafil in 8/22 but didn't have a response either.    02/14/21: Matthew Payne returns today in f/u to discuss his prostate biopsy results.   He was found to have a 48 ml prostate with a single core  with 5% Gleason 6 prostate cancer with PNI.  There was another core with atypia at the left apex.   He has T2a Nx Mx disease.  He has preexisting ED and BOO.   He had an MRIP in 5/20 that showed an 80ml prostate with no suspicious lesions. He does have a middle lobe that is primarily on the right on MRI.     GU Hx: Matthew Payne is a 77 yo WM who is sent by Dr. Phillips Odor for an elevated PSA of 4.1. His PSA has been rising steadily for the last few years and was 1.58 in 2011. He had a biopsy for a nodule about 15 years ago. He has moderate LUTS with an IPSS of 11. He is currently on tadalafil which was started about 2 months ago and has helped his symptoms. He has had no hematuria or dysuria. He had a foley in 7/19 when hospitalized and [facility] with acute pancreatitis possibly from alcohol. He had mild pyuria but a negative culture. He stopped drinking after that. He has a history of Peyronie's disease and had a  phalloplasty in 2008 by Dr. Vernie Ammons.      IPSS     Row Name 03/11/23 0900         International Prostate Symptom Score   How often have you had the sensation of not emptying your bladder? Less than 1 in 5     How often have you had to urinate less than every two hours? About half the time     How often have you found you stopped and started again several times when you urinated? Less than 1 in 5 times     How often have you found it difficult to postpone urination? More than half the time     How often have you had a weak urinary stream? Less than half the time     How often have you had to strain to start urination? Less than 1 in 5 times     How many times did you typically get up at night to urinate? None     Total IPSS Score 12       Quality of Life due to urinary symptoms   If you were to spend the rest of your life with your urinary condition just the way it is now how would you feel about that? Unhappy                   ROS:  ROS:  A complete review of systems was performed.  All systems are negative except for pertinent findings as noted.   Review of Systems  Constitutional:  Negative for weight loss.  Musculoskeletal:  Positive for joint pain.  Endo/Heme/Allergies:  Bruises/bleeds easily.  All other systems reviewed and are negative.   No Known Allergies  Outpatient Encounter Medications as of 03/11/2023  Medication Sig   Bioflavonoid Products (ESTER C PO) Take 1,000 mg by mouth daily.   diazepam (VALIUM) 5 MG tablet Take 1-2 po one hour prior to MRI   enalapril (VASOTEC) 10 MG tablet Take 10 mg by mouth daily.   pravastatin (PRAVACHOL) 40 MG tablet Take 40 mg by mouth daily.   sildenafil (REVATIO) 20 MG tablet Take 2-3 tabs po prn in combination with 10mg  tadalafil   tadalafil (CIALIS) 5 MG tablet Take up to 4 tablets po as needed daily or up to 2 tablets daily po with 2-3 20mg  sildenafil as needed for sexual activity.  Do not take within 4 hours of  tamsulosin/flomax.   zolpidem (AMBIEN) 10 MG tablet Take by mouth.   [DISCONTINUED] tamsulosin (FLOMAX) 0.4 MG CAPS capsule Take 2 capsules (0.8 mg total) by mouth daily.   tamsulosin (FLOMAX) 0.4 MG CAPS capsule Take 2 capsules (0.8 mg total) by mouth daily.   No facility-administered encounter medications on file as of 03/11/2023.    Past Medical History:  Diagnosis Date   Hypercholesteremia    Hypertension     Past Surgical History:  Procedure Laterality Date   CATARACT EXTRACTION, BILATERAL  5 yrs ago   COLONOSCOPY  05/28/2011   Procedure: COLONOSCOPY;  Surgeon: Malissa Hippo, MD;  [facility];  Service: Endoscopy;  Laterality: N/A;   COLONOSCOPY N/A 07/31/2021   Procedure: COLONOSCOPY;  Surgeon: Malissa Hippo, MD;  [facility];  Service: Endoscopy;  Laterality: N/A;  13:00   CYSTECTOMY     back & leg   INGUINAL HERNIA REPAIR  07/01/2012   Procedure: HERNIA REPAIR INGUINAL ADULT;  Surgeon: Dalia Heading, MD;  Location: [facility];  Service: General;  Laterality: Right;  Right Inguinal Herniorraphy with Mesh   Nesbit  Plication     POLYPECTOMY  07/31/2021   Procedure: POLYPECTOMY;  Surgeon: Malissa Hippo, MD;  [facility];  Service: Endoscopy;;   TONSILLECTOMY      Social History   Socioeconomic History   Marital status: Married    Spouse name: Not on file   Number of children: Not on file   Years of education: Not on file   Highest education level: Not on file  Occupational History   Not on file  Tobacco Use   Smoking status: Former    Types: Cigarettes    Quit date: 02/24/1978    Years since quitting: 45.0   Smokeless tobacco: Never  Vaping Use   Vaping Use: Never used  Substance and Sexual Activity   Alcohol use: Yes   Drug use: No   Sexual activity: Yes  Other Topics Concern   Not on file  Social History Narrative   Not on file   Social Determinants of Health   Financial Resource Strain: Not on file  Food  Insecurity: Not on file  Transportation Needs: Not on file  Physical Activity: Not on file  Stress: Not on file  Social Connections: Not on file  Intimate Partner Violence: Not on file    No family history on file.     Objective: Vitals:   03/11/23 0932  BP: 134/71  Pulse: 73      Physical Exam Vitals reviewed.  Constitutional:      Appearance: Normal appearance.  Genitourinary:    Comments:   Neurological:     Mental Status: He is alert.     Lab Results:  No results found for this or any previous visit (from the past 24 hour(s)).      Lab Results  Component Value Date   PSA1 5.4 (H) 03/01/2023   PSA1 12.5 (H) 08/21/2022   PSA1 5.9 (H) 05/22/2022  PSA 12.1 10/21/22.   BMET No results for input(s): "NA", "K", "CL", "CO2", "GLUCOSE", "BUN", "CREATININE", "CALCIUM" in the last 72 hours. PSA  09/30/20: 6.4.  01/07/21:   7.7 with a 21.6% f/t ratio.  01/14/21   8.0  with 19% f/t ratio.  MR PROSTATE W WO CONTRAST  Result Date: 02/26/2023 CLINICAL DATA:  Low risk prostate carcinoma.  Active surveillance. EXAM: MR  PROSTATE WITHOUT AND WITH CONTRAST TECHNIQUE: Multiplanar multisequence MRI images were obtained of the pelvis centered about the prostate. Pre and post contrast images were obtained. CONTRAST:  7.79mL GADAVIST GADOBUTROL 1 MMOL/ML IV SOLN COMPARISON:  Prostate MRI 02/16/2022 FINDINGS: Prostate: The peripheral zone is thinned by the enlarged transitional zone. Normal high signal intensity in the peripheral zone on T2 weighted imaging without focal lesion (series 6) no foci of restricted diffusion within the peripheral zone. Postcontrast T1 weighted imaging demonstrates no suspicious enhancement. The transitional zone is enlarged by capsulated nodules without suspicious imaging characteristics on T2 weighted imaging. Volume:  6.3 x 5.8 x 6.8 cm (volume = 130 cm^3) Transcapsular spread:  Absent Seminal vesicle involvement: Absent Neurovascular bundle involvement:  Absent Pelvic adenopathy: Absent Bone metastasis: Absent Other findings: None IMPRESSION: 1. No high-grade carcinoma identified in the peripheral zone. PI-RADS (v2.1): 1 2. Enlarged nodular transitional zone most consistent with benign prostate hypertrophy. PI-RADS (v2.1): 2 Electronically Signed   By: Genevive Bi M.D.   On: 02/26/2023 16:13    Assessment & Plan:   Prostate cancer.   His PSA is down markedly and the MRI is low risk with a prostate.    Nodular prostate with BOO.   Continue tamsulosin bid.  I discussed PAE and Aquablation. I will send him to IR to discuss PAE.   ED.  He is doing better with combination tadalafil and sildenafil..     Meds ordered this encounter  Medications   tamsulosin (FLOMAX) 0.4 MG CAPS capsule    Sig: Take 2 capsules (0.8 mg total) by mouth daily.    Dispense:  180 capsule    Refill:  3     Orders Placed This Encounter  Procedures   Urinalysis, Routine w reflex microscopic   PSA    Standing Status:   Future    Standing Expiration Date:   03/10/2024   Ambulatory referral to Interventional Radiology    Referral Priority:   Routine    Referral Type:   Consultation    Referral Reason:   Specialty Services Required    Referred to Provider:   Irish Lack, MD    Requested Specialty:   Interventional Radiology    Number of Visits Requested:   1      Return in about 6 months (around 09/11/2023) for with labs. .   CC: Assunta Found, MD      Bjorn Pippin 03/12/2023 Patient ID: Wyline Copas, male   DOB: 04/12/1946, 77 y.o.   MRN: [id number]

## 2023-03-23 DIAGNOSIS — E7849 Other hyperlipidemia: Secondary | ICD-10-CM | POA: Diagnosis not present

## 2023-03-23 DIAGNOSIS — E782 Mixed hyperlipidemia: Secondary | ICD-10-CM | POA: Diagnosis not present

## 2023-03-23 DIAGNOSIS — C61 Malignant neoplasm of prostate: Secondary | ICD-10-CM | POA: Diagnosis not present

## 2023-03-23 DIAGNOSIS — E663 Overweight: Secondary | ICD-10-CM | POA: Diagnosis not present

## 2023-03-23 DIAGNOSIS — E119 Type 2 diabetes mellitus without complications: Secondary | ICD-10-CM | POA: Diagnosis not present

## 2023-03-23 DIAGNOSIS — F5101 Primary insomnia: Secondary | ICD-10-CM | POA: Diagnosis not present

## 2023-03-23 DIAGNOSIS — I10 Essential (primary) hypertension: Secondary | ICD-10-CM | POA: Diagnosis not present

## 2023-03-23 DIAGNOSIS — Z6825 Body mass index (BMI) 25.0-25.9, adult: Secondary | ICD-10-CM | POA: Diagnosis not present

## 2023-06-16 DIAGNOSIS — I10 Essential (primary) hypertension: Secondary | ICD-10-CM | POA: Diagnosis not present

## 2023-06-16 DIAGNOSIS — Z6824 Body mass index (BMI) 24.0-24.9, adult: Secondary | ICD-10-CM | POA: Diagnosis not present

## 2023-06-16 DIAGNOSIS — R300 Dysuria: Secondary | ICD-10-CM | POA: Diagnosis not present

## 2023-06-17 ENCOUNTER — Encounter: Payer: Self-pay | Admitting: Urology

## 2023-06-17 ENCOUNTER — Ambulatory Visit (INDEPENDENT_AMBULATORY_CARE_PROVIDER_SITE_OTHER): Payer: Medicare Other | Admitting: Urology

## 2023-06-17 VITALS — BP 128/73 | HR 77 | Ht 72.0 in | Wt 180.0 lb

## 2023-06-17 DIAGNOSIS — R3915 Urgency of urination: Secondary | ICD-10-CM

## 2023-06-17 DIAGNOSIS — N529 Male erectile dysfunction, unspecified: Secondary | ICD-10-CM | POA: Diagnosis not present

## 2023-06-17 DIAGNOSIS — R300 Dysuria: Secondary | ICD-10-CM | POA: Diagnosis not present

## 2023-06-17 DIAGNOSIS — N138 Other obstructive and reflux uropathy: Secondary | ICD-10-CM | POA: Diagnosis not present

## 2023-06-17 DIAGNOSIS — N403 Nodular prostate with lower urinary tract symptoms: Secondary | ICD-10-CM

## 2023-06-17 DIAGNOSIS — N3000 Acute cystitis without hematuria: Secondary | ICD-10-CM | POA: Diagnosis not present

## 2023-06-17 LAB — URINALYSIS, ROUTINE W REFLEX MICROSCOPIC
Bilirubin, UA: NEGATIVE
Glucose, UA: NEGATIVE
Ketones, UA: NEGATIVE
Nitrite, UA: NEGATIVE
Protein,UA: NEGATIVE
Specific Gravity, UA: 1.03 (ref 1.005–1.030)
Urobilinogen, Ur: 1 mg/dL (ref 0.2–1.0)
pH, UA: 6 (ref 5.0–7.5)

## 2023-06-17 LAB — MICROSCOPIC EXAMINATION
Bacteria, UA: NONE SEEN
WBC, UA: >30 /hpf — AB (ref 0–5)

## 2023-06-17 NOTE — Progress Notes (Signed)
Subjective:  1. Acute cystitis without hematuria   2. Dysuria   3. Urgency of urination      06/17/23:  Toriano returns today with concerns for a UTI.  He saw his PCP yesterday and had a dip UA that was Nit+ with trace LE.  His UA today has >30 WBC.  He was started on Cipro yesterday.  HE was having dysuria but no hematuria.  He had no frequency or urgency.  He had increased nocturia last week but that is better.  He feels like he is emptying ok.    He had some discomfort in the left groin but has a small LIH.   He remains on tamsulosin 0.8mg .   03/11/23: Heinrich returns today in f/u.  He has low risk prostate cancer on surveillance since biopsy in 4/22 and has a nodular prostate with obstruction.  He had an MRI on 5/8 that showed a prostate with no high risk lesions.  His PSA is back down to 5.4 after jumping to 12.5 after a bout of COVID.   He remains on tamsulosin 0.8mg  and his IPSS is 12.  He has frequency and urgency with a sensation of incomplete emptying.  He has minimal nocturia.  He remains on tadalafil and sildenafil.    11/26/22: Eithen returns today in f/u.  He had the repeat PSA on 10/21/22 with his PCP and it was 12.1 which is down slightly.  He has low risk prostate cancer on surveillance since biopsy in 4/22 and has a nodular prostate with obstruction.  His PSA was up to 12.5 in November after coming off of finasteride for side effects.  He is on tamsulosin for the LUTS and tadalafil and sildenafil for the ED. He had a low risk MRI with a prostate in 5/23.   His IPSS is 15 with urgency.   He had COVID around Christmas.   He had a transient episode of voiding difficulty prior to the episode of COVID but it resolved spontaneously.   08/27/22: Gianna returns today in f/u.  He was started on finasteride in May and his PSA fell from 10.5 in 4/23  to 5.9 in 8/23  but he was having sexual side effects and the PSa is now back up to 12.5.  His PSA has been rather labile in the past and he  has a prostate on MRI.  He has low risk prostate cancer with a single core of GG1 disease with 5% involvement on his biopsy from 01/30/21.   05/28/22: Kymere returns today in f/u for history of low risk prostate cancer on AS.  His prostate was on his last MRIP.  His PSA is down to 5.9 with the addition of finasteride on 02/19/22.  He remains on tamsulosin as well.  His IPSS is 11 with reduced nocturia but he reports progressive ED since starting it. He was benefiting from sildenafil and tadalafil but those aren't working now.    02/19/22: Yair returns today in f/u.  He had a repeat MRIP on 02/16/22 that showed a prostate with no high risk lesions.  He remains on AS for low risk prostate cancer that was diagnosed on 01/30/21.  His PSA is 10.5 which is up from 8.4 in 10/22 but it was 10.8 on 05/22/21.   He is voiding better with reduced nocturia. He has some urgency.  He has no dysuria or hematuria.   His IPSS is 15.  He has some issues with urgency  but minimal nocturia.  He remains on tamsulosin but is off of the tadalafil.   He was having some dizziness.  He wasn't responding to the tadalafil.  He was given sildenafil in 8/22 but didn't have a response either.    02/14/21: Devaj returns today in f/u to discuss his prostate biopsy results.   He was found to have a 48 ml prostate with a single core with 5% Gleason 6 prostate cancer with PNI.  There was another core with atypia at the left apex.   He has T2a Nx Mx disease.  He has preexisting ED and BOO.   He had an MRIP in 5/20 that showed an 80ml prostate with no suspicious lesions. He does have a middle lobe that is primarily on the right on MRI.     GU Hx: Mr. Roginski is a 77 yo WM who is sent by Dr. Phillips Odor for an elevated PSA of 4.1. His PSA has been rising steadily for the last few years and was 1.58 in 2011. He had a biopsy for a nodule about 15 years ago. He has moderate LUTS with an IPSS of 11. He is currently on tadalafil which was started  about 2 months ago and has helped his symptoms. He has had no hematuria or dysuria. He had a foley in 7/19 when hospitalized and [facility] with acute pancreatitis possibly from alcohol. He had mild pyuria but a negative culture. He stopped drinking after that. He has a history of Peyronie's disease and had a phalloplasty in 2008 by Dr. Vernie Ammons.             ROS:  ROS:  A complete review of systems was performed.  All systems are negative except for pertinent findings as noted.   Review of Systems  Constitutional:  Negative for weight loss.  Musculoskeletal:  Positive for joint pain.  Endo/Heme/Allergies:  Bruises/bleeds easily.  All other systems reviewed and are negative.   No Known Allergies  Outpatient Encounter Medications as of 06/17/2023  Medication Sig   Bioflavonoid Products (ESTER C PO) Take 1,000 mg by mouth daily.   diazepam (VALIUM) 5 MG tablet Take 1-2 po one hour prior to MRI   enalapril (VASOTEC) 10 MG tablet Take 10 mg by mouth daily.   pravastatin (PRAVACHOL) 40 MG tablet Take 40 mg by mouth daily.   sildenafil (REVATIO) 20 MG tablet Take 2-3 tabs po prn in combination with 10mg  tadalafil   tadalafil (CIALIS) 5 MG tablet Take up to 4 tablets po as needed daily or up to 2 tablets daily po with 2-3 20mg  sildenafil as needed for sexual activity.  Do not take within 4 hours of tamsulosin/flomax.   tamsulosin (FLOMAX) 0.4 MG CAPS capsule Take 2 capsules (0.8 mg total) by mouth daily.   zolpidem (AMBIEN) 10 MG tablet Take by mouth.   No facility-administered encounter medications on file as of 06/17/2023.    Past Medical History:  Diagnosis Date   Hypercholesteremia    Hypertension     Past Surgical History:  Procedure Laterality Date   CATARACT EXTRACTION, BILATERAL  5 yrs ago   COLONOSCOPY  05/28/2011   Procedure: COLONOSCOPY;  Surgeon: Malissa Hippo, MD;  [facility];  Service: Endoscopy;  Laterality: N/A;   COLONOSCOPY N/A 07/31/2021   Procedure:  COLONOSCOPY;  Surgeon: Malissa Hippo, MD;  Location: [facility];  Service: Endoscopy;  Laterality: N/A;  13:00   CYSTECTOMY     back & leg  INGUINAL HERNIA REPAIR  07/01/2012   Procedure: HERNIA REPAIR INGUINAL ADULT;  Surgeon: Dalia Heading, MD;  Location: [facility];  Service: General;  Laterality: Right;  Right Inguinal Herniorraphy with Mesh   Nesbit  Plication     POLYPECTOMY  07/31/2021   Procedure: POLYPECTOMY;  Surgeon: Malissa Hippo, MD;  [facility];  Service: Endoscopy;;   TONSILLECTOMY      Social History   Socioeconomic History   Marital status: Married    Spouse name: Not on file   Number of children: Not on file   Years of education: Not on file   Highest education level: Not on file  Occupational History   Not on file  Tobacco Use   Smoking status: Former    Current packs/day: 0.00    Types: Cigarettes    Quit date: 02/24/1978    Years since quitting: 45.3   Smokeless tobacco: Never  Vaping Use   Vaping status: Never Used  Substance and Sexual Activity   Alcohol use: Yes   Drug use: No   Sexual activity: Yes  Other Topics Concern   Not on file  Social History Narrative   Not on file   Social Determinants of Health   Financial Resource Strain: Not on file  Food Insecurity: Not on file  Transportation Needs: Not on file  Physical Activity: Not on file  Stress: Not on file  Social Connections: Unknown (03/03/2022)   Received from [facility]   Social Network    Social Network: Not on file  Intimate Partner Violence: Unknown (01/23/2022)   Received from [facility]   HITS    Physically Hurt: Not on file    Insult or Talk Down To: Not on file    Threaten Physical Harm: Not on file    Scream or Curse: Not on file    History reviewed. No pertinent family history.     Objective: Vitals:   06/17/23 1001  BP: 128/73  Pulse: 77      Physical Exam Vitals reviewed.  Constitutional:      Appearance: Normal appearance.   Abdominal:     General: Abdomen is flat.     Palpations: Abdomen is soft.     Hernia: A hernia (left inguinal) is present.  Genitourinary:    Comments: Nl phallus with adequate meatus.  Scrotum, testes and epididymis normal.   Neurological:     Mental Status: He is alert.     Lab Results:  Results for orders placed or performed in visit on 06/17/23 (from the past 24 hour(s))  Urinalysis, Routine w reflex microscopic     Status: Abnormal   Collection Time: 06/17/23 10:30 AM  Result Value Ref Range   Specific Gravity, UA 1.030 1.005 - 1.030   pH, UA 6.0 5.0 - 7.5   Color, UA Yellow Yellow   Appearance Ur Cloudy (A) Clear   Leukocytes,UA 2+ (A) Negative   Protein,UA Negative Negative/Trace   Glucose, UA Negative Negative   Ketones, UA Negative Negative   RBC, UA Trace (A) Negative   Bilirubin, UA Negative Negative   Urobilinogen, Ur 1.0 0.2 - 1.0 mg/dL   Nitrite, UA Negative Negative   Microscopic Examination See below:    Narrative   Performed at:  [address] - [facility] [address] [place], [place]  [id number] Lab Director: Chinita Pester MT, Phone:  [phone]  Microscopic Examination     Status: Abnormal   Collection Time: 06/17/23 10:30 AM  Urine  Result Value Ref Range   WBC, UA >30 (A) 0 - 5 /hpf   RBC, Urine 0-2 0 - 2 /hpf   Epithelial Cells (non renal) 0-10 0 - 10 /hpf   Mucus, UA Present (A) Not Estab.   Bacteria, UA None seen None seen/Few   Narrative   Performed at:  [address] - [facility] [address], [place], [place]  [id number] Lab Director: Chinita Pester MT, Phone:  [phone]    His UA from yesterday was reviewed along with the one for today.      Lab Results  Component Value Date   PSA1 5.4 (H) 03/01/2023   PSA1 12.5 (H) 08/21/2022   PSA1 5.9 (H) 05/22/2022  PSA 12.1 10/21/22.   BMET No results for input(s): "NA", "K", "CL", "CO2", "GLUCOSE", "BUN", "CREATININE", "CALCIUM" in the last 72 hours. PSA  09/30/20: 6.4.   01/07/21:   7.7 with a 21.6% f/t ratio.  01/14/21   8.0  with 19% f/t ratio.  No results found. I reviewed the CT from 2019 and the hernia on the left was present.  Assessment & Plan:  Acute cystitis.  He is on Cipro but still has pyuria.  I will do a culture and adjust antibiotics as indicated.   He will keep his November appointment.   Left groin pain.   He has a small reducible LIH.   Prostate cancer.   His PSA has been  down markedly and the MRI is low risk with a prostate.    Nodular prostate with BOO.   Continue tamsulosin bid.  He never saw IR about the PAE.  ED.  He is still doing better with combination tadalafil and sildenafil..     No orders of the defined types were placed in this encounter.    Orders Placed This Encounter  Procedures   Urine Culture   Microscopic Examination   Urinalysis, Routine w reflex microscopic      Return for Keep planned f/u in November. .   CC: Assunta Found, MD      Bjorn Pippin 06/18/2023 Patient ID: Wyline Copas, male   DOB: 03-Dec-1945, 77 y.o.   MRN: [id number]

## 2023-06-19 LAB — URINE CULTURE: Organism ID, Bacteria: NO GROWTH

## 2023-09-02 ENCOUNTER — Ambulatory Visit: Payer: Medicare Other | Admitting: Urology

## 2023-09-02 ENCOUNTER — Encounter: Payer: Self-pay | Admitting: Urology

## 2023-09-02 ENCOUNTER — Other Ambulatory Visit: Payer: Self-pay

## 2023-09-02 VITALS — BP 149/73 | HR 79

## 2023-09-02 DIAGNOSIS — Z8546 Personal history of malignant neoplasm of prostate: Secondary | ICD-10-CM | POA: Diagnosis not present

## 2023-09-02 DIAGNOSIS — Z23 Encounter for immunization: Secondary | ICD-10-CM | POA: Diagnosis not present

## 2023-09-02 DIAGNOSIS — N403 Nodular prostate with lower urinary tract symptoms: Secondary | ICD-10-CM

## 2023-09-02 DIAGNOSIS — N5201 Erectile dysfunction due to arterial insufficiency: Secondary | ICD-10-CM

## 2023-09-02 DIAGNOSIS — R3912 Poor urinary stream: Secondary | ICD-10-CM

## 2023-09-02 DIAGNOSIS — N138 Other obstructive and reflux uropathy: Secondary | ICD-10-CM

## 2023-09-02 DIAGNOSIS — R9721 Rising PSA following treatment for malignant neoplasm of prostate: Secondary | ICD-10-CM

## 2023-09-02 DIAGNOSIS — Z87440 Personal history of urinary (tract) infections: Secondary | ICD-10-CM

## 2023-09-02 DIAGNOSIS — C61 Malignant neoplasm of prostate: Secondary | ICD-10-CM | POA: Diagnosis not present

## 2023-09-02 DIAGNOSIS — R972 Elevated prostate specific antigen [PSA]: Secondary | ICD-10-CM | POA: Diagnosis not present

## 2023-09-02 DIAGNOSIS — R350 Frequency of micturition: Secondary | ICD-10-CM | POA: Diagnosis not present

## 2023-09-02 NOTE — Progress Notes (Signed)
Subjective:  1. Prostate cancer (HCC)   2. Elevated PSA   3. Nodular prostate with lower urinary tract symptoms   4. Weak urinary stream   5. Urinary frequency   6. Personal history of urinary infection   7. Erectile dysfunction due to arterial insufficiency      09/02/23: Matthew Payne returns today in f/u. His UA is clear today.  He hasn't had a PSA prior to this visit. It was scheduled for 11/23.  He has stable LUTS with an IPSS of 12.  He has nocturia x 1.  He remains on tamsulosin 0.8mg  daily.   He has ED and has scripts for tadalafil and sildenafil but hasn't been using them much.    06/17/23:  Matthew Payne returns today with concerns for a UTI.  He saw his PCP yesterday and had a dip UA that was Nit+ with trace LE.  His UA today has >30 WBC.  He was started on Cipro yesterday.  HE was having dysuria but no hematuria.  He had no frequency or urgency.  He had increased nocturia last week but that is better.  He feels like he is emptying ok.    He had some discomfort in the left groin but has a small LIH.   He remains on tamsulosin 0.8mg .   03/11/23: Matthew Payne returns today in f/u.  He has low risk prostate cancer on surveillance since biopsy in 4/22 and has a nodular prostate with obstruction.  He had an MRI on 5/8 that showed a prostate with no high risk lesions.  His PSA is back down to 5.4 after jumping to 12.5 after a bout of COVID.   He remains on tamsulosin 0.8mg  and his IPSS is 12.  He has frequency and urgency with a sensation of incomplete emptying.  He has minimal nocturia.  He remains on tadalafil and sildenafil.    11/26/22: Matthew Payne returns today in f/u.  He had the repeat PSA on 10/21/22 with his PCP and it was 12.1 which is down slightly.  He has low risk prostate cancer on surveillance since biopsy in 4/22 and has a nodular prostate with obstruction.  His PSA was up to 12.5 in November after coming off of finasteride for side effects.  He is on tamsulosin for the LUTS and tadalafil and  sildenafil for the ED. He had a low risk MRI with a prostate in 5/23.   His IPSS is 15 with urgency.   He had COVID around Christmas.   He had a transient episode of voiding difficulty prior to the episode of COVID but it resolved spontaneously.   08/27/22: Matthew Payne returns today in f/u.  He was started on finasteride in May and his PSA fell from 10.5 in 4/23  to 5.9 in 8/23  but he was having sexual side effects and the PSa is now back up to 12.5.  His PSA has been rather labile in the past and he has a prostate on MRI.  He has low risk prostate cancer with a single core of GG1 disease with 5% involvement on his biopsy from 01/30/21.   05/28/22: Matthew Payne returns today in f/u for history of low risk prostate cancer on AS.  His prostate was on his last MRIP.  His PSA is down to 5.9 with the addition of finasteride on 02/19/22.  He remains on tamsulosin as well.  His IPSS is 11 with reduced nocturia but he reports progressive ED since starting it. He was benefiting  from sildenafil and tadalafil but those aren't working now.    02/19/22: Matthew Payne returns today in f/u.  He had a repeat MRIP on 02/16/22 that showed a prostate with no high risk lesions.  He remains on AS for low risk prostate cancer that was diagnosed on 01/30/21.  His PSA is 10.5 which is up from 8.4 in 10/22 but it was 10.8 on 05/22/21.   He is voiding better with reduced nocturia. He has some urgency.  He has no dysuria or hematuria.   His IPSS is 15.  He has some issues with urgency but minimal nocturia.  He remains on tamsulosin but is off of the tadalafil.   He was having some dizziness.  He wasn't responding to the tadalafil.  He was given sildenafil in 8/22 but didn't have a response either.    02/14/21: Matthew Payne returns today in f/u to discuss his prostate biopsy results.   He was found to have a 48 ml prostate with a single core with 5% Gleason 6 prostate cancer with PNI.  There was another core with atypia at the left apex.   He has  T2a Nx Mx disease.  He has preexisting ED and BOO.   He had an MRIP in 5/20 that showed an 80ml prostate with no suspicious lesions. He does have a middle lobe that is primarily on the right on MRI.     GU Hx: Matthew Payne is a 77 yo WM who is sent by Matthew Payne for an elevated PSA of 4.1. His PSA has been rising steadily for the last few years and was 1.58 in 2011. He had a biopsy for a nodule about 15 years ago. He has moderate LUTS with an IPSS of 11. He is currently on tadalafil which was started about 2 months ago and has helped his symptoms. He has had no hematuria or dysuria. He had a foley in 7/19 when hospitalized and [facility] with acute pancreatitis possibly from alcohol. He had mild pyuria but a negative culture. He stopped drinking after that. He has a history of Peyronie's disease and had a phalloplasty in 2008 by Matthew Payne.      IPSS     Row Name 09/02/23 1500         International Prostate Symptom Score   How often have you had the sensation of not emptying your bladder? Less than 1 in 5     How often have you had to urinate less than every two hours? About half the time     How often have you found you stopped and started again several times when you urinated? Less than 1 in 5 times     How often have you found it difficult to postpone urination? About half the time     How often have you had a weak urinary stream? Less than half the time     How often have you had to strain to start urination? Less than 1 in 5 times     How many times did you typically get up at night to urinate? 1 Time     Total IPSS Score 12       Quality of Life due to urinary symptoms   If you were to spend the rest of your life with your urinary condition just the way it is now how would you feel about that? Mostly Disatisfied  ROS:  ROS:  A complete review of systems was performed.  All systems are negative except for pertinent findings as noted.   Review of Systems   Constitutional:  Negative for weight loss.  Musculoskeletal:  Positive for joint pain.  Endo/Heme/Allergies:  Bruises/bleeds easily.  All other systems reviewed and are negative.   No Known Allergies  Outpatient Encounter Medications as of 09/02/2023  Medication Sig   Bioflavonoid Products (ESTER C PO) Take 1,000 mg by mouth daily.   diazepam (VALIUM) 5 MG tablet Take 1-2 po one hour prior to MRI   enalapril (VASOTEC) 10 MG tablet Take 10 mg by mouth daily.   pravastatin (PRAVACHOL) 40 MG tablet Take 40 mg by mouth daily.   sildenafil (REVATIO) 20 MG tablet Take 2-3 tabs po prn in combination with 10mg  tadalafil   tadalafil (CIALIS) 5 MG tablet Take up to 4 tablets po as needed daily or up to 2 tablets daily po with 2-3 20mg  sildenafil as needed for sexual activity.  Do not take within 4 hours of tamsulosin/flomax.   tamsulosin (FLOMAX) 0.4 MG CAPS capsule Take 2 capsules (0.8 mg total) by mouth daily.   zolpidem (AMBIEN) 10 MG tablet Take by mouth.   No facility-administered encounter medications on file as of 09/02/2023.    Past Medical History:  Diagnosis Date   Hypercholesteremia    Hypertension     Past Surgical History:  Procedure Laterality Date   CATARACT EXTRACTION, BILATERAL  5 yrs ago   COLONOSCOPY  05/28/2011   Procedure: COLONOSCOPY;  Surgeon: Malissa Hippo, MD;  [facility];  Service: Endoscopy;  Laterality: N/A;   COLONOSCOPY N/A 07/31/2021   Procedure: COLONOSCOPY;  Surgeon: Malissa Hippo, MD;  [facility];  Service: Endoscopy;  Laterality: N/A;  13:00   CYSTECTOMY     back & leg   INGUINAL HERNIA REPAIR  07/01/2012   Procedure: HERNIA REPAIR INGUINAL ADULT;  Surgeon: Dalia Heading, MD;  Location: [facility];  Service: General;  Laterality: Right;  Right Inguinal Herniorraphy with Mesh   Nesbit  Plication     POLYPECTOMY  07/31/2021   Procedure: POLYPECTOMY;  Surgeon: Malissa Hippo, MD;  [facility]: [facility];  Service:  Endoscopy;;   TONSILLECTOMY      Social History   Socioeconomic History   Marital status: Married    Spouse name: Not on file   Number of children: Not on file   Years of education: Not on file   Highest education level: Not on file  Occupational History   Not on file  Tobacco Use   Smoking status: Former    Current packs/day: 0.00    Types: Cigarettes    Quit date: 02/24/1978    Years since quitting: 45.5   Smokeless tobacco: Never  Vaping Use   Vaping status: Never Used  Substance and Sexual Activity   Alcohol use: Yes   Drug use: No   Sexual activity: Yes  Other Topics Concern   Not on file  Social History Narrative   Not on file   Social Determinants of Health   Financial Resource Strain: Not on file  Food Insecurity: Not on file  Transportation Needs: Not on file  Physical Activity: Not on file  Stress: Not on file  Social Connections: Unknown (03/03/2022)   Received from [facility]   Social Network    Social Network: Not on file  Intimate Partner Violence: Unknown (01/23/2022)  Received from [facility], [facility]   HITS    Physically Hurt: Not on file    Insult or Talk Down To: Not on file    Threaten Physical Harm: Not on file    Scream or Curse: Not on file    History reviewed. No pertinent family history.     Objective: Vitals:   09/02/23 1508  BP: (!) 149/73  Pulse: 79      Physical Exam Vitals reviewed.  Constitutional:      Appearance: Normal appearance.  Abdominal:     General: Abdomen is flat.     Palpations: Abdomen is soft.     Hernia: A hernia (left inguinal) is present.  Genitourinary:    Comments: Nl phallus with adequate meatus.  Scrotum, testes and epididymis normal.   Neurological:     Mental Status: He is alert.     Lab Results:  Results for orders placed or performed in visit on 09/02/23 (from the past 24 hour(s))  Urinalysis, Routine w reflex microscopic     Status: None   Collection  Time: 09/02/23  3:10 PM  Result Value Ref Range   Specific Gravity, UA 1.030 1.005 - 1.030   pH, UA 6.0 5.0 - 7.5   Color, UA Yellow Yellow   Appearance Ur Clear Clear   Leukocytes,UA Negative Negative   Protein,UA Negative Negative/Trace   Glucose, UA Negative Negative   Ketones, UA Negative Negative   RBC, UA Negative Negative   Bilirubin, UA Negative Negative   Urobilinogen, Ur 0.2 0.2 - 1.0 mg/dL   Nitrite, UA Negative Negative   Microscopic Examination Comment    Narrative   Performed at:  [address] - [facility] [address] [place], [place]  [id number] Lab Director: Chinita Pester MT, Phone:  [phone]  PSA     Status: Abnormal   Collection Time: 09/02/23  3:53 PM  Result Value Ref Range   Prostate Specific Ag, Serum 7.1 (H) 0.0 - 4.0 ng/mL   Narrative   Performed at:  [address], [place], [place]  [id number] Lab Director: Jolene Schimke MD, Phone:  [phone]     His UA from yesterday was reviewed along with the one for today.      Lab Results  Component Value Date   PSA1 7.1 (H) 09/02/2023   PSA1 5.4 (H) 03/01/2023   PSA1 12.5 (H) 08/21/2022  PSA 12.1 10/21/22.   BMET No results for input(s): "NA", "K", "CL", "CO2", "GLUCOSE", "BUN", "CREATININE", "CALCIUM" in the last 72 hours. PSA  09/30/20: 6.4.  01/07/21:   7.7 with a 21.6% f/t ratio.  01/14/21   8.0  with 19% f/t ratio.  No results found. I reviewed the CT from 2019 and the hernia on the left was present.  Assessment & Plan:  History of UTI.  Urine is clear today.   Prostate cancer.   His PSA has been  down markedly and the MRI is low risk with a prostate.  I will repeat a PSA today and in 6 months.   Nodular prostate with BOO.   Continue tamsulosin bid.  I will redo the order to have him see IR about the PAE.  ED.  He was doing better with combination tadalafil and sildenafil but has not been active..     No orders of the defined types were placed in this  encounter.    Orders Placed This Encounter  Procedures   Urinalysis, Routine w reflex  microscopic   PSA   PSA    Standing Status:   Future    Standing Expiration Date:   09/01/2024   Ambulatory referral to Interventional Radiology    Referral Priority:   Routine    Referral Type:   Consultation    Referral Reason:   Specialty Services Required    Referred to Provider:   Irish Lack, MD    Requested Specialty:   Interventional Radiology    Number of Visits Requested:   1      Return in about 6 months (around 03/01/2024).   CC: Assunta Found, MD      Bjorn Pippin 09/03/2023 Patient ID: Wyline Copas, male   DOB: 1946/10/16, 77 y.o.   MRN: [id number]

## 2023-09-03 LAB — URINALYSIS, ROUTINE W REFLEX MICROSCOPIC
Bilirubin, UA: NEGATIVE
Glucose, UA: NEGATIVE
Ketones, UA: NEGATIVE
Leukocytes,UA: NEGATIVE
Nitrite, UA: NEGATIVE
Protein,UA: NEGATIVE
RBC, UA: NEGATIVE
Specific Gravity, UA: 1.03 (ref 1.005–1.030)
Urobilinogen, Ur: 0.2 mg/dL (ref 0.2–1.0)
pH, UA: 6 (ref 5.0–7.5)

## 2023-09-03 LAB — PSA: Prostate Specific Ag, Serum: 7.1 ng/mL — ABNORMAL HIGH (ref 0.0–4.0)

## 2023-09-08 ENCOUNTER — Inpatient Hospital Stay
Admission: RE | Admit: 2023-09-08 | Discharge: 2023-09-08 | Discharge status: Home / Self Care | Payer: Medicare Other | Source: Ambulatory Visit | Attending: Urology | Admitting: Urology

## 2023-09-08 DIAGNOSIS — N403 Nodular prostate with lower urinary tract symptoms: Secondary | ICD-10-CM | POA: Diagnosis not present

## 2023-09-08 HISTORY — PX: IR RADIOLOGIST EVAL & MGMT: IMG5224

## 2023-09-08 NOTE — Progress Notes (Signed)
Reason for visit: BPH with LUTS. Prostate embolization.   Care Team(s): Primary Care: Assunta Found, MD Urology: Bjorn Pippin, MD   History of Present Illness:  Mr. Matthew Payne is a 77 y.o. male w/ PMHx significant for Peyronie dx s/p phalloplasty in 2008, prostate CA on surveillance (Gleason 3+3=6 w PNI, 01/30/21), and long standing BPH. Pt is closely followed by his urologist, Dr Annabell Howells, and is on medical management with  tamsulosin 0.4 mg qHS. Pt was last seen in urological follow up on 09/02/23 and reported BOO symptoms. He was interested in a minimally invasive option and was referred for evaluation. His most recent imaging w MR Prostate (02/24/23) demonstrated prostatomegaly ~130g. He denies prior catheterization or hematuria.   I used a International Prostatism Symptom Score* (IPSS) Score to quantify his symptoms : 12 points (Moderate symptoms)   *Gery Pray MJ, Melene Muller MP, et al. The American Urological Association symptom index for benign prostatic hyperplasia. The Measurement Committee of the American Urological Association. J Urol A5431891; 284:1324.   Review of Systems: A 12 point ROS discussed and pertinent positives are indicated in the HPI above.  All other systems are negative.   Past Medical History:  Diagnosis Date   Hypercholesteremia    Hypertension     Past Surgical History:  Procedure Laterality Date   CATARACT EXTRACTION, BILATERAL  5 yrs ago   COLONOSCOPY  05/28/2011   Procedure: COLONOSCOPY;  Surgeon: Malissa Hippo, MD;  [facility];  Service: Endoscopy;  Laterality: N/A;   COLONOSCOPY N/A 07/31/2021   Procedure: COLONOSCOPY;  Surgeon: Malissa Hippo, MD;  [facility];  Service: Endoscopy;  Laterality: N/A;  13:00   CYSTECTOMY     back & leg   INGUINAL HERNIA REPAIR  07/01/2012   Procedure: HERNIA REPAIR INGUINAL ADULT;  Surgeon: Dalia Heading, MD;  Location: [facility];  Service: General;  Laterality: Right;  Right Inguinal  Herniorraphy with Mesh   IR RADIOLOGIST EVAL & MGMT  09/08/2023   Nesbit  Plication     POLYPECTOMY  07/31/2021   Procedure: POLYPECTOMY;  Surgeon: Malissa Hippo, MD;  [facility];  Service: Endoscopy;;   TONSILLECTOMY      Allergies: Patient has no known allergies.  Medications: Prior to Admission medications   Medication Sig Start Date End Date Taking? Authorizing Provider  Bioflavonoid Products (ESTER C PO) Take 1,000 mg by mouth daily.    [provider]  diazepam (VALIUM) 5 MG tablet Take 1-2 po one hour prior to MRI 11/26/22   Bjorn Pippin, MD  enalapril (VASOTEC) 10 MG tablet Take 10 mg by mouth daily.    [provider]  pravastatin (PRAVACHOL) 40 MG tablet Take 40 mg by mouth daily. 09/30/19   [provider]  sildenafil (REVATIO) 20 MG tablet Take 2-3 tabs po prn in combination with 10mg  tadalafil 08/27/22   Bjorn Pippin, MD  tadalafil (CIALIS) 5 MG tablet Take up to 4 tablets po as needed daily or up to 2 tablets daily po with 2-3 20mg  sildenafil as needed for sexual activity.  Do not take within 4 hours of tamsulosin/flomax. 08/27/22   Bjorn Pippin, MD  tamsulosin (FLOMAX) 0.4 MG CAPS capsule Take 2 capsules (0.8 mg total) by mouth daily. 03/11/23   Bjorn Pippin, MD  zolpidem Remus Loffler) 10 MG tablet Take by mouth. 01/07/22   [provider]     No family history on file.  Social History  Socioeconomic History   Marital status: Married    Spouse name: Not on file   Number of children: Not on file   Years of education: Not on file   Highest education level: Not on file  Occupational History   Not on file  Tobacco Use   Smoking status: Former    Current packs/day: 0.00    Types: Cigarettes    Quit date: 02/24/1978    Years since quitting: 45.6   Smokeless tobacco: Never  Vaping Use   Vaping status: Never Used  Substance and Sexual Activity   Alcohol use: Yes   Drug use: No   Sexual activity: Yes  Other Topics Concern    Not on file  Social History Narrative   Not on file   Social Determinants of Health   Financial Resource Strain: Not on file  Food Insecurity: Not on file  Transportation Needs: Not on file  Physical Activity: Not on file  Stress: Not on file  Social Connections: Unknown (03/03/2022)   Received from [facility], [facility]   Social Network    Social Network: Not on file    ECOG Status: 0 - Asymptomatic  Review of Systems As above  Vital Signs: BP (!) 140/68   Pulse (!) 58   Temp 97.6 F (36.4 C)   Resp 20   SpO2 96%   Physical Exam  General: WN, NAD  CV: RRR on monitor Pulm: normal work of breathing on RA Abd: S, ND, NT MSK: Grossly normal Psych: Appropriate affect.  Imaging:  MR Prostate (02/24/23) Imaging independently reviewed, demonstrating marked prostatomegaly. Prostate volume ~130g    IMPRESSION:  1. No high-grade carcinoma identified in the peripheral zone. PI-RADS (v2.1): 1  2. Enlarged nodular transitional zone most consistent with benign prostate hypertrophy. PI-RADS (v2.1): 2 Volume: 6.3 x 5.8 x 6.8 cm (volume = 130 cm^3)    Labs:  CBC: Recent Labs    10/13/22 2009  WBC 7.0  HGB 15.1  HCT 46.6  PLT 159    COAGS: No results for input(s): "INR", "APTT" in the last 8760 hours.  BMP: Recent Labs    10/13/22 2009  NA 139  K 4.2  CL 104  CO2 30  GLUCOSE 103*  BUN 22  CALCIUM 10.0  CREATININE 0.84  GFRNONAA >60    IPSS Questionnaire (AUA-7): Over the past month.   1)  How often have you had a sensation of not emptying your bladder completely after you finish urinating?  1 - Less than 1 time in 5  2)  How often have you had to urinate again less than two hours after you finished urinating? 3 - About half the time  3)  How often have you found you stopped and started again several times when you urinated?  2 - Less than half the time  4) How difficult have you found it to postpone urination?  3 - About half the time  5) How  often have you had a weak urinary stream?  1 - Less than 1 time in 5  6) How often have you had to push or strain to begin urination?  1 - Less than 1 time in 5  7) How many times did you most typically get up to urinate from the time you went to bed until the time you got up in the morning?  1 - 1 time  Total score:  0-7 mildly symptomatic   8-19 moderately symptomatic   20-35  severely symptomatic   Assessment and Plan:  77 y/o M w/ PMHx of Peyronie dx s/p phalloplasty in 2008, prostate CA on surveillance (Gleason 3+3=6 w PNI, 01/30/21), and long standing BPH. No reported prior catheterization or hematuria.  Prostatomegaly (~130 mL) PSA, last 7.1 (09/02/23) IPSS Score Total, 12 pts (Moderate symptoms) QoL dissatisfied, on tamsulosin 0.4 mg qD   Given the nature of the disease, I felt that the patient would benefit from Prostate Artery Embolization (PAE).  The procedure has been fully reviewed with the patient/patient's authorized representative. The risks, benefits and alternatives of PAE were fully discussed including technique, potential complications, timeline of improvement, efficacy and durability. The patient/patient's authorized representative has consented to the procedure.     *Will order CTA Pelvis for procedural planning *OK to proceed to schedule PAE on mutual availability.   *Same day procedure, no overnight admission. Procedure to be performed at [facility] *Pt habitus < 6 ft, will plan for trans-radial access. *Rocephin for pre op Abx *CBC, BMP, Coags to be drawn on procedure day  Thank you for this interesting consult.  I greatly enjoyed meeting Matthew Payne and look forward to participating in their care.  A copy of this report was sent to the requesting provider on this date.  Electronically Signed:  Roanna Banning, MD [facility] Radiology   Pager. [phone] Clinic. [phone]  I spent a total of  60 Minutes  in  face to face in clinical consultation, greater than 50% of which was counseling/coordinating care for Mr Matthew Payne's BPH and LUTS

## 2023-09-09 ENCOUNTER — Other Ambulatory Visit: Payer: Self-pay | Admitting: Interventional Radiology

## 2023-09-09 DIAGNOSIS — N403 Nodular prostate with lower urinary tract symptoms: Secondary | ICD-10-CM

## 2023-09-14 ENCOUNTER — Other Ambulatory Visit: Payer: Medicare Other

## 2023-09-23 DIAGNOSIS — E782 Mixed hyperlipidemia: Secondary | ICD-10-CM | POA: Diagnosis not present

## 2023-09-23 DIAGNOSIS — Z6825 Body mass index (BMI) 25.0-25.9, adult: Secondary | ICD-10-CM | POA: Diagnosis not present

## 2023-09-23 DIAGNOSIS — E663 Overweight: Secondary | ICD-10-CM | POA: Diagnosis not present

## 2023-09-23 DIAGNOSIS — I10 Essential (primary) hypertension: Secondary | ICD-10-CM | POA: Diagnosis not present

## 2023-09-23 DIAGNOSIS — E7849 Other hyperlipidemia: Secondary | ICD-10-CM | POA: Diagnosis not present

## 2023-09-23 DIAGNOSIS — F5101 Primary insomnia: Secondary | ICD-10-CM | POA: Diagnosis not present

## 2023-09-23 DIAGNOSIS — R7309 Other abnormal glucose: Secondary | ICD-10-CM | POA: Diagnosis not present

## 2023-09-24 ENCOUNTER — Other Ambulatory Visit (HOSPITAL_COMMUNITY): Payer: Self-pay | Admitting: Interventional Radiology

## 2023-09-24 ENCOUNTER — Ambulatory Visit
Admission: RE | Admit: 2023-09-24 | Discharge: 2023-09-24 | Disposition: A | Discharge status: Home / Self Care | Payer: Medicare Other | Source: Ambulatory Visit | Attending: Interventional Radiology | Admitting: Interventional Radiology

## 2023-09-24 DIAGNOSIS — N403 Nodular prostate with lower urinary tract symptoms: Secondary | ICD-10-CM

## 2023-09-24 DIAGNOSIS — N400 Enlarged prostate without lower urinary tract symptoms: Secondary | ICD-10-CM | POA: Diagnosis not present

## 2023-09-24 MED ORDER — IOPAMIDOL (ISOVUE-370) INJECTION 76%
100.0000 mL | Freq: Once | INTRAVENOUS | Status: AC | PRN
  Administered 2023-09-24: 100 mL via INTRAVENOUS

## 2023-10-24 ENCOUNTER — Other Ambulatory Visit: Payer: Self-pay | Admitting: Radiology

## 2023-10-24 DIAGNOSIS — N401 Enlarged prostate with lower urinary tract symptoms: Secondary | ICD-10-CM

## 2023-10-25 ENCOUNTER — Other Ambulatory Visit: Payer: Self-pay | Admitting: Radiology

## 2023-10-25 NOTE — H&P (Signed)
 Chief Complaint: Patient was seen in consultation today for prostate artery embolization for treatment of BPH w/ BOO.  Referring Physician(s): Dr. Norleen Seltzer, MD  Supervising Physician: Hughes Simmonds  Patient Status: [facility] - Out-pt  History of Present Illness: Matthew Payne is a 78 y.o. male with PMHx of HTN, HLD, and BPH w/ BOO.  Per. Dr. Thelda progress note on 11/20: Matthew Payne is a 78 y.o. male w/ PMHx significant for Peyronie dx s/p phalloplasty in 2008, prostate CA on surveillance (Gleason 3+3=6 w PNI, 01/30/21), and long standing BPH. Pt is closely followed by his urologist, Dr Seltzer, and is on medical management with  tamsulosin  0.4 mg qHS. Pt was last seen in urological follow up on 09/02/23 and reported BOO symptoms. He was interested in a minimally invasive option and was referred for evaluation. His most recent imaging w MR Prostate (02/24/23) demonstrated prostatomegaly ~130g. He denies prior catheterization or hematuria.   PSA, last 7.1 (09/02/23) IPSS Score Total, 12 pts (Moderate symptoms) QoL dissatisfied, on tamsulosin  0.4 mg qD   Given the nature of the disease, I felt that the patient would benefit from Prostate Artery Embolization (PAE).  The procedure has been fully reviewed with the patient/patient's authorized representative. The risks, benefits and alternatives of PAE were fully discussed including technique, potential complications, timeline of improvement, efficacy and durability. The patient/patient's authorized representative has consented to the procedure.    *Will order CTA Pelvis for procedural planning *OK to proceed to schedule PAE on mutual availability.   *Same day procedure, no overnight admission. Procedure to be performed at [facility] *Pt habitus < 6 ft, will plan for trans-radial access. *Rocephin  for pre op Abx *CBC, BMP, Coags to be drawn on procedure day  CTA Pelvis w/ contrast on 09/26/23: IMPRESSION: VASCULAR  1. The right  prostatic artery arises from the common origin of the internal pudendal and inferior gluteal arteries (Car[place] type II) 2. The left prostatic artery arises from the left obturator artery (Car[place] type III). 3.  Aortic Atherosclerosis (ICD10-I70.0).   NON-VASCULAR Prostatomegaly (148 g).  Interventional Radiology was requested for prostate artery embolization. Patient consulted with Dr. Hughes, who approved the request. Patient is scheduled for same in IR today.   All labs and medications are within acceptable parameters. No pertinent allergies. Patient has been NPO since midnight.    Currently without any significant complaints. Patient alert and laying in bed,calm. Denies any fevers, headache, chest pain, SOB,  abdominal pain, nausea, vomiting or bleeding. He does have occ cough.     Past Medical History:  Diagnosis Date   Hypercholesteremia    Hypertension     Past Surgical History:  Procedure Laterality Date   CATARACT EXTRACTION, BILATERAL  5 yrs ago   COLONOSCOPY  05/28/2011   Procedure: COLONOSCOPY;  Surgeon: Claudis RAYMOND Rivet, MD;  [facility];  Service: Endoscopy;  Laterality: N/A;   COLONOSCOPY N/A 07/31/2021   Procedure: COLONOSCOPY;  Surgeon: Rivet Claudis RAYMOND, MD;  [facility];  Service: Endoscopy;  Laterality: N/A;  13:00   CYSTECTOMY     back & leg   INGUINAL HERNIA REPAIR  07/01/2012   Procedure: HERNIA REPAIR INGUINAL ADULT;  Surgeon: Oneil DELENA Budge, MD;  Location: [facility];  Service: General;  Laterality: Right;  Right Inguinal Herniorraphy with Mesh   IR RADIOLOGIST EVAL & MGMT  09/08/2023   Nesbit  Plication     POLYPECTOMY  07/31/2021   Procedure: POLYPECTOMY;  Surgeon:  Golda Claudis PENNER, MD;  [facility];  Service: Endoscopy;;   TONSILLECTOMY      Allergies: Patient has no known allergies.  Medications: Prior to Admission medications   Medication Sig Start Date End Date Taking? Authorizing Provider  Bioflavonoid  Products (ESTER C PO) Take 1,000 mg by mouth daily.    [provider]  diazepam  (VALIUM ) 5 MG tablet Take 1-2 po one hour prior to MRI 11/26/22   Watt Rush, MD  enalapril (VASOTEC) 10 MG tablet Take 10 mg by mouth daily.    [provider]  pravastatin  (PRAVACHOL ) 40 MG tablet Take 40 mg by mouth daily. 09/30/19   [provider]  sildenafil  (REVATIO ) 20 MG tablet Take 2-3 tabs po prn in combination with 10mg  tadalafil  08/27/22   Watt Rush, MD  tadalafil  (CIALIS ) 5 MG tablet Take up to 4 tablets po as needed daily or up to 2 tablets daily po with 2-3 20mg  sildenafil  as needed for sexual activity.  Do not take within 4 hours of tamsulosin /flomax . 08/27/22   Watt Rush, MD  tamsulosin  (FLOMAX ) 0.4 MG CAPS capsule Take 2 capsules (0.8 mg total) by mouth daily. 03/11/23   Watt Rush, MD  zolpidem (AMBIEN) 10 MG tablet Take by mouth. 01/07/22   [provider]     No family history on file.  Social History   Socioeconomic History   Marital status: Married    Spouse name: Not on file   Number of children: Not on file   Years of education: Not on file   Highest education level: Not on file  Occupational History   Not on file  Tobacco Use   Smoking status: Former    Current packs/day: 0.00    Types: Cigarettes    Quit date: 02/24/1978    Years since quitting: 45.6   Smokeless tobacco: Never  Vaping Use   Vaping status: Never Used  Substance and Sexual Activity   Alcohol use: Yes   Drug use: No   Sexual activity: Yes  Other Topics Concern   Not on file  Social History Narrative   Not on file   Social Drivers of Health   Financial Resource Strain: Not on file  Food Insecurity: Not on file  Transportation Needs: Not on file  Physical Activity: Not on file  Stress: Not on file  Social Connections: Unknown (03/03/2022)   [facility] from [facility]   Social Network    Social Network: Not on file     Review of Systems see  above  Vital Signs: Vitals:   10/26/23 0955  BP: (!) 141/76  Pulse: 83  Resp: 20  Temp: 98.4 F (36.9 C)  SpO2: 97%     Advance Care Plan: no documents on file    Physical Exam: awake/alert; chest- CTA bilat; heart- RRR; abd-soft,+BS,NT; trace pretibial edema bilat  Imaging: No results found.  Labs:  CBC: No results for input(s): WBC, HGB, HCT, PLT in the last 8760 hours.  COAGS: No results for input(s): INR, APTT in the last 8760 hours.  BMP: No results for input(s): NA, K, CL, CO2, GLUCOSE, BUN, CALCIUM, CREATININE, GFRNONAA, GFRAA in the last 8760 hours.  Invalid input(s): CMP  LIVER FUNCTION TESTS: No results for input(s): BILITOT, AST, ALT, ALKPHOS, PROT, ALBUMIN in the last 8760 hours.  TUMOR MARKERS: No results for input(s): AFPTM, CEA, CA199, CHROMGRNA in the last 8760 hours.  Assessment and Plan: Per. Dr. Thelda progress note on 11/20:  Matthew Payne is a 78 y.o. male w/ PMHx significant for Peyronie dx s/p phalloplasty in 2008, prostate CA on surveillance (Gleason 3+3=6 w PNI, 01/30/21), and long standing BPH. Pt is closely followed by his urologist, Dr Watt, and is on medical management with  tamsulosin  0.4 mg qHS. Pt was last seen in urological follow up on 09/02/23 and reported BOO symptoms. He was interested in a minimally invasive option and was referred for evaluation. His most recent imaging w MR Prostate (02/24/23) demonstrated prostatomegaly ~130g. He denies prior catheterization or hematuria.   PSA, last 7.1 (09/02/23) IPSS Score Total, 12 pts (Moderate symptoms) QoL dissatisfied, on tamsulosin  0.4 mg qD  *Same day procedure, no overnight admission. Procedure to be performed at [facility] *Pt habitus < 6 ft, will plan for trans-radial access. *Rocephin  for pre op Abx *CBC, BMP, Coags to be drawn on procedure day  CTA Pelvis w/ contrast on 09/26/23: IMPRESSION: VASCULAR  1. The right  prostatic artery arises from the common origin of the internal pudendal and inferior gluteal arteries (Car[place] type II) 2. The left prostatic artery arises from the left obturator artery ([place] type III). 3.  Aortic Atherosclerosis (ICD10-I70.0).   NON-VASCULAR Prostatomegaly (148 g).  Patient presents for scheduled prostate artery embolization in IR today.  Risks and benefits of prostate artery embolization were discussed with the patient including, but not limited to bleeding, infection, vascular injury or contrast induced renal failure.  This interventional procedure involves the use of X-rays and because of the nature of the planned procedure, it is possible that we will have prolonged use of X-ray fluoroscopy.  Potential radiation risks to you include (but are not limited to) the following: - A slightly elevated risk for cancer several years later in life. This risk is typically less than 0.5% percent. This risk is low in comparison to the normal incidence of human cancer, which is 33% for women and 50% for men according to the American Cancer Society. - Radiation induced injury can include skin redness, resembling a rash, tissue breakdown / ulcers and hair loss (which can be temporary or permanent).   The likelihood of either of these occurring depends on the difficulty of the procedure and whether you are sensitive to radiation due to previous procedures, disease, or genetic conditions.   IF your procedure requires a prolonged use of radiation, you will be notified and given written instructions for further action.  It is your responsibility to monitor the irradiated area for the 2 weeks following the procedure and to notify your physician if you are concerned that you have suffered a radiation induced injury.    All of the patient's questions were answered, patient is agreeable to proceed.  Consent signed and in chart.    Thank you for this interesting consult.  I greatly  enjoyed meeting JAQUAVIAN Payne and look forward to participating in their care.  A copy of this report was sent to the requesting provider on this date.  Electronically Signed: Carlin DELENA Griffon, PA-C/Kevin Everlynn Sagun,PA-C 10/25/2023, 10:58 AM   I spent a total of  25 minutes   in face to face in clinical consultation, greater than 50% of which was counseling/coordinating care for prostate artery embolization.

## 2023-10-26 ENCOUNTER — Other Ambulatory Visit (HOSPITAL_COMMUNITY): Payer: Self-pay

## 2023-10-26 ENCOUNTER — Encounter (HOSPITAL_COMMUNITY): Payer: Self-pay

## 2023-10-26 ENCOUNTER — Other Ambulatory Visit (HOSPITAL_COMMUNITY): Payer: Self-pay | Admitting: Interventional Radiology

## 2023-10-26 ENCOUNTER — Ambulatory Visit (HOSPITAL_COMMUNITY)
Admission: RE | Admit: 2023-10-26 | Discharge: 2023-10-26 | Disposition: A | Discharge status: Home / Self Care | Payer: Medicare Other | Source: Ambulatory Visit | Attending: Interventional Radiology | Admitting: Interventional Radiology

## 2023-10-26 DIAGNOSIS — N403 Nodular prostate with lower urinary tract symptoms: Secondary | ICD-10-CM | POA: Insufficient documentation

## 2023-10-26 DIAGNOSIS — I10 Essential (primary) hypertension: Secondary | ICD-10-CM | POA: Insufficient documentation

## 2023-10-26 DIAGNOSIS — E785 Hyperlipidemia, unspecified: Secondary | ICD-10-CM | POA: Insufficient documentation

## 2023-10-26 DIAGNOSIS — Z87891 Personal history of nicotine dependence: Secondary | ICD-10-CM | POA: Diagnosis not present

## 2023-10-26 DIAGNOSIS — Z8546 Personal history of malignant neoplasm of prostate: Secondary | ICD-10-CM | POA: Insufficient documentation

## 2023-10-26 DIAGNOSIS — Z79899 Other long term (current) drug therapy: Secondary | ICD-10-CM | POA: Diagnosis not present

## 2023-10-26 DIAGNOSIS — N320 Bladder-neck obstruction: Secondary | ICD-10-CM | POA: Diagnosis not present

## 2023-10-26 DIAGNOSIS — N401 Enlarged prostate with lower urinary tract symptoms: Secondary | ICD-10-CM | POA: Insufficient documentation

## 2023-10-26 HISTORY — PX: IR US GUIDE VASC ACCESS LEFT: IMG2389

## 2023-10-26 HISTORY — PX: IR ANGIOGRAM SELECTIVE EACH ADDITIONAL VESSEL: IMG667

## 2023-10-26 HISTORY — PX: IR EMBO TUMOR ORGAN ISCHEMIA INFARCT INC GUIDE ROADMAPPING: IMG5449

## 2023-10-26 HISTORY — PX: IR 3D INDEPENDENT WKST: IMG2385

## 2023-10-26 HISTORY — PX: IR ANGIOGRAM PELVIS SELECTIVE OR SUPRASELECTIVE: IMG661

## 2023-10-26 LAB — CBC WITH DIFFERENTIAL/PLATELET
Abs Immature Granulocytes: 0.02 10*3/uL (ref 0.00–0.07)
Basophils Absolute: 0 10*3/uL (ref 0.0–0.1)
Basophils Relative: 1 %
Eosinophils Absolute: 0.1 10*3/uL (ref 0.0–0.5)
Eosinophils Relative: 2 %
HCT: 44 % (ref 39.0–52.0)
Hemoglobin: 14.7 g/dL (ref 13.0–17.0)
Immature Granulocytes: 0 %
Lymphocytes Relative: 34 %
Lymphs Abs: 2.1 10*3/uL (ref 0.7–4.0)
MCH: 31.3 pg (ref 26.0–34.0)
MCHC: 33.4 g/dL (ref 30.0–36.0)
MCV: 93.6 fL (ref 80.0–100.0)
Monocytes Absolute: 0.6 10*3/uL (ref 0.1–1.0)
Monocytes Relative: 9 %
Neutro Abs: 3.4 10*3/uL (ref 1.7–7.7)
Neutrophils Relative %: 54 %
Platelets: 168 10*3/uL (ref 150–400)
RBC: 4.7 MIL/uL (ref 4.22–5.81)
RDW: 12.9 % (ref 11.5–15.5)
WBC: 6.2 10*3/uL (ref 4.0–10.5)
nRBC: 0 % (ref 0.0–0.2)

## 2023-10-26 LAB — BASIC METABOLIC PANEL
Anion gap: 7 (ref 5–15)
BUN: 19 mg/dL (ref 8–23)
CO2: 24 mmol/L (ref 22–32)
Calcium: 9.9 mg/dL (ref 8.9–10.3)
Chloride: 107 mmol/L (ref 98–111)
Creatinine, Ser: 0.72 mg/dL (ref 0.61–1.24)
GFR, Estimated: >60 mL/min (ref >60)
Glucose, Bld: 133 mg/dL — ABNORMAL HIGH (ref 70–99)
Potassium: 4 mmol/L (ref 3.5–5.1)
Sodium: 138 mmol/L (ref 135–145)

## 2023-10-26 LAB — PROTIME-INR
INR: 1 (ref 0.8–1.2)
Prothrombin Time: 13.5 s (ref 11.4–15.2)

## 2023-10-26 MED ORDER — MIDAZOLAM HCL 2 MG/2ML IJ SOLN
INTRAMUSCULAR | Status: AC
Start: 2023-10-26 — End: ?
  Filled 2023-10-26: qty 4

## 2023-10-26 MED ORDER — METHYLPREDNISOLONE 4 MG PO TBPK
ORAL_TABLET | ORAL | 0 refills | Status: DC
  Filled 2023-10-26: qty 21, 6d supply, fill #0

## 2023-10-26 MED ORDER — DEXAMETHASONE SODIUM PHOSPHATE 10 MG/ML IJ SOLN
8.0000 mg | Freq: Once | INTRAMUSCULAR | Status: AC
  Administered 2023-10-26: 8 mg via INTRAVENOUS
  Filled 2023-10-26: qty 1

## 2023-10-26 MED ORDER — SODIUM CHLORIDE 0.9% FLUSH: 3.0000 mL | INTRAVENOUS | Status: DC | PRN

## 2023-10-26 MED ORDER — IOHEXOL 300 MG/ML  SOLN
100.0000 mL | Freq: Once | INTRAMUSCULAR | Status: AC | PRN
  Administered 2023-10-26: 25 mL via INTRA_ARTERIAL

## 2023-10-26 MED ORDER — SODIUM CHLORIDE 0.9 % IV SOLN
2.0000 g | Freq: Once | INTRAVENOUS | Status: AC
  Administered 2023-10-26: 2 g via INTRAVENOUS
  Filled 2023-10-26: qty 20

## 2023-10-26 MED ORDER — MIDAZOLAM HCL 2 MG/2ML IJ SOLN
INTRAMUSCULAR | Status: AC | PRN
  Administered 2023-10-26: 1 mg via INTRAVENOUS

## 2023-10-26 MED ORDER — KETOROLAC TROMETHAMINE 30 MG/ML IJ SOLN
INTRAMUSCULAR | Status: AC
  Filled 2023-10-26: qty 1

## 2023-10-26 MED ORDER — NAPROXEN 250 MG PO TABS
500.0000 mg | ORAL_TABLET | Freq: Once | ORAL | Status: AC
  Administered 2023-10-26: 500 mg via ORAL
  Filled 2023-10-26 (×2): qty 2

## 2023-10-26 MED ORDER — HEPARIN SODIUM (PORCINE) 1000 UNIT/ML IJ SOLN
INTRAMUSCULAR | Status: AC
  Filled 2023-10-26: qty 10

## 2023-10-26 MED ORDER — SOLIFENACIN SUCCINATE 5 MG PO TABS
5.0000 mg | ORAL_TABLET | Freq: Every day | ORAL | 0 refills | Status: DC
  Filled 2023-10-26: qty 7, 7d supply, fill #0

## 2023-10-26 MED ORDER — LIDOCAINE HCL (PF) 1 % IJ SOLN
INTRAMUSCULAR | Status: AC
  Filled 2023-10-26: qty 30

## 2023-10-26 MED ORDER — VERAPAMIL HCL 2.5 MG/ML IV SOLN
INTRAVENOUS | Status: AC
  Filled 2023-10-26: qty 2

## 2023-10-26 MED ORDER — PHENAZOPYRIDINE HCL 100 MG PO TABS
100.0000 mg | ORAL_TABLET | Freq: Three times a day (TID) | ORAL | 0 refills | Status: DC | PRN
Start: 2023-10-26 — End: 2023-12-21
  Filled 2023-10-26: qty 21, 7d supply, fill #0

## 2023-10-26 MED ORDER — KETOROLAC TROMETHAMINE 15 MG/ML IJ SOLN
INTRAMUSCULAR | Status: AC | PRN
  Administered 2023-10-26 (×2): 15 mg via INTRAVENOUS

## 2023-10-26 MED ORDER — IOHEXOL 300 MG/ML  SOLN
100.0000 mL | Freq: Once | INTRAMUSCULAR | Status: AC | PRN
  Administered 2023-10-26: 50 mL via INTRA_ARTERIAL

## 2023-10-26 MED ORDER — VERAPAMIL HCL 2.5 MG/ML IV SOLN
INTRA_ARTERIAL | Status: AC | PRN
  Administered 2023-10-26: 6 mL via INTRA_ARTERIAL

## 2023-10-26 MED ORDER — FENTANYL CITRATE (PF) 100 MCG/2ML IJ SOLN
INTRAMUSCULAR | Status: AC | PRN
  Administered 2023-10-26 (×2): 25 ug via INTRAVENOUS

## 2023-10-26 MED ORDER — SODIUM CHLORIDE 0.9 % IV SOLN: INTRAVENOUS | Status: DC

## 2023-10-26 MED ORDER — MIDAZOLAM HCL 2 MG/2ML IJ SOLN
INTRAMUSCULAR | Status: AC | PRN
  Administered 2023-10-26: .5 mg via INTRAVENOUS

## 2023-10-26 MED ORDER — FENTANYL CITRATE (PF) 100 MCG/2ML IJ SOLN
INTRAMUSCULAR | Status: AC | PRN
  Administered 2023-10-26: 50 ug via INTRAVENOUS

## 2023-10-26 MED ORDER — NITROGLYCERIN IN D5W 100-5 MCG/ML-% IV SOLN
INTRAVENOUS | Status: AC
  Filled 2023-10-26: qty 250

## 2023-10-26 MED ORDER — FENTANYL CITRATE (PF) 100 MCG/2ML IJ SOLN
INTRAMUSCULAR | Status: AC
  Filled 2023-10-26: qty 2

## 2023-10-26 MED ORDER — CIPROFLOXACIN HCL 500 MG PO TABS
500.0000 mg | ORAL_TABLET | Freq: Two times a day (BID) | ORAL | 0 refills | Status: DC
  Filled 2023-10-26: qty 14, 7d supply, fill #0

## 2023-10-26 MED ORDER — LIDOCAINE HCL (PF) 1 % IJ SOLN
30.0000 mL | Freq: Once | INTRAMUSCULAR | Status: AC
  Administered 2023-10-26: 3 mL via INTRADERMAL

## 2023-10-26 MED ORDER — IOHEXOL 300 MG/ML  SOLN
50.0000 mL | Freq: Once | INTRAMUSCULAR | Status: AC | PRN
  Administered 2023-10-26: 25 mL via INTRA_ARTERIAL

## 2023-10-26 NOTE — Progress Notes (Signed)
 1620 Dr. Milford Cage in to see before discharge.

## 2023-10-26 NOTE — Discharge Instructions (Addendum)
 Discharge Instructions:   Please call Interventional Radiology clinic [phone] with any questions or concerns.  You may remove your dressing and shower in 24 hours

## 2023-10-26 NOTE — Procedures (Signed)
 Vascular and Interventional Radiology Procedure Note  Patient: Matthew Payne DOB: August 23, 1946 Medical Record Number: [id number] Note Date/Time: 10/26/23 11:57 AM   Performing Physician: Thom Hall, MD Assistant(s): None  Diagnosis: BPH w LUTS   Procedure(s):  PELVIC ARTERIOGRAPHY PROSTATE ARTERY EMBOLIZATION    Anesthesia: Conscious Sedation Complications: None Estimated Blood Loss: Minimal Specimens: None   Findings:  - access via the LEFT radial artery. - Prostatomegaly with bilateral tortuous prostatic arteries.  - Successful 100-300 and 300-500 um microparticle embolization of bilateral prostatic arteries to stasis - Additional coil embolization of the prostatic arteries with 1 and 2 mm microcoils - TR band closure at L wrist at the end of the case   Plan: - Post sheath removal precautions.  - Standard post radial access deflation protocol.   Final report to follow once all images are reviewed and compared with previous studies.  See detailed dictation with images in PACS. The patient tolerated the procedure well without incident or complication and was returned to Recovery in stable condition.   Thom Hall, MD [facility] Radiology   Pager. [phone] Clinic. [phone]

## 2023-12-05 ENCOUNTER — Encounter (HOSPITAL_BASED_OUTPATIENT_CLINIC_OR_DEPARTMENT_OTHER): Payer: Self-pay

## 2023-12-05 ENCOUNTER — Emergency Department (HOSPITAL_BASED_OUTPATIENT_CLINIC_OR_DEPARTMENT_OTHER)
Admission: EM | Admit: 2023-12-05 | Discharge: 2023-12-05 | Disposition: A | Discharge status: Home / Self Care | Payer: Medicare Other | Attending: Emergency Medicine | Admitting: Emergency Medicine

## 2023-12-05 ENCOUNTER — Emergency Department (HOSPITAL_BASED_OUTPATIENT_CLINIC_OR_DEPARTMENT_OTHER): Payer: Medicare Other

## 2023-12-05 ENCOUNTER — Other Ambulatory Visit: Payer: Self-pay

## 2023-12-05 DIAGNOSIS — K4091 Unilateral inguinal hernia, without obstruction or gangrene, recurrent: Secondary | ICD-10-CM | POA: Diagnosis not present

## 2023-12-05 DIAGNOSIS — N23 Unspecified renal colic: Secondary | ICD-10-CM | POA: Diagnosis not present

## 2023-12-05 DIAGNOSIS — R109 Unspecified abdominal pain: Secondary | ICD-10-CM | POA: Diagnosis present

## 2023-12-05 DIAGNOSIS — N50812 Left testicular pain: Secondary | ICD-10-CM | POA: Insufficient documentation

## 2023-12-05 DIAGNOSIS — N201 Calculus of ureter: Secondary | ICD-10-CM | POA: Diagnosis not present

## 2023-12-05 DIAGNOSIS — K5730 Diverticulosis of large intestine without perforation or abscess without bleeding: Secondary | ICD-10-CM | POA: Diagnosis not present

## 2023-12-05 DIAGNOSIS — K4090 Unilateral inguinal hernia, without obstruction or gangrene, not specified as recurrent: Secondary | ICD-10-CM | POA: Diagnosis not present

## 2023-12-05 LAB — CBC
HCT: 44.3 % (ref 39.0–52.0)
Hemoglobin: 14.6 g/dL (ref 13.0–17.0)
MCH: 30.4 pg (ref 26.0–34.0)
MCHC: 33 g/dL (ref 30.0–36.0)
MCV: 92.3 fL (ref 80.0–100.0)
Platelets: 166 10*3/uL (ref 150–400)
RBC: 4.8 MIL/uL (ref 4.22–5.81)
RDW: 13 % (ref 11.5–15.5)
WBC: 9 10*3/uL (ref 4.0–10.5)
nRBC: 0 % (ref 0.0–0.2)

## 2023-12-05 LAB — URINALYSIS, ROUTINE W REFLEX MICROSCOPIC
Bacteria, UA: NONE SEEN
Bilirubin Urine: NEGATIVE
Glucose, UA: NEGATIVE mg/dL
Ketones, ur: 40 mg/dL — AB
Leukocytes,Ua: NEGATIVE
Nitrite: NEGATIVE
Protein, ur: 30 mg/dL — AB
RBC / HPF: >50 RBC/hpf (ref 0–5)
Specific Gravity, Urine: 1.023 (ref 1.005–1.030)
pH: 6 (ref 5.0–8.0)

## 2023-12-05 LAB — COMPREHENSIVE METABOLIC PANEL
ALT: 18 U/L (ref 0–44)
AST: 17 U/L (ref 15–41)
Albumin: 4 g/dL (ref 3.5–5.0)
Alkaline Phosphatase: 65 U/L (ref 38–126)
Anion gap: 9 (ref 5–15)
BUN: 18 mg/dL (ref 8–23)
CO2: 26 mmol/L (ref 22–32)
Calcium: 10.5 mg/dL — ABNORMAL HIGH (ref 8.9–10.3)
Chloride: 105 mmol/L (ref 98–111)
Creatinine, Ser: 0.82 mg/dL (ref 0.61–1.24)
GFR, Estimated: >60 mL/min (ref >60)
Glucose, Bld: 202 mg/dL — ABNORMAL HIGH (ref 70–99)
Potassium: 4.3 mmol/L (ref 3.5–5.1)
Sodium: 140 mmol/L (ref 135–145)
Total Bilirubin: 0.8 mg/dL (ref 0.0–1.2)
Total Protein: 7.1 g/dL (ref 6.5–8.1)

## 2023-12-05 MED ORDER — IBUPROFEN 600 MG PO TABS
600.0000 mg | ORAL_TABLET | Freq: Four times a day (QID) | ORAL | 0 refills | Status: DC | PRN
Start: 2023-12-05 — End: 2024-03-16

## 2023-12-05 MED ORDER — ONDANSETRON 4 MG PO TBDP: 4.0000 mg | ORAL_TABLET | Freq: Three times a day (TID) | ORAL | 0 refills | Status: DC | PRN

## 2023-12-05 MED ORDER — KETOROLAC TROMETHAMINE 15 MG/ML IJ SOLN
15.0000 mg | Freq: Once | INTRAMUSCULAR | Status: AC
  Administered 2023-12-05: 15 mg via INTRAVENOUS
  Filled 2023-12-05: qty 1

## 2023-12-05 MED ORDER — OXYCODONE HCL 5 MG PO TABS: 5.0000 mg | ORAL_TABLET | Freq: Four times a day (QID) | ORAL | 0 refills | Status: DC | PRN

## 2023-12-05 NOTE — ED Provider Notes (Signed)
[place] EMERGENCY DEPARTMENT AT [facility] Provider Note   CSN: [id number] Arrival date & time: 12/05/23  1105     History  Chief Complaint  Patient presents with   Flank Pain   Testicle Pain    Matthew Payne is a 78 y.o. male.  Patient with history of enlarged prostate presents to the emergency department today for evaluation of acute onset left-sided flank pain with radiation to the left testicle.  Patient awoke from sleep with severe pain around 4 AM.  He had associated nausea without vomiting.  States that he has not eaten anything since last night.  Pain was initially left sided abdomen and flank but has settled more in the left testicle currently.  Patient was concerned that he had a hernia.  Again no vomiting.  No dysuria, increase frequency or urgency.  No previous history of kidney stones.  No history of GI bleeding.       Home Medications Prior to Admission medications   Medication Sig Start Date End Date Taking? Authorizing Provider  Bioflavonoid Products (ESTER C PO) Take 1,000 mg by mouth daily.    [provider]  ciprofloxacin (CIPRO) 500 MG tablet Take 1 tablet (500 mg total) by mouth 2 (two) times daily. 10/26/23   Allred, Rosalita Levan, PA-C  diazepam (VALIUM) 5 MG tablet Take 1-2 po one hour prior to MRI 11/26/22   Bjorn Pippin, MD  enalapril (VASOTEC) 10 MG tablet Take 10 mg by mouth daily.    [provider]  methylPREDNISolone (MEDROL DOSEPAK) 4 MG TBPK tablet Take as directed 10/26/23   Allred, Darrell K, PA-C  phenazopyridine (PYRIDIUM) 100 MG tablet Take 1 tablet (100 mg total) by mouth 3 (three) times daily as needed for pain. 10/26/23   Allred, Darrell K, PA-C  pravastatin (PRAVACHOL) 40 MG tablet Take 40 mg by mouth daily. 09/30/19   [provider]  sildenafil (REVATIO) 20 MG tablet Take 2-3 tabs po prn in combination with 10mg  tadalafil 08/27/22   Bjorn Pippin, MD  solifenacin (VESICARE) 5 MG tablet Take 1 tablet (5 mg total)  by mouth daily. 10/26/23   Allred, Darrell K, PA-C  tadalafil (CIALIS) 5 MG tablet Take up to 4 tablets po as needed daily or up to 2 tablets daily po with 2-3 20mg  sildenafil as needed for sexual activity.  Do not take within 4 hours of tamsulosin/flomax. 08/27/22   Bjorn Pippin, MD  tamsulosin (FLOMAX) 0.4 MG CAPS capsule Take 2 capsules (0.8 mg total) by mouth daily. 03/11/23   Bjorn Pippin, MD  zolpidem Remus Loffler) 10 MG tablet Take by mouth. 01/07/22   [provider]      Allergies    Patient has no known allergies.    Review of Systems   Review of Systems  Physical Exam Updated Vital Signs BP 133/61 (BP Location: Right Arm)   Pulse 87   Temp (!) 97 F (36.1 C)   Resp 16   Ht 6' (1.829 m)   Wt 83.9 kg   SpO2 95%   BMI 25.09 kg/m   Physical Exam Vitals and nursing note reviewed.  Constitutional:      General: He is in acute distress.     Appearance: He is well-developed.  HENT:     Head: Normocephalic and atraumatic.  Eyes:     General:        Right eye: No discharge.        Left eye: No discharge.  Conjunctiva/sclera: Conjunctivae normal.  Cardiovascular:     Rate and Rhythm: Normal rate and regular rhythm.     Heart sounds: Normal heart sounds.  Pulmonary:     Effort: Pulmonary effort is normal. No respiratory distress.     Breath sounds: Normal breath sounds.  Abdominal:     Palpations: Abdomen is soft.     Tenderness: There is no abdominal tenderness. There is no guarding or rebound.  Genitourinary:    Comments: Normal external GU exam.  No large hernias palpated.  No scrotal swelling. Musculoskeletal:     Cervical back: Normal range of motion and neck supple.  Skin:    General: Skin is warm and dry.  Neurological:     Mental Status: He is alert.     ED Results / Procedures / Treatments   Labs (all labs ordered are listed, but only abnormal results are displayed) Labs Reviewed  URINALYSIS, ROUTINE W REFLEX MICROSCOPIC - Abnormal; Notable for the  following components:      Result Value   APPearance HAZY (*)    Hgb urine dipstick LARGE (*)    Ketones, ur 40 (*)    Protein, ur 30 (*)    All other components within normal limits  COMPREHENSIVE METABOLIC PANEL - Abnormal; Notable for the following components:   Glucose, Bld 202 (*)    Calcium 10.5 (*)    All other components within normal limits  CBC    EKG None  Radiology CT Renal Stone Study Result Date: 12/05/2023 CLINICAL DATA:  Left flank pain and left groin pain EXAM: CT ABDOMEN AND PELVIS WITHOUT CONTRAST TECHNIQUE: Multidetector CT imaging of the abdomen and pelvis was performed following the standard protocol without IV contrast. RADIATION DOSE REDUCTION: This exam was performed according to the departmental dose-optimization program which includes automated exposure control, adjustment of the mA and/or kV according to patient size and/or use of iterative reconstruction technique. COMPARISON:  04/25/2018 FINDINGS: Lower chest: No acute abnormality.  Coronary artery atherosclerosis. Hepatobiliary: No focal liver abnormality is seen. Status post cholecystectomy. No biliary dilatation. Pancreas: Unremarkable. No pancreatic ductal dilatation or surrounding inflammatory changes. Spleen: Scattered granulomas.  No splenomegaly. Adrenals/Urinary Tract: Unremarkable adrenal glands. No solid renal mass, stone, or hydronephrosis. There is a 4 mm stone in the distal left ureter at the level of the pelvic sidewall. Urinary bladder within normal limits for the degree of distension. Stomach/Bowel: Stomach is within normal limits. Appendix appears normal. No evidence of bowel wall thickening, distention, or inflammatory changes. Scattered colonic diverticulosis. Vascular/Lymphatic: Aortic atherosclerosis. No enlarged abdominal or pelvic lymph nodes. Reproductive: Prostatomegaly. Other: Fat containing left inguinal hernia. The adjacent sigmoid colon partially protrudes into the hernia sac at the  hernia neck. No free air or free fluid. Musculoskeletal: No acute or significant osseous findings. IMPRESSION: 1. There is a 4 mm stone in the distal left ureter at the level of the pelvic sidewall. No hydronephrosis. 2. Fat containing left inguinal hernia. The adjacent sigmoid colon partially protrudes into the hernia sac without obstruction. 3. Colonic diverticulosis without evidence of acute diverticulitis. 4. Prostatomegaly. 5. Aortic atherosclerosis (ICD10-I70.0). Electronically Signed   By: Duanne Guess D.O.   On: 12/05/2023 13:53    Procedures Procedures    Medications Ordered in ED Medications  ketorolac (TORADOL) 15 MG/ML injection 15 mg (15 mg Intravenous Given 12/05/23 1254)    ED Course/ Medical Decision Making/ A&P    Patient seen and examined. History obtained directly from patient.   Labs/EKG: Personally  reviewed and interpreted Labs ordered in triage including: CBC unremarkable; CMP glucose elevated at 202, calcium slightly elevated 10.5 otherwise unremarkable; UA with hematuria, small amount of white cells, yeast present.  Imaging: Ordered CT renal protocol without contrast  Medications/Fluids: Ordered: IV Toradol  Most recent vital signs reviewed and are as follows: BP 133/61 (BP Location: Right Arm)   Pulse 87   Temp (!) 97 F (36.1 C)   Resp 16   Ht 6' (1.829 m)   Wt 83.9 kg   SpO2 95%   BMI 25.09 kg/m   Initial impression: Left-sided flank pain and testicular pain, concerning for ureteral colic.    Reassessment performed. Patient appears much more comfortable.  Pain resolved after IV Toradol.  Imaging personally visualized and interpreted including: CT renal, agree 4 mm distal ureteral stone, consistent with patient's symptoms.  Reviewed pertinent lab work and imaging with patient at bedside. Questions answered.   Most current vital signs reviewed and are as follows: BP (!) 152/78   Pulse 85   Temp (!) 97 F (36.1 C)   Resp 16   Ht 6' (1.829  m)   Wt 83.9 kg   SpO2 99%   BMI 25.09 kg/m   Plan: Discharge to home.   Prescriptions written for: Oxycodone, ibuprofen, Zofran  Patient counseled on kidney stone treatment. Urged patient to strain urine and save any stones. Urged urology follow-up and return to [facility] with any complications. Counseled patient to maintain good fluid intake.   Patient counseled on use of narcotic pain medications. Counseled not to combine these medications with others containing tylenol. Urged not to drink alcohol, drive, or perform any other activities that requires focus while taking these medications. The patient verbalizes understanding and agrees with the plan.                                Medical Decision Making Amount and/or Complexity of Data Reviewed Labs: ordered. Radiology: ordered.  Risk Prescription drug management.   For this patient's complaint of abdominal pain, the following conditions were considered on the differential diagnosis: gastritis/PUD, enteritis/duodenitis, appendicitis, cholelithiasis/cholecystitis, cholangitis, pancreatitis, ruptured viscus, colitis, diverticulitis, small/large bowel obstruction, proctitis, cystitis, pyelonephritis, ureteral colic, aortic dissection, aortic aneurysm. Atypical chest etiologies were also considered including ACS, PE, and pneumonia.  UA with hematuria and CT consistent with ureteral stone.  Patient symptoms controlled.  Low concern for dissection or aneurysm in the abdomen.  The patient's vital signs, pertinent lab work and imaging were reviewed and interpreted as discussed in the ED course. Hospitalization was considered for further testing, treatments, or serial exams/observation. However as patient is well-appearing, has a stable exam, and reassuring studies today, I do not feel that they warrant admission at this time. This plan was discussed with the patient who verbalizes agreement and comfort with this plan and seems reliable and able to  return to the Emergency Department with worsening or changing symptoms.            Final Clinical Impression(s) / ED Diagnoses Final diagnoses:  Ureteral colic  Unilateral recurrent inguinal hernia without obstruction or gangrene    Rx / DC Orders ED Discharge Orders          Ordered    oxyCODONE (OXY IR/ROXICODONE) 5 MG immediate release tablet  Every 6 hours PRN        12/05/23 1429    ibuprofen (ADVIL) 600 MG tablet  Every 6  hours PRN        12/05/23 1429    ondansetron (ZOFRAN-ODT) 4 MG disintegrating tablet  Every 8 hours PRN        12/05/23 1429              Renne Crigler, PA-C 12/05/23 1741    Derwood Kaplan, MD 12/06/23 1327

## 2023-12-05 NOTE — Discharge Instructions (Addendum)
Please read and follow all provided instructions.  Your diagnoses today include:  1. Ureteral colic     Tests performed today include: Urine test that showed blood in your urine and no infection CT scan which showed a 4 millimeter kidney stone on the left side, also shows a inguinal hernia which is not likely contributing to your pain today  Bblood test that showed normal kidney function Vital signs. See below for your results today.   Medications prescribed:  Oxycodone - narcotic pain medication  DO NOT drive or perform any activities that require you to be awake and alert because this medicine can make you drowsy.   Zofran (ondansetron) - for nausea and vomiting  Ibuprofen (Motrin, Advil) - anti-inflammatory pain medication Do not exceed 600mg  ibuprofen every 6 hours, take with food  You have been prescribed an anti-inflammatory medication or NSAID. Take with food. Take smallest effective dose for the shortest duration needed for your pain. Stop taking if you experience stomach pain or vomiting.   Take any prescribed medications only as directed.  Home care instructions:  Follow any educational materials contained in this packet.  Please double your fluid intake for the next several days. Strain your urine and save any stones that may pass.   BE VERY CAREFUL not to take multiple medicines containing Tylenol (also called acetaminophen). Doing so can lead to an overdose which can damage your liver and cause liver failure and possibly death.   Follow-up instructions: Please follow-up with your urologist or the urologist referral (provided on front page) in the next 1 week for further evaluation of your symptoms.  Follow-up with your general surgeon for evaluation of the hernia if needed.  Return instructions:  If you need to return to the Emergency Department, go to [facility]. The urologists are located at [facility] and can better care for you at this  location.  Please return to the Emergency Department if you experience worsening symptoms.  Please return if you develop fever or uncontrolled pain or vomiting. Please return if you have any other emergent concerns.  Additional Information:  Your vital signs today were: BP 133/61 (BP Location: Right Arm)   Pulse 87   Temp (!) 97 F (36.1 C)   Resp 16   Ht 6' (1.829 m)   Wt 83.9 kg   SpO2 95%   BMI 25.09 kg/m  If your blood pressure (BP) was elevated above 135/85 this visit, please have this repeated by your doctor within one month. --------------

## 2023-12-05 NOTE — ED Triage Notes (Addendum)
Patient presents with L flank pain and L testicle pain that started suddenly this morning. Denies dysuria, fever, chills. HX of inguinal hernia.

## 2023-12-08 ENCOUNTER — Ambulatory Visit
Admission: RE | Admit: 2023-12-08 | Discharge: 2023-12-08 | Disposition: A | Discharge status: Home / Self Care | Payer: Medicare Other | Source: Ambulatory Visit | Attending: Radiology | Admitting: Radiology

## 2023-12-08 DIAGNOSIS — N401 Enlarged prostate with lower urinary tract symptoms: Secondary | ICD-10-CM

## 2023-12-08 HISTORY — PX: IR RADIOLOGIST EVAL & MGMT: IMG5224

## 2023-12-08 NOTE — Progress Notes (Addendum)
 Reason for visit: BPH with LUTS.  Post prostate embolization.   Care Team(s): Primary Care: Assunta Found, MD Urology: Bjorn Pippin, MD   Virtual Visit via Telephone Note   I connected with Matthew Payne on 12/08/23 by telephone and verified that I am speaking with the correct person using two identifiers. I discussed the limitations, risks, security and privacy concerns of performing an evaluation and management service by telephone and the availability of in-person appointments.   History of Present Illness:   Matthew Payne is a 78 y.o. male w/ PMHx significant for Peyronie dx s/p phalloplasty in 2008, prostate CA on surveillance (Gleason 3+3=6 w PNI, 01/30/21), and long standing BPH. Pt is closely followed by his urologist, Dr Annabell Howells, and is on medical management with  tamsulosin 0.4 mg qHS. Pt was last seen in urological follow up on 09/02/23 and reported BOO symptoms. He was interested in a minimally invasive option and was referred for evaluation. MR Prostate (02/24/23) demonstrated prostatomegaly ~130g. He denied prior catheterization.  He underwent prostate artery embolization on 10/26/23. He reports noticing "expected swelling" but denied much discomfort after the procedure. He has noticed that his urinary symptoms have improved. He remains on tamsulosin 0.4 mg qD, and his QoL is much improved.  ROS positive for recent ER visit (12/05/23) for a symptomatic L renal stone.    I used a International Prostatism Symptom Score* (IPSS) Score to quantify his symptoms : 3 points, previously 12 pts (pre PAE)   *Gery Pray MJ, Melene Muller MP, et al. The American Urological Association symptom index for benign prostatic hyperplasia. The Measurement Committee of the American Urological Association. J Urol [id number]; 161:0960.   Review of Systems: A 12 point ROS discussed and pertinent positives are indicated in the HPI above.  All other systems are negative.    Past Medical History:   Diagnosis Date   Hypercholesteremia    Hypertension     Past Surgical History:  Procedure Laterality Date   CATARACT EXTRACTION, BILATERAL  5 yrs ago   COLONOSCOPY  05/28/2011   Procedure: COLONOSCOPY;  Surgeon: Malissa Hippo, MD;  Location: [facility];  Service: Endoscopy;  Laterality: N/A;   COLONOSCOPY N/A 07/31/2021   Procedure: COLONOSCOPY;  Surgeon: Malissa Hippo, MD;  Location: [facility];  Service: Endoscopy;  Laterality: N/A;  13:00   CYSTECTOMY     back & leg   INGUINAL HERNIA REPAIR  07/01/2012   Procedure: HERNIA REPAIR INGUINAL ADULT;  Surgeon: Dalia Heading, MD;  Location: [facility] ORS;  Service: General;  Laterality: Right;  Right Inguinal Herniorraphy with Mesh   IR 3D INDEPENDENT WKST  10/26/2023   IR ANGIOGRAM PELVIS SELECTIVE OR SUPRASELECTIVE  10/26/2023   IR ANGIOGRAM SELECTIVE EACH ADDITIONAL VESSEL  10/26/2023   IR ANGIOGRAM SELECTIVE EACH ADDITIONAL VESSEL  10/26/2023   IR EMBO TUMOR ORGAN ISCHEMIA INFARCT INC GUIDE ROADMAPPING  10/26/2023   IR RADIOLOGIST EVAL & MGMT  09/08/2023   IR US GUIDE VASC ACCESS LEFT  10/26/2023   IR US GUIDE VASC ACCESS LEFT  10/26/2023   IR US GUIDE VASC ACCESS LEFT  10/26/2023   Nesbit  Plication     POLYPECTOMY  07/31/2021   Procedure: POLYPECTOMY;  Surgeon: Malissa Hippo, MD;  Location: [facility];  Service: Endoscopy;;   TONSILLECTOMY      Allergies: Patient has no known allergies.  Medications: Prior to Admission medications   Medication Sig Start Date End Date  Taking? Authorizing Provider  Bioflavonoid Products (ESTER C PO) Take 1,000 mg by mouth daily.    [provider]  ciprofloxacin (CIPRO) 500 MG tablet Take 1 tablet (500 mg total) by mouth 2 (two) times daily. 10/26/23   Allred, Rosalita Levan, PA-C  diazepam (VALIUM) 5 MG tablet Take 1-2 po one hour prior to MRI 11/26/22   Bjorn Pippin, MD  enalapril (VASOTEC) 10 MG tablet Take 10 mg by mouth daily.    [provider]  ibuprofen (ADVIL) 600 MG tablet Take 1  tablet (600 mg total) by mouth every 6 (six) hours as needed. 12/05/23   Renne Crigler, PA-C  methylPREDNISolone (MEDROL DOSEPAK) 4 MG TBPK tablet Take as directed 10/26/23   Allred, Darrell K, PA-C  ondansetron (ZOFRAN-ODT) 4 MG disintegrating tablet Take 1 tablet (4 mg total) by mouth every 8 (eight) hours as needed for nausea or vomiting. 12/05/23   Renne Crigler, PA-C  oxyCODONE (OXY IR/ROXICODONE) 5 MG immediate release tablet Take 1 tablet (5 mg total) by mouth every 6 (six) hours as needed for severe pain (pain score 7-10). 12/05/23   Renne Crigler, PA-C  phenazopyridine (PYRIDIUM) 100 MG tablet Take 1 tablet (100 mg total) by mouth 3 (three) times daily as needed for pain. 10/26/23   Allred, Darrell K, PA-C  pravastatin (PRAVACHOL) 40 MG tablet Take 40 mg by mouth daily. 09/30/19   [provider]  sildenafil (REVATIO) 20 MG tablet Take 2-3 tabs po prn in combination with 10mg  tadalafil 08/27/22   Bjorn Pippin, MD  solifenacin (VESICARE) 5 MG tablet Take 1 tablet (5 mg total) by mouth daily. 10/26/23   Allred, Darrell K, PA-C  tadalafil (CIALIS) 5 MG tablet Take up to 4 tablets po as needed daily or up to 2 tablets daily po with 2-3 20mg  sildenafil as needed for sexual activity.  Do not take within 4 hours of tamsulosin/flomax. 08/27/22   Bjorn Pippin, MD  tamsulosin (FLOMAX) 0.4 MG CAPS capsule Take 2 capsules (0.8 mg total) by mouth daily. 03/11/23   Bjorn Pippin, MD  zolpidem Remus Loffler) 10 MG tablet Take by mouth. 01/07/22   [provider]     No family history on file.  Social History   Socioeconomic History   Marital status: Married    Spouse name: Not on file   Number of children: Not on file   Years of education: Not on file   Highest education level: Not on file  Occupational History   Not on file  Tobacco Use   Smoking status: Former    Current packs/day: 0.00    Types: Cigarettes    Quit date: 02/24/1978    Years since quitting: 45.8   Smokeless tobacco: Never   Vaping Use   Vaping status: Never Used  Substance and Sexual Activity   Alcohol use: Yes   Drug use: No   Sexual activity: Yes  Other Topics Concern   Not on file  Social History Narrative   Not on file   Social Drivers of Health   Financial Resource Strain: Not on file  Food Insecurity: Not on file  Transportation Needs: Not on file  Physical Activity: Not on file  Stress: Not on file  Social Connections: Unknown (03/03/2022)   Received from [facility]   Social Network    Social Network: Not on file     Vital Signs: There were no vitals taken for this visit.  Physical Exam Deferred secondary to virtual visit.  Imaging:  PAE, 10/26/23 Bilateral prostatic artery embolization     CT Renal Stone Study Result Date: 12/05/2023 CLINICAL DATA:  Left flank pain and left groin pain EXAM: CT ABDOMEN AND PELVIS WITHOUT CONTRAST TECHNIQUE: Multidetector CT imaging of the abdomen and pelvis was performed following the standard protocol without IV contrast. RADIATION DOSE REDUCTION: This exam was performed according to the departmental dose-optimization program which includes automated exposure control, adjustment of the mA and/or kV according to patient size and/or use of iterative reconstruction technique. COMPARISON:  04/25/2018 FINDINGS: Lower chest: No acute abnormality.  Coronary artery atherosclerosis. Hepatobiliary: No focal liver abnormality is seen. Status post cholecystectomy. No biliary dilatation. Pancreas: Unremarkable. No pancreatic ductal dilatation or surrounding inflammatory changes. Spleen: Scattered granulomas.  No splenomegaly. Adrenals/Urinary Tract: Unremarkable adrenal glands. No solid renal mass, stone, or hydronephrosis. There is a 4 mm stone in the distal left ureter at the level of the pelvic sidewall. Urinary bladder within normal limits for the degree of distension. Stomach/Bowel: Stomach is within normal limits. Appendix appears normal. No  evidence of bowel wall thickening, distention, or inflammatory changes. Scattered colonic diverticulosis. Vascular/Lymphatic: Aortic atherosclerosis. No enlarged abdominal or pelvic lymph nodes. Reproductive: Prostatomegaly. Other: Fat containing left inguinal hernia. The adjacent sigmoid colon partially protrudes into the hernia sac at the hernia neck. No free air or free fluid. Musculoskeletal: No acute or significant osseous findings. IMPRESSION: 1. There is a 4 mm stone in the distal left ureter at the level of the pelvic sidewall. No hydronephrosis. 2. Fat containing left inguinal hernia. The adjacent sigmoid colon partially protrudes into the hernia sac without obstruction. 3. Colonic diverticulosis without evidence of acute diverticulitis. 4. Prostatomegaly. 5. Aortic atherosclerosis (ICD10-I70.0). Electronically Signed   By: Duanne Guess D.O.   On: 12/05/2023 13:53    Labs:  CBC: Recent Labs    10/26/23 1000 12/05/23 1134  WBC 6.2 9.0  HGB 14.7 14.6  HCT 44.0 44.3  PLT 168 166    COAGS: Recent Labs    10/26/23 1000  INR 1.0    BMP: Recent Labs    10/26/23 1000 12/05/23 1134  NA 138 140  K 4.0 4.3  CL 107 105  CO2 24 26  GLUCOSE 133* 202*  BUN 19 18  CALCIUM 9.9 10.5*  CREATININE 0.72 0.82  GFRNONAA >60 >60     Assessment and Plan:  78 y/o M w/ PMHx of Peyronie dx s/p phalloplasty in 2008, prostate CA on surveillance (Gleason 3+3=6 w PNI, 01/30/21), and long standing BPH. No reported prior catheterization or hematuria. Pt is s/p PAE on 10/26/23.   Prostatomegaly (~130 mL). PSA, last 7.1 (09/02/23) IPSS Score Total, 3 pts. Pre PAE 12 pts. QoL, improved. "pleased". Remains on on tamsulosin 0.4 mg qHS  *Pt is very happy with the prostate embolization procedural result and his current urinary state.  *No concern at this time. Follow up with VIR PRN. *Continue routine Urological follow up.   Electronically Signed:  Roanna Banning, MD [facility]   Pager. [phone] Clinic. [phone]  I spent a total of 25 Minutes of non-face-to-face time in clinical consultation, greater than 50% of which was counseling/coordinating care for Mr Matthew Payne's evaluation for BPH with LUTS.

## 2023-12-09 ENCOUNTER — Other Ambulatory Visit: Payer: Self-pay

## 2023-12-09 ENCOUNTER — Telehealth: Payer: Self-pay

## 2023-12-09 ENCOUNTER — Telehealth: Payer: Self-pay | Admitting: Urology

## 2023-12-09 DIAGNOSIS — K4090 Unilateral inguinal hernia, without obstruction or gangrene, not specified as recurrent: Secondary | ICD-10-CM

## 2023-12-09 NOTE — Telephone Encounter (Signed)
Patient called he would like you to put a referral for him to have hernia surgery with Dr Lovell Sheehan group.

## 2023-12-09 NOTE — Telephone Encounter (Signed)
Called Pt to let him know the referral for his hernia was put in and someone from [facility] will reach out to him

## 2023-12-09 NOTE — Telephone Encounter (Signed)
 FYI and advise

## 2023-12-10 ENCOUNTER — Telehealth: Payer: Self-pay | Admitting: *Deleted

## 2023-12-10 NOTE — Telephone Encounter (Signed)
Received call from patient [phone]~ telephone.   Patient reports that he requires urgent appointment with Dr. Lovell Sheehan for hernia repair.   States that he was seen in ER on 12/05/2023 with flank pain and kidney stone. On CT, Left inguinal hernia was noted containing fat and adjacent sigmoid colon with no obstruction. Patient reports increased pain and requested consult. Referral orders were placed by urology per patient request.   Advised that Dr. Lovell Sheehan is out of the office until mid March, and first available consult appointment would be 12/21/2023. Patient states, "I'll be dead by then" and did not want to schedule consult.  Advised that if patient feels he is unable to wait until available consult appointment due to pain he may return to ER for evaluation.

## 2023-12-14 DIAGNOSIS — Z6825 Body mass index (BMI) 25.0-25.9, adult: Secondary | ICD-10-CM | POA: Diagnosis not present

## 2023-12-14 DIAGNOSIS — N200 Calculus of kidney: Secondary | ICD-10-CM | POA: Diagnosis not present

## 2023-12-14 DIAGNOSIS — K4090 Unilateral inguinal hernia, without obstruction or gangrene, not specified as recurrent: Secondary | ICD-10-CM | POA: Diagnosis not present

## 2023-12-14 DIAGNOSIS — E663 Overweight: Secondary | ICD-10-CM | POA: Diagnosis not present

## 2023-12-21 ENCOUNTER — Ambulatory Visit (INDEPENDENT_AMBULATORY_CARE_PROVIDER_SITE_OTHER): Payer: Medicare Other | Admitting: Surgery

## 2023-12-21 ENCOUNTER — Encounter: Payer: Self-pay | Admitting: Surgery

## 2023-12-21 VITALS — BP 143/82 | HR 56 | Temp 97.9°F | Resp 12 | Ht 72.0 in | Wt 187.0 lb

## 2023-12-21 DIAGNOSIS — K4090 Unilateral inguinal hernia, without obstruction or gangrene, not specified as recurrent: Secondary | ICD-10-CM

## 2023-12-21 NOTE — Progress Notes (Signed)
 [facility] History and Physical  Reason for Referral: Inguinal hernia Referring Physician: Dr. Annabell Howells  Chief Complaint   New Patient (Initial Visit)     Matthew Payne is a 78 y.o. male.  HPI: Patient presents for evaluation of a left inguinal hernia.  He states that he thought he had one present a few months ago, but his primary care doctor did not feel a hernia on examination.  Last month, he was evaluated in the emergency department for a kidney stone, and on the CT scan he was noted to have a left inguinal hernia containing a small segment of sigmoid colon.  Since being discharged from the hospital, he did have about a week of pain related to the hernia.  He stated that the hernia pain was mostly in his scrotum, so he was not sure if it was fully related to the hernia or his kidney stone.  He has never noticed a bulge in his groin.  He denies nausea and vomiting, and is having regular bowel movements.  His past medical history significant for hypertension and hyperlipidemia.  He denies use of blood thinning medications.  His surgical history is significant for an open right inguinal hernia performed by Dr. Lovell Sheehan.  He denies use of tobacco products, alcohol, and illicit drugs.  Past Medical History:  Diagnosis Date   Hypercholesteremia    Hypertension     Past Surgical History:  Procedure Laterality Date   CATARACT EXTRACTION, BILATERAL  5 yrs ago   COLONOSCOPY  05/28/2011   Procedure: COLONOSCOPY;  Surgeon: Malissa Hippo, MD;  Location: [facility];  Service: Endoscopy;  Laterality: N/A;   COLONOSCOPY N/A 07/31/2021   Procedure: COLONOSCOPY;  Surgeon: Malissa Hippo, MD;  Location: [facility];  Service: Endoscopy;  Laterality: N/A;  13:00   CYSTECTOMY     back & leg   INGUINAL HERNIA REPAIR  07/01/2012   Procedure: HERNIA REPAIR INGUINAL ADULT;  Surgeon: Dalia Heading, MD;  Location: [facility];  Service: General;  Laterality: Right;  Right Inguinal  Herniorraphy with Mesh   IR 3D INDEPENDENT WKST  10/26/2023   IR ANGIOGRAM PELVIS SELECTIVE OR SUPRASELECTIVE  10/26/2023   IR ANGIOGRAM SELECTIVE EACH ADDITIONAL VESSEL  10/26/2023   IR ANGIOGRAM SELECTIVE EACH ADDITIONAL VESSEL  10/26/2023   IR EMBO TUMOR ORGAN ISCHEMIA INFARCT INC GUIDE ROADMAPPING  10/26/2023   IR RADIOLOGIST EVAL & MGMT  09/08/2023   IR RADIOLOGIST EVAL & MGMT  12/08/2023   IR US GUIDE VASC ACCESS LEFT  10/26/2023   IR US GUIDE VASC ACCESS LEFT  10/26/2023   IR US GUIDE VASC ACCESS LEFT  10/26/2023   Nesbit  Plication     POLYPECTOMY  07/31/2021   Procedure: POLYPECTOMY;  Surgeon: Malissa Hippo, MD;  Location: [facility];  Service: Endoscopy;;   TONSILLECTOMY      No family history on file.  Social History   Tobacco Use   Smoking status: Former    Current packs/day: 0.00    Types: Cigarettes    Quit date: 02/24/1978    Years since quitting: 45.8   Smokeless tobacco: Never  Vaping Use   Vaping status: Never Used  Substance Use Topics   Alcohol use: Yes   Drug use: No    Medications: I have reviewed the patient's current medications. Allergies as of 12/21/2023   No Known Allergies      Medication List        Accurate  as of December 21, 2023  9:09 AM. If you have any questions, ask your nurse or doctor.          STOP taking these medications    ciprofloxacin 500 MG tablet Commonly known as: Cipro Stopped by: Alba Kriesel A Armonie Staten   methylPREDNISolone 4 MG Tbpk tablet Commonly known as: MEDROL DOSEPAK Stopped by: Myriah Boggus A Sinaya Minogue   phenazopyridine 100 MG tablet Commonly known as: Pyridium Stopped by: Lillianna Sabel A Analis Distler   solifenacin 5 MG tablet Commonly known as: VESICARE Stopped by: Justis Closser A Becky Colan       TAKE these medications    diazepam 5 MG tablet Commonly known as: Valium Take 1-2 po one hour prior to MRI   enalapril 10 MG tablet Commonly known as: VASOTEC Take 10 mg by mouth daily.   ESTER C PO Take 1,000 mg by  mouth daily.   ibuprofen 600 MG tablet Commonly known as: ADVIL Take 1 tablet (600 mg total) by mouth every 6 (six) hours as needed.   ondansetron 4 MG disintegrating tablet Commonly known as: ZOFRAN-ODT Take 1 tablet (4 mg total) by mouth every 8 (eight) hours as needed for nausea or vomiting.   oxyCODONE 5 MG immediate release tablet Commonly known as: Oxy IR/ROXICODONE Take 1 tablet (5 mg total) by mouth every 6 (six) hours as needed for severe pain (pain score 7-10).   pravastatin 40 MG tablet Commonly known as: PRAVACHOL Take 40 mg by mouth daily.   sildenafil 20 MG tablet Commonly known as: REVATIO Take 2-3 tabs po prn in combination with 10mg  tadalafil   tadalafil 5 MG tablet Commonly known as: CIALIS Take up to 4 tablets po as needed daily or up to 2 tablets daily po with 2-3 20mg  sildenafil as needed for sexual activity.  Do not take within 4 hours of tamsulosin/flomax.   tamsulosin 0.4 MG Caps capsule Commonly known as: FLOMAX Take 2 capsules (0.8 mg total) by mouth daily.   zolpidem 10 MG tablet Commonly known as: AMBIEN Take by mouth.         ROS:  Constitutional: negative for chills, fatigue, and fevers Eyes: negative for visual disturbance and pain Ears, nose, mouth, throat, and face: negative for ear drainage, sore throat, and sinus problems Respiratory: negative for cough, wheezing, and shortness of breath Cardiovascular: negative for chest pain and palpitations Gastrointestinal: negative for abdominal pain, nausea, reflux symptoms, and vomiting Genitourinary:positive for frequency, negative for dysuria Integument/breast: negative for dryness and rash Hematologic/lymphatic: negative for bleeding and lymphadenopathy Musculoskeletal:positive for back pain and neck pain Neurological: negative for dizziness and tremors Endocrine: negative for temperature intolerance  Blood pressure (!) 143/82, pulse (!) 56, temperature 97.9 F (36.6 C), temperature  source Oral, resp. rate 12, height 6' (1.829 m), weight 187 lb (84.8 kg), SpO2 96%. Physical Exam Vitals reviewed.  Constitutional:      Appearance: Normal appearance.  HENT:     Head: Normocephalic and atraumatic.  Eyes:     Extraocular Movements: Extraocular movements intact.     Pupils: Pupils are equal, round, and reactive to light.  Cardiovascular:     Rate and Rhythm: Normal rate and regular rhythm.  Pulmonary:     Effort: Pulmonary effort is normal.     Breath sounds: Normal breath sounds.  Abdominal:     Comments: Abdomen soft, nondistended, no percussion tenderness, nontender to palpation; no rigidity, guarding, rebound tenderness; soft and reducible left inguinal hernia, nontender to palpation  Musculoskeletal:  General: Normal range of motion.     Cervical back: Normal range of motion.  Skin:    General: Skin is warm and dry.  Neurological:     General: No focal deficit present.     Mental Status: He is alert and oriented to person, place, and time.  Psychiatric:        Mood and Affect: Mood normal.        Behavior: Behavior normal.     Results: CT abdomen and pelvis (12/05/2023): IMPRESSION: 1. There is a 4 mm stone in the distal left ureter at the level of the pelvic sidewall. No hydronephrosis. 2. Fat containing left inguinal hernia. The adjacent sigmoid colon partially protrudes into the hernia sac without obstruction. 3. Colonic diverticulosis without evidence of acute diverticulitis. 4. Prostatomegaly. 5. Aortic atherosclerosis (ICD10-I70.0).   Assessment & Plan:  Matthew Payne is a 78 y.o. male who presents for evaluation of a left inguinal hernia.  -I discussed the pathophysiology of inguinal hernias and we discussed the need for surgical repair versus clinical monitoring -The risk and benefits of robotic assisted laparoscopic left inguinal hernia repair with mesh were discussed including but not limited to bleeding, infection, injury to  surrounding structures, need for additional procedures, and hernia recurrence.  After careful consideration, GRACIN MCPARTLAND has decided to proceed with surgery.  -Patient tentatively scheduled for surgery on 3/20 -Information provided to the patient regarding inguinal hernias and laparoscopic repair of inguinal hernias -Advised that the patient should present to the ED if they begin to have a painful nonreducible bulge, nausea, vomiting, and obstipation  All questions were answered to the satisfaction of the patient.  Note: Portions of this report may have been transcribed using voice recognition software. Every effort has been made to ensure accuracy; however, inadvertent computerized transcription errors may still be present.   Theophilus Kinds, DO [facility] [address] [place], [place] [address] [phone] (office)

## 2023-12-21 NOTE — H&P (Signed)
 [facility] History and Physical  Reason for Referral: Inguinal hernia Referring Physician: Dr. Annabell Howells  Chief Complaint   New Patient (Initial Visit)     Matthew Payne is a 78 y.o. male.  HPI: Patient presents for evaluation of a left inguinal hernia.  He states that he thought he had one present a few months ago, but his primary care doctor did not feel a hernia on examination.  Last month, he was evaluated in the emergency department for a kidney stone, and on the CT scan he was noted to have a left inguinal hernia containing a small segment of sigmoid colon.  Since being discharged from the hospital, he did have about a week of pain related to the hernia.  He stated that the hernia pain was mostly in his scrotum, so he was not sure if it was fully related to the hernia or his kidney stone.  He has never noticed a bulge in his groin.  He denies nausea and vomiting, and is having regular bowel movements.  His past medical history significant for hypertension and hyperlipidemia.  He denies use of blood thinning medications.  His surgical history is significant for an open right inguinal hernia performed by Dr. Lovell Sheehan.  He denies use of tobacco products, alcohol, and illicit drugs.  Past Medical History:  Diagnosis Date   Hypercholesteremia    Hypertension     Past Surgical History:  Procedure Laterality Date   CATARACT EXTRACTION, BILATERAL  5 yrs ago   COLONOSCOPY  05/28/2011   Procedure: COLONOSCOPY;  Surgeon: Malissa Hippo, MD;  [facility];  Service: Endoscopy;  Laterality: N/A;   COLONOSCOPY N/A 07/31/2021   Procedure: COLONOSCOPY;  Surgeon: Malissa Hippo, MD;  [facility]: [facility];  Service: Endoscopy;  Laterality: N/A;  13:00   CYSTECTOMY     back & leg   INGUINAL HERNIA REPAIR  07/01/2012   Procedure: HERNIA REPAIR INGUINAL ADULT;  Surgeon: Dalia Heading, MD;  Location: [facility];  Service: General;  Laterality: Right;  Right Inguinal  Herniorraphy with Mesh   IR 3D INDEPENDENT WKST  10/26/2023   IR ANGIOGRAM PELVIS SELECTIVE OR SUPRASELECTIVE  10/26/2023   IR ANGIOGRAM SELECTIVE EACH ADDITIONAL VESSEL  10/26/2023   IR ANGIOGRAM SELECTIVE EACH ADDITIONAL VESSEL  10/26/2023   IR EMBO TUMOR ORGAN ISCHEMIA INFARCT INC GUIDE ROADMAPPING  10/26/2023   IR RADIOLOGIST EVAL & MGMT  09/08/2023   IR RADIOLOGIST EVAL & MGMT  12/08/2023   IR US GUIDE VASC ACCESS LEFT  10/26/2023   IR US GUIDE VASC ACCESS LEFT  10/26/2023   IR US GUIDE VASC ACCESS LEFT  10/26/2023   Nesbit  Plication     POLYPECTOMY  07/31/2021   Procedure: POLYPECTOMY;  Surgeon: Malissa Hippo, MD;  [facility]: [facility];  Service: Endoscopy;;   TONSILLECTOMY      No family history on file.  Social History   Tobacco Use   Smoking status: Former    Current packs/day: 0.00    Types: Cigarettes    Quit date: 02/24/1978    Years since quitting: 45.8   Smokeless tobacco: Never  Vaping Use   Vaping status: Never Used  Substance Use Topics   Alcohol use: Yes   Drug use: No    Medications: I have reviewed the patient's current medications. Allergies as of 12/21/2023   No Known Allergies      Medication List        Accurate  as of December 21, 2023  9:09 AM. If you have any questions, ask your nurse or doctor.          STOP taking these medications    ciprofloxacin 500 MG tablet Commonly known as: Cipro Stopped by: Alba Kriesel A Armonie Staten   methylPREDNISolone 4 MG Tbpk tablet Commonly known as: MEDROL DOSEPAK Stopped by: Myriah Boggus A Sinaya Minogue   phenazopyridine 100 MG tablet Commonly known as: Pyridium Stopped by: Lillianna Sabel A Analis Distler   solifenacin 5 MG tablet Commonly known as: VESICARE Stopped by: Justis Closser A Becky Colan       TAKE these medications    diazepam 5 MG tablet Commonly known as: Valium Take 1-2 po one hour prior to MRI   enalapril 10 MG tablet Commonly known as: VASOTEC Take 10 mg by mouth daily.   ESTER C PO Take 1,000 mg by  mouth daily.   ibuprofen 600 MG tablet Commonly known as: ADVIL Take 1 tablet (600 mg total) by mouth every 6 (six) hours as needed.   ondansetron 4 MG disintegrating tablet Commonly known as: ZOFRAN-ODT Take 1 tablet (4 mg total) by mouth every 8 (eight) hours as needed for nausea or vomiting.   oxyCODONE 5 MG immediate release tablet Commonly known as: Oxy IR/ROXICODONE Take 1 tablet (5 mg total) by mouth every 6 (six) hours as needed for severe pain (pain score 7-10).   pravastatin 40 MG tablet Commonly known as: PRAVACHOL Take 40 mg by mouth daily.   sildenafil 20 MG tablet Commonly known as: REVATIO Take 2-3 tabs po prn in combination with 10mg  tadalafil   tadalafil 5 MG tablet Commonly known as: CIALIS Take up to 4 tablets po as needed daily or up to 2 tablets daily po with 2-3 20mg  sildenafil as needed for sexual activity.  Do not take within 4 hours of tamsulosin/flomax.   tamsulosin 0.4 MG Caps capsule Commonly known as: FLOMAX Take 2 capsules (0.8 mg total) by mouth daily.   zolpidem 10 MG tablet Commonly known as: AMBIEN Take by mouth.         ROS:  Constitutional: negative for chills, fatigue, and fevers Eyes: negative for visual disturbance and pain Ears, nose, mouth, throat, and face: negative for ear drainage, sore throat, and sinus problems Respiratory: negative for cough, wheezing, and shortness of breath Cardiovascular: negative for chest pain and palpitations Gastrointestinal: negative for abdominal pain, nausea, reflux symptoms, and vomiting Genitourinary:positive for frequency, negative for dysuria Integument/breast: negative for dryness and rash Hematologic/lymphatic: negative for bleeding and lymphadenopathy Musculoskeletal:positive for back pain and neck pain Neurological: negative for dizziness and tremors Endocrine: negative for temperature intolerance  Blood pressure (!) 143/82, pulse (!) 56, temperature 97.9 F (36.6 C), temperature  source Oral, resp. rate 12, height 6' (1.829 m), weight 187 lb (84.8 kg), SpO2 96%. Physical Exam Vitals reviewed.  Constitutional:      Appearance: Normal appearance.  HENT:     Head: Normocephalic and atraumatic.  Eyes:     Extraocular Movements: Extraocular movements intact.     Pupils: Pupils are equal, round, and reactive to light.  Cardiovascular:     Rate and Rhythm: Normal rate and regular rhythm.  Pulmonary:     Effort: Pulmonary effort is normal.     Breath sounds: Normal breath sounds.  Abdominal:     Comments: Abdomen soft, nondistended, no percussion tenderness, nontender to palpation; no rigidity, guarding, rebound tenderness; soft and reducible left inguinal hernia, nontender to palpation  Musculoskeletal:  General: Normal range of motion.     Cervical back: Normal range of motion.  Skin:    General: Skin is warm and dry.  Neurological:     General: No focal deficit present.     Mental Status: He is alert and oriented to person, place, and time.  Psychiatric:        Mood and Affect: Mood normal.        Behavior: Behavior normal.     Results: CT abdomen and pelvis (12/05/2023): IMPRESSION: 1. There is a 4 mm stone in the distal left ureter at the level of the pelvic sidewall. No hydronephrosis. 2. Fat containing left inguinal hernia. The adjacent sigmoid colon partially protrudes into the hernia sac without obstruction. 3. Colonic diverticulosis without evidence of acute diverticulitis. 4. Prostatomegaly. 5. Aortic atherosclerosis (ICD10-I70.0).   Assessment & Plan:  Matthew Payne is a 78 y.o. male who presents for evaluation of a left inguinal hernia.  -I discussed the pathophysiology of inguinal hernias and we discussed the need for surgical repair versus clinical monitoring -The risk and benefits of robotic assisted laparoscopic left inguinal hernia repair with mesh were discussed including but not limited to bleeding, infection, injury to  surrounding structures, need for additional procedures, and hernia recurrence.  After careful consideration, Matthew Payne has decided to proceed with surgery.  -Patient tentatively scheduled for surgery on 3/20 -Information provided to the patient regarding inguinal hernias and laparoscopic repair of inguinal hernias -Advised that the patient should present to the ED if they begin to have a painful nonreducible bulge, nausea, vomiting, and obstipation  All questions were answered to the satisfaction of the patient.  Note: Portions of this report may have been transcribed using voice recognition software. Every effort has been made to ensure accuracy; however, inadvertent computerized transcription errors may still be present.   Theophilus Kinds, DO [facility] [address] [place], [place] [address] [phone] (office)

## 2024-01-03 NOTE — Patient Instructions (Signed)
 Matthew Payne  01/03/2024     @PREFPERIOPPHARMACY @   Your procedure is scheduled on 01/06/2024.   Report to [facility] at  0600 A.M.   Call this number if you have problems the morning of surgery:  [phone]  If you experience any cold or flu symptoms such as cough, fever, chills, shortness of breath, etc. between now and your scheduled surgery, please notify us at the above number.   Remember:  Do not eat after midnight.   You may drink clear liquids until  0330 am on 01/06/2024.   Clear liquids allowed are:                    Water, Carbonated beverages (diabetics please choose diet or no sugar options), Clear Tea (No creamer, milk, or cream, including half & half and powdered creamer), Black Coffee Only (No creamer, milk or cream, including half & half and powdered creamer), and Clear Sports drink (No red color; diabetics please choose diet or no sugar options)    Take these medicines the morning of surgery with A SIP OF WATER                                                     tamsulosin.     Do not wear jewelry, make-up or nail polish, including gel polish,  artificial nails, or any other type of covering on natural nails (fingers and  toes).  Do not wear lotions, powders, or perfumes, or deodorant.  Do not shave 48 hours prior to surgery.  Men may shave face and neck.  Do not bring valuables to the hospital.  [facility] is not responsible for any belongings or valuables.  Contacts, dentures or bridgework may not be worn into surgery.  Leave your suitcase in the car.  After surgery it may be brought to your room.  For patients admitted to the hospital, discharge time will be determined by your treatment team.  Patients discharged the day of surgery will not be allowed to drive home and must have someone with them for 24 hours.    Special instructions:   DO NOT smoke tobacco or vape for 24 hours before your procedure.  Please read over the  following fact sheets that you were given. Coughing and Deep Breathing, Surgical Site Infection Prevention, Anesthesia Post-op Instructions, and Care and Recovery After Surgery      Laparoscopic Inguinal Hernia Repair, Adult, Care After The following information offers guidance on how to care for yourself after your procedure. Your health care provider may also give you more specific instructions. If you have problems or questions, contact your health care provider. What can I expect after the procedure? After the procedure, it is common to have: Pain. Swelling and bruising around the incision area. Scrotal swelling, in males. Some fluid or blood draining from your incisions. Follow these instructions at home: Medicines Take over-the-counter and prescription medicines only as told by your health care provider. Ask your health care provider if the medicine prescribed to you: Requires you to avoid driving or using machinery. Can cause constipation. You may need to take these actions to prevent or treat constipation: Drink enough fluid to keep your urine pale yellow. Take over-the-counter or prescription medicines. Eat foods that are high  in fiber, such as beans, whole grains, and fresh fruits and vegetables. Limit foods that are high in fat and processed sugars, such as fried or sweet foods. Incision care  Follow instructions from your health care provider about how to take care of your incisions. Make sure you: Wash your hands with soap and water for at least 20 seconds before and after you change your bandage (dressing). If soap and water are not available, use hand sanitizer. Change your dressing as told by your health care provider. Leave stitches (sutures), skin glue, or adhesive strips in place. These skin closures may need to stay in place for 2 weeks or longer. If adhesive strip edges start to loosen and curl up, you may trim the loose edges. Do not remove adhesive strips completely  unless your health care provider tells you to do that. Check your incision area every day for signs of infection. Check for: More redness, swelling, or pain. More fluid or blood. Warmth. Pus or a bad smell. Wear loose, soft clothing while your incisions heal. Managing pain and swelling If directed, put ice on the painful or swollen areas. To do this: Put ice in a plastic bag. Place a towel between your skin and the bag. Leave the ice on for 20 minutes, 2-3 times a day. Remove the ice if your skin turns bright red. This is very important. If you cannot feel pain, heat, or cold, you have a greater risk of damage to the area.  Activity Do not lift anything that is heavier than 10 lb (4.5 kg), or the limit that you are told, until your health care provider says that it is safe. Ask your health care provider what activities are safe for you. A lot of activity during the first week after surgery can increase pain and swelling. For 1 week after your procedure: Avoid activities that take a lot of effort, such as exercise or sports. You may walk and climb stairs as needed for daily activity, but avoid long walks or climbing stairs for exercise. General instructions If you were given a sedative during the procedure, it can affect you for several hours. Do not drive or operate machinery until your health care provider says that it is safe. Do not take baths, swim, or use a hot tub until your health care provider approves. Ask your health care provider if you may take showers. You may only be allowed to take sponge baths. Do not use any products that contain nicotine or tobacco. These products include cigarettes, chewing tobacco, and vaping devices, such as e-cigarettes. If you need help quitting, ask your health care provider. Keep all follow-up visits. This is important. Contact a health care provider if: You have any of these signs of infection: More redness, swelling, or pain around your incisions  or your groin area. More fluid or blood coming from an incision. Warmth coming from an incision. Pus or a bad smell coming from an incision. A fever or chills. You have more swelling in your scrotum, if you are male. You have severe pain and medicines do not help. You have abdominal pain or swelling. You cannot urinate or have a bowel movement. You faint or feel dizzy. You have nausea and vomiting. Get help right away if: You have redness, warmth, or pain in your leg. You have chest pain. You have problems breathing. These symptoms may represent a serious problem that is an emergency. Do not wait to see if the symptoms will go away.  Get medical help right away. Call your local emergency services (911 in the [place] Do not drive yourself to the hospital. Summary Pain, swelling, and bruising are common after the procedure. Check your incision area every day for signs of infection, such as more redness, swelling, or pain. Put ice on painful or swollen areas for 20 minutes, 2-3 times a day. This information is not intended to replace advice given to you by your health care provider. Make sure you discuss any questions you have with your health care provider. Document Revised: 06/04/2020 Document Reviewed: 06/04/2020 Elsevier Patient Education  2024 Elsevier Inc.General Anesthesia, Adult, Care After The following information offers guidance on how to care for yourself after your procedure. Your health care provider may also give you more specific instructions. If you have problems or questions, contact your health care provider. What can I expect after the procedure? After the procedure, it is common for people to: Have pain or discomfort at the IV site. Have nausea or vomiting. Have a sore throat or hoarseness. Have trouble concentrating. Feel cold or chills. Feel weak, sleepy, or tired (fatigue). Have soreness and body aches. These can affect parts of the body that were not involved in  surgery. Follow these instructions at home: For the time period you were told by your health care provider:  Rest. Do not participate in activities where you could fall or become injured. Do not drive or use machinery. Do not drink alcohol. Do not take sleeping pills or medicines that cause drowsiness. Do not make important decisions or sign legal documents. Do not take care of children on your own. General instructions Drink enough fluid to keep your urine pale yellow. If you have sleep apnea, surgery and certain medicines can increase your risk for breathing problems. Follow instructions from your health care provider about wearing your sleep device: Anytime you are sleeping, including during daytime naps. While taking prescription pain medicines, sleeping medicines, or medicines that make you drowsy. Return to your normal activities as told by your health care provider. Ask your health care provider what activities are safe for you. Take over-the-counter and prescription medicines only as told by your health care provider. Do not use any products that contain nicotine or tobacco. These products include cigarettes, chewing tobacco, and vaping devices, such as e-cigarettes. These can delay incision healing after surgery. If you need help quitting, ask your health care provider. Contact a health care provider if: You have nausea or vomiting that does not get better with medicine. You vomit every time you eat or drink. You have pain that does not get better with medicine. You cannot urinate or have bloody urine. You develop a skin rash. You have a fever. Get help right away if: You have trouble breathing. You have chest pain. You vomit blood. These symptoms may be an emergency. Get help right away. Call 911. Do not wait to see if the symptoms will go away. Do not drive yourself to the hospital. Summary After the procedure, it is common to have a sore throat, hoarseness, nausea,  vomiting, or to feel weak, sleepy, or fatigue. For the time period you were told by your health care provider, do not drive or use machinery. Get help right away if you have difficulty breathing, have chest pain, or vomit blood. These symptoms may be an emergency. This information is not intended to replace advice given to you by your health care provider. Make sure you discuss any questions you have with your  health care provider. Document Revised: 01/02/2022 Document Reviewed: 01/02/2022 Elsevier Patient Education  2024 Elsevier Inc.How to Use Chlorhexidine at Home in the Shower Chlorhexidine gluconate (CHG) is a germ-killing (antiseptic) wash that's used to clean the skin. It can get rid of the germs that normally live on the skin and can keep them away for about 24 hours. If you're having surgery, you may be told to shower with CHG at home the night before surgery. This can help lower your risk for infection. To use CHG wash in the shower, follow the steps below. Supplies needed: CHG body wash. Clean washcloth. Clean towel. How to use CHG in the shower Follow these steps unless you're told to use CHG in a different way: Start the shower. Use your normal soap and shampoo to wash your face and hair. Turn off the shower or move out of the shower stream. Pour CHG onto a clean washcloth. Do not use any type of brush or rough sponge. Start at your neck, washing your body down to your toes. Make sure you: Wash the part of your body where the surgery will be done for at least 1 minute. Do not scrub. Do not use CHG on your head or face unless your health care provider tells you to. If it gets into your ears or eyes, rinse them well with water. Do not wash your genitals with CHG. Wash your back and under your arms. Make sure to wash skin folds. Let the CHG sit on your skin for 1-2 minutes or as long as told. Rinse your entire body in the shower, including all body creases and folds. Turn off the  shower. Dry off with a clean towel. Do not put anything on your skin afterward, such as powder, lotion, or perfume. Put on clean clothes or pajamas. If it's the night before surgery, sleep in clean sheets. General tips Use CHG only as told, and follow the instructions on the label. Use the full amount of CHG as told. This is often one bottle. Do not smoke and stay away from flames after using CHG. Your skin may feel sticky after using CHG. This is normal. The sticky feeling will go away as the CHG dries. Do not use CHG: If you have a chlorhexidine allergy or have reacted to chlorhexidine in the past. On open wounds or areas of skin that have broken skin, cuts, or scrapes. On babies younger than 26 months of age. Contact a health care provider if: You have questions about using CHG. Your skin gets irritated or itchy. You have a rash after using CHG. You swallow any CHG. Call your local poison control center [phone] in the [place] Your eyes itch badly, or they become very red or swollen. Your hearing changes. You have trouble seeing. If you can't reach your provider, go to an urgent care or emergency room. Do not drive yourself. Get help right away if: You have swelling or tingling in your mouth or throat. You make high-pitched whistling sounds when you breathe, most often when you breathe out (wheeze). You have trouble breathing. These symptoms may be an emergency. Call 911 right away. Do not wait to see if the symptoms will go away. Do not drive yourself to the hospital. This information is not intended to replace advice given to you by your health care provider. Make sure you discuss any questions you have with your health care provider. Document Revised: 04/20/2023 Document Reviewed: 04/16/2022 Elsevier Patient Education  2024 [employer].

## 2024-01-04 ENCOUNTER — Encounter (HOSPITAL_COMMUNITY)
Admission: RE | Admit: 2024-01-04 | Discharge: 2024-01-04 | Disposition: A | Discharge status: Home / Self Care | Source: Ambulatory Visit | Attending: Surgery | Admitting: Surgery

## 2024-01-04 ENCOUNTER — Other Ambulatory Visit: Payer: Self-pay

## 2024-01-04 ENCOUNTER — Encounter (HOSPITAL_COMMUNITY): Payer: Self-pay

## 2024-01-04 VITALS — BP 143/82 | HR 56 | Temp 97.9°F | Resp 18 | Ht 72.0 in | Wt 187.0 lb

## 2024-01-04 DIAGNOSIS — Z01810 Encounter for preprocedural cardiovascular examination: Secondary | ICD-10-CM | POA: Insufficient documentation

## 2024-01-04 DIAGNOSIS — I10 Essential (primary) hypertension: Secondary | ICD-10-CM | POA: Insufficient documentation

## 2024-01-05 NOTE — Anesthesia Preprocedure Evaluation (Signed)
 Anesthesia Evaluation  Patient identified by MRN, date of birth, ID band Patient awake    Reviewed: Allergy & Precautions, H&P , NPO status , Patient's Chart, lab work & pertinent test results, reviewed documented beta blocker date and time   History of Anesthesia Complications Negative for: history of anesthetic complications  Airway Mallampati: I  TM Distance: >3 FB Neck ROM: full    Dental  (+) Edentulous Upper, Edentulous Lower   Pulmonary neg pulmonary ROS, former smoker   Pulmonary exam normal breath sounds clear to auscultation       Cardiovascular Exercise Tolerance: Good hypertension, Pt. on medications Normal cardiovascular exam Rhythm:Regular Rate:Normal     Neuro/Psych negative neurological ROS  negative psych ROS   GI/Hepatic negative GI ROS, Neg liver ROS,neg GERD  ,,  Endo/Other  negative endocrine ROS    Renal/GU negative Renal ROS  negative genitourinary   Musculoskeletal   Abdominal   Peds  Hematology negative hematology ROS (+)   Anesthesia Other Findings   Reproductive/Obstetrics negative OB ROS                             Anesthesia Physical Anesthesia Plan  ASA: 2  Anesthesia Plan: General   Post-op Pain Management: Dilaudid IV   Induction: Intravenous  PONV Risk Score and Plan: Ondansetron, Dexamethasone and Midazolam  Airway Management Planned: Oral ETT  Additional Equipment: None  Intra-op Plan:   Post-operative Plan: Extubation in OR  Informed Consent: I have reviewed the patients History and Physical, chart, labs and discussed the procedure including the risks, benefits and alternatives for the proposed anesthesia with the patient or authorized representative who has indicated his/her understanding and acceptance.     Dental Advisory Given  Plan Discussed with: CRNA  Anesthesia Plan Comments:         Anesthesia Quick Evaluation

## 2024-01-06 ENCOUNTER — Ambulatory Visit (HOSPITAL_COMMUNITY)
Admission: RE | Admit: 2024-01-06 | Discharge: 2024-01-06 | Disposition: A | Discharge status: Home / Self Care | Attending: Surgery | Admitting: Surgery

## 2024-01-06 ENCOUNTER — Encounter (HOSPITAL_COMMUNITY)
Admission: RE | Disposition: A | Discharge status: Home / Self Care | Payer: Self-pay | Source: Home / Self Care | Attending: Surgery

## 2024-01-06 ENCOUNTER — Ambulatory Visit (HOSPITAL_COMMUNITY): Payer: Self-pay

## 2024-01-06 ENCOUNTER — Ambulatory Visit (HOSPITAL_BASED_OUTPATIENT_CLINIC_OR_DEPARTMENT_OTHER): Payer: Self-pay

## 2024-01-06 DIAGNOSIS — I10 Essential (primary) hypertension: Secondary | ICD-10-CM | POA: Insufficient documentation

## 2024-01-06 DIAGNOSIS — K5730 Diverticulosis of large intestine without perforation or abscess without bleeding: Secondary | ICD-10-CM | POA: Diagnosis not present

## 2024-01-06 DIAGNOSIS — K660 Peritoneal adhesions (postprocedural) (postinfection): Secondary | ICD-10-CM | POA: Insufficient documentation

## 2024-01-06 DIAGNOSIS — N400 Enlarged prostate without lower urinary tract symptoms: Secondary | ICD-10-CM | POA: Diagnosis not present

## 2024-01-06 DIAGNOSIS — K4090 Unilateral inguinal hernia, without obstruction or gangrene, not specified as recurrent: Secondary | ICD-10-CM | POA: Insufficient documentation

## 2024-01-06 DIAGNOSIS — N202 Calculus of kidney with calculus of ureter: Secondary | ICD-10-CM | POA: Diagnosis not present

## 2024-01-06 DIAGNOSIS — I700 Atherosclerosis of aorta: Secondary | ICD-10-CM | POA: Insufficient documentation

## 2024-01-06 DIAGNOSIS — Z87891 Personal history of nicotine dependence: Secondary | ICD-10-CM | POA: Diagnosis not present

## 2024-01-06 HISTORY — PX: ROBOTIC ASSISTED LAPAROSCOPIC LYSIS OF ADHESION: SHX6080

## 2024-01-06 HISTORY — PX: XI ROBOTIC ASSISTED INGUINAL HERNIA REPAIR WITH MESH: SHX6706

## 2024-01-06 SURGERY — REPAIR, HERNIA, INGUINAL, ROBOT-ASSISTED, LAPAROSCOPIC, USING MESH
Anesthesia: General | Site: Inguinal

## 2024-01-06 MED ORDER — FENTANYL CITRATE (PF) 100 MCG/2ML IJ SOLN
INTRAMUSCULAR | Status: AC
  Filled 2024-01-06: qty 2

## 2024-01-06 MED ORDER — ORAL CARE MOUTH RINSE: 15.0000 mL | Freq: Once | OROMUCOSAL | Status: AC

## 2024-01-06 MED ORDER — SUGAMMADEX SODIUM 200 MG/2ML IV SOLN
INTRAVENOUS | Status: DC | PRN
  Administered 2024-01-06: 400 mg via INTRAVENOUS

## 2024-01-06 MED ORDER — LIDOCAINE HCL (CARDIAC) PF 100 MG/5ML IV SOSY: PREFILLED_SYRINGE | INTRAVENOUS | Status: DC | PRN

## 2024-01-06 MED ORDER — CHLORHEXIDINE GLUCONATE CLOTH 2 % EX PADS
6.0000 | MEDICATED_PAD | Freq: Once | CUTANEOUS | Status: AC
  Administered 2024-01-06: 6 via TOPICAL

## 2024-01-06 MED ORDER — EPHEDRINE SULFATE-NACL 50-0.9 MG/10ML-% IV SOSY
PREFILLED_SYRINGE | INTRAVENOUS | Status: DC | PRN
  Administered 2024-01-06 (×3): 5 mg via INTRAVENOUS

## 2024-01-06 MED ORDER — ONDANSETRON HCL 4 MG/2ML IJ SOLN
INTRAMUSCULAR | Status: AC
  Filled 2024-01-06: qty 2

## 2024-01-06 MED ORDER — LACTATED RINGERS IV SOLN: INTRAVENOUS | Status: DC

## 2024-01-06 MED ORDER — PROPOFOL 10 MG/ML IV BOLUS
INTRAVENOUS | Status: AC
  Filled 2024-01-06: qty 20

## 2024-01-06 MED ORDER — DEXMEDETOMIDINE HCL IN NACL 80 MCG/20ML IV SOLN
INTRAVENOUS | Status: AC
  Filled 2024-01-06: qty 20

## 2024-01-06 MED ORDER — PROPOFOL 10 MG/ML IV BOLUS
INTRAVENOUS | Status: DC | PRN
  Administered 2024-01-06: 150 mg via INTRAVENOUS

## 2024-01-06 MED ORDER — OXYCODONE HCL 5 MG PO TABS
5.0000 mg | ORAL_TABLET | Freq: Once | ORAL | Status: AC | PRN
  Administered 2024-01-06: 5 mg via ORAL
  Filled 2024-01-06: qty 1

## 2024-01-06 MED ORDER — ROCURONIUM BROMIDE 10 MG/ML (PF) SYRINGE: PREFILLED_SYRINGE | INTRAVENOUS | Status: DC | PRN

## 2024-01-06 MED ORDER — LIDOCAINE HCL (PF) 2 % IJ SOLN
INTRAMUSCULAR | Status: AC
  Filled 2024-01-06: qty 5

## 2024-01-06 MED ORDER — CEFAZOLIN SODIUM-DEXTROSE 2-4 GM/100ML-% IV SOLN
2.0000 g | INTRAVENOUS | Status: AC
  Administered 2024-01-06: 2 g via INTRAVENOUS
  Filled 2024-01-06: qty 100

## 2024-01-06 MED ORDER — HYDROMORPHONE HCL 1 MG/ML IJ SOLN
0.2500 mg | INTRAMUSCULAR | Status: DC | PRN
  Administered 2024-01-06 (×2): 0.25 mg via INTRAVENOUS
  Filled 2024-01-06: qty 0.5

## 2024-01-06 MED ORDER — FENTANYL CITRATE (PF) 100 MCG/2ML IJ SOLN
INTRAMUSCULAR | Status: DC | PRN
Start: 2024-01-06 — End: 2024-01-06
  Administered 2024-01-06 (×2): 50 ug via INTRAVENOUS

## 2024-01-06 MED ORDER — DEXMEDETOMIDINE HCL IN NACL 80 MCG/20ML IV SOLN
INTRAVENOUS | Status: DC | PRN
  Administered 2024-01-06 (×2): 4 ug via INTRAVENOUS

## 2024-01-06 MED ORDER — ACETAMINOPHEN 500 MG PO TABS
1000.0000 mg | ORAL_TABLET | Freq: Once | ORAL | Status: AC
  Administered 2024-01-06: 1000 mg via ORAL
  Filled 2024-01-06: qty 2

## 2024-01-06 MED ORDER — BUPIVACAINE HCL (PF) 0.5 % IJ SOLN
INTRAMUSCULAR | Status: AC
Start: 2024-01-06 — End: ?
  Filled 2024-01-06: qty 30

## 2024-01-06 MED ORDER — OXYCODONE HCL 5 MG PO TABS: 5.0000 mg | ORAL_TABLET | Freq: Four times a day (QID) | ORAL | 0 refills | Status: DC | PRN

## 2024-01-06 MED ORDER — DOCUSATE SODIUM 100 MG PO CAPS: 100.0000 mg | ORAL_CAPSULE | Freq: Two times a day (BID) | ORAL | 2 refills | Status: DC

## 2024-01-06 MED ORDER — LACTATED RINGERS IV SOLN
INTRAVENOUS | Status: DC | PRN
Start: 2024-01-06 — End: 2024-01-06

## 2024-01-06 MED ORDER — BUPIVACAINE HCL (PF) 0.5 % IJ SOLN
INTRAMUSCULAR | Status: DC | PRN
  Administered 2024-01-06: 30 mL

## 2024-01-06 MED ORDER — CHLORHEXIDINE GLUCONATE 0.12 % MT SOLN
15.0000 mL | Freq: Once | OROMUCOSAL | Status: AC
  Administered 2024-01-06: 15 mL via OROMUCOSAL

## 2024-01-06 MED ORDER — OXYCODONE HCL 5 MG/5ML PO SOLN: 5.0000 mg | Freq: Once | ORAL | Status: AC | PRN

## 2024-01-06 MED ORDER — ROCURONIUM BROMIDE 10 MG/ML (PF) SYRINGE
PREFILLED_SYRINGE | INTRAVENOUS | Status: DC | PRN
Start: 2024-01-06 — End: 2024-01-06
  Administered 2024-01-06: 10 mg via INTRAVENOUS
  Administered 2024-01-06: 60 mg via INTRAVENOUS

## 2024-01-06 MED ORDER — ACETAMINOPHEN 500 MG PO TABS: 1000.0000 mg | ORAL_TABLET | Freq: Four times a day (QID) | ORAL | 0 refills | Status: AC

## 2024-01-06 MED ORDER — ACETAMINOPHEN 160 MG/5ML PO SOLN
960.0000 mg | Freq: Once | ORAL | Status: AC
  Filled 2024-01-06: qty 30

## 2024-01-06 MED ORDER — ONDANSETRON HCL 4 MG/2ML IJ SOLN
INTRAMUSCULAR | Status: DC | PRN
  Administered 2024-01-06: 4 mg via INTRAVENOUS

## 2024-01-06 MED ORDER — SUGAMMADEX SODIUM 200 MG/2ML IV SOLN
INTRAVENOUS | Status: AC
  Filled 2024-01-06: qty 6

## 2024-01-06 MED ORDER — ROCURONIUM BROMIDE 10 MG/ML (PF) SYRINGE
PREFILLED_SYRINGE | INTRAVENOUS | Status: AC
  Filled 2024-01-06: qty 10

## 2024-01-06 MED ORDER — LIDOCAINE HCL (CARDIAC) PF 100 MG/5ML IV SOSY
PREFILLED_SYRINGE | INTRAVENOUS | Status: DC | PRN
  Administered 2024-01-06: 100 mg via INTRAVENOUS

## 2024-01-06 MED ORDER — STERILE WATER FOR IRRIGATION IR SOLN
Status: DC | PRN
  Administered 2024-01-06: 500 mL

## 2024-01-06 MED ORDER — ONDANSETRON HCL 4 MG/2ML IJ SOLN
INTRAMUSCULAR | Status: AC
  Filled 2024-01-06: qty 4

## 2024-01-06 MED ORDER — SODIUM CHLORIDE 0.9 % IV SOLN: 12.5000 mg | INTRAVENOUS | Status: DC | PRN

## 2024-01-06 MED ORDER — CHLORHEXIDINE GLUCONATE CLOTH 2 % EX PADS: 6.0000 | MEDICATED_PAD | Freq: Once | CUTANEOUS | Status: DC

## 2024-01-06 MED ORDER — PHENYLEPHRINE 80 MCG/ML (10ML) SYRINGE FOR IV PUSH (FOR BLOOD PRESSURE SUPPORT)
PREFILLED_SYRINGE | INTRAVENOUS | Status: DC | PRN
  Administered 2024-01-06 (×3): 80 ug via INTRAVENOUS
  Administered 2024-01-06: 160 ug via INTRAVENOUS
  Administered 2024-01-06 (×3): 80 ug via INTRAVENOUS

## 2024-01-06 SURGICAL SUPPLY — 44 items
BLADE SURG 15 STRL LF DISP TIS (BLADE) ×3 IMPLANT
CHLORAPREP W/TINT 26 (MISCELLANEOUS) ×3 IMPLANT
COVER LIGHT HANDLE STERIS (MISCELLANEOUS) ×6 IMPLANT
COVER MAYO STAND XLG (MISCELLANEOUS) ×3 IMPLANT
COVER TIP SHEARS 8 DVNC (MISCELLANEOUS) ×3 IMPLANT
DEFOGGER SCOPE WARMER CLEARIFY (MISCELLANEOUS) ×3 IMPLANT
DERMABOND ADVANCED .7 DNX12 (GAUZE/BANDAGES/DRESSINGS) ×3 IMPLANT
DRAPE ARM DVNC X/XI (DISPOSABLE) ×9 IMPLANT
DRAPE COLUMN DVNC XI (DISPOSABLE) ×3 IMPLANT
DRIVER NDL MEGA SUTCUT DVNCXI (INSTRUMENTS) ×3 IMPLANT
DRIVER NDLE MEGA SUTCUT DVNCXI (INSTRUMENTS) ×2 IMPLANT
ELECT REM PT RETURN 9FT ADLT (ELECTROSURGICAL) ×2 IMPLANT
ELECTRODE REM PT RTRN 9FT ADLT (ELECTROSURGICAL) ×3 IMPLANT
FORCEPS BPLR R/ABLATION 8 DVNC (INSTRUMENTS) ×3 IMPLANT
GAUZE SPONGE 4X4 12PLY STRL (GAUZE/BANDAGES/DRESSINGS) ×3 IMPLANT
GLOVE BIOGEL PI IND STRL 6.5 (GLOVE) ×6 IMPLANT
GLOVE BIOGEL PI IND STRL 7.0 (GLOVE) ×6 IMPLANT
GLOVE SURG SS PI 6.5 STRL IVOR (GLOVE) ×6 IMPLANT
GOWN STRL REUS W/TWL LRG LVL3 (GOWN DISPOSABLE) ×9 IMPLANT
KIT PINK PAD W/HEAD ARE REST (MISCELLANEOUS) ×2 IMPLANT
KIT PINK PAD W/HEAD ARM REST (MISCELLANEOUS) ×3 IMPLANT
KIT TURNOVER KIT A (KITS) ×3 IMPLANT
MANIFOLD NEPTUNE II (INSTRUMENTS) ×3 IMPLANT
MESH PROGRIP LAP SLF FIX 16X12 (Mesh General) ×3 IMPLANT
NDL HYPO 21X1 ECLIPSE (NEEDLE) ×3 IMPLANT
NDL INSUFFLATION 14GA 120MM (NEEDLE) ×3 IMPLANT
NEEDLE HYPO 21X1 ECLIPSE (NEEDLE) ×2 IMPLANT
NEEDLE INSUFFLATION 14GA 120MM (NEEDLE) ×2 IMPLANT
OBTURATOR OPTICAL STND 8 DVNC (TROCAR) ×2 IMPLANT
OBTURATOR OPTICALSTD 8 DVNC (TROCAR) ×3 IMPLANT
PACK LAP CHOLE LZT030E (CUSTOM PROCEDURE TRAY) ×3 IMPLANT
PENCIL HANDSWITCHING (ELECTRODE) ×3 IMPLANT
SCISSORS MNPLR CVD DVNC XI (INSTRUMENTS) ×3 IMPLANT
SEAL UNIV 5-12 XI (MISCELLANEOUS) ×9 IMPLANT
SET BASIN LINEN APH (SET/KITS/TRAYS/PACK) ×3 IMPLANT
SET TUBE DA VINCI INSUFFLATOR (TUBING) IMPLANT
SOL PREP POV-IOD 4OZ 10% (MISCELLANEOUS) ×3 IMPLANT
SUT MNCRL AB 4-0 PS2 18 (SUTURE) ×3 IMPLANT
SUT STRATA 3-0 SH (SUTURE) ×6 IMPLANT
SUT VIC AB 2-0 SH 27X BRD (SUTURE) IMPLANT
SYR 30ML LL (SYRINGE) ×3 IMPLANT
TAPE TRANSPORE STRL 2 31045 (GAUZE/BANDAGES/DRESSINGS) ×3 IMPLANT
TRAY FOL W/BAG SLVR 16FR STRL (SET/KITS/TRAYS/PACK) ×3 IMPLANT
WATER STERILE IRR 500ML POUR (IV SOLUTION) ×3 IMPLANT

## 2024-01-06 NOTE — Discharge Instructions (Addendum)
 Ambulatory Surgery Discharge Instructions  General Anesthesia or Sedation Do not drive or operate heavy machinery for 24 hours.  Do not consume alcohol, tranquilizers, sleeping medications, or any non-prescribed medications for 24 hours. Do not make important decisions or sign any important papers in the next 24 hours. You should have someone with you tonight at home.  Activity  You are advised to go directly home from the hospital.  Restrict your activities and rest for a day.  Resume light activity tomorrow. No heavy lifting over 10 lbs or strenuous exercise.  Fluids and Diet Begin with clear liquids, bouillon, dry toast, soda crackers.  If not nauseated, you may go to a regular diet when you desire.  Greasy and spicy foods are not advised.  Medications  If you have not had a bowel movement in 24 hours, take 2 tablespoons over the counter Milk of mag.             You May resume your blood thinners tomorrow (Aspirin, coumadin, or other).  You are being discharged with prescriptions for Opioid/Narcotic Medications: There are some specific considerations for these medications that you should know. Opioid Meds have risks & benefits. Addiction to these meds is always a concern with prolonged use Take medication only as directed Do not drive while taking narcotic pain medication Do not crush tablets or capsules Do not use a different container than medication was dispensed in Lock the container of medication in a cool, dry place out of reach of children and pets. Opioid medication can cause addiction Do not share with anyone else (this is a felony) Do not store medications for future use. Dispose of them properly.     Disposal:  Find a [employer] household drug take back site near you.  If you can't get to a drug take back site, use the recipe below as a last resort to dispose of expired, unused or unwanted drugs. Disposal  (Do not dispose chemotherapy drugs this way, talk to your  prescribing doctor instead.) Step 1: Mix drugs (do not crush) with dirt, kitty litter, or used coffee grounds and add a small amount of water to dissolve any solid medications. Step 2: Seal drugs in plastic bag. Step 3: Place plastic bag in trash. Step 4: Take prescription container and scratch out personal information, then recycle or throw away.  Operative Site  You have a liquid bandage over your incisions, this will begin to flake off in about a week. Ok to English as a second language teacher. Keep wound clean and dry. No baths or swimming. No lifting more than 10 pounds.  Contact Information: If you have questions or concerns, please call our office, [phone], Monday- Thursday 8AM-5PM and Friday 8AM-12Noon.  If it is after hours or on the weekend, please call [facility] Main [facility], [phone], and ask to speak to the surgeon on call for Dr. [facility].   SPECIFIC COMPLICATIONS TO WATCH FOR: Inability to urinate Fever over 101? F by mouth Nausea and vomiting lasting longer than 24 hours. Pain not relieved by medication ordered Swelling around the operative site Increased redness, warmth, hardness, around operative area Numbness, tingling, or cold fingers or toes Blood -soaked dressing, (small amounts of oozing may be normal) Increasing and progressive drainage from surgical area or exam site  Post Anesthesia Home Care Instructions  Activity: Get plenty of rest for the remainder of the day. A responsible individual must stay with you for 24 hours following the procedure.  For the next 24  hours, DO NOT: -Drive a car -Advertising copywriter -Drink alcoholic beverages -Take any medication unless instructed by your physician -Make any legal decisions or sign important papers.  Meals: Start with liquid foods such as gelatin or soup. Progress to regular foods as tolerated. Avoid greasy, spicy, heavy foods. If nausea and/or vomiting occur, drink only clear liquids until the nausea and/or vomiting  subsides. Call your physician if vomiting continues.  Special Instructions/Symptoms: Your throat may feel dry or sore from the anesthesia or the breathing tube placed in your throat during surgery. If this causes discomfort, gargle with warm salt water. The discomfort should disappear within 24 hours.

## 2024-01-06 NOTE — Interval H&P Note (Signed)
 History and Physical Interval Note:  01/06/2024 7:26 AM  Matthew Payne  has presented today for surgery, with the diagnosis of INGUINAL HERNIA, LEFT.  The various methods of treatment have been discussed with the patient and family. After consideration of risks, benefits and other options for treatment, the patient has consented to  Procedure(s): REPAIR, HERNIA, INGUINAL, ROBOT-ASSISTED, LAPAROSCOPIC, USING MESH (Left) as a surgical intervention.  The patient's history has been reviewed, patient examined, no change in status, stable for surgery.  I have reviewed the patient's chart and labs.  Questions were answered to the patient's satisfaction.     [name] A Onetta Spainhower

## 2024-01-06 NOTE — Transfer of Care (Signed)
 Immediate Anesthesia Transfer of Care Note  Patient: Matthew Payne  Procedure(s) Performed: REPAIR, HERNIA, INGUINAL, ROBOT-ASSISTED, LAPAROSCOPIC, USING MESH (Left: Inguinal) LYSIS, ADHESIONS, ROBOT-ASSISTED, LAPAROSCOPIC  Patient Location: PACU  Anesthesia Type:General  Level of Consciousness: awake, alert , and patient cooperative  Airway & Oxygen Therapy: Patient Spontanous Breathing and Patient connected to nasal cannula oxygen  Post-op Assessment: Report given to RN, Post -op Vital signs reviewed and stable, and Patient moving all extremities X 4  Post vital signs: Reviewed and stable  Last Vitals:  Vitals Value Taken Time  BP 119/66 01/06/24 0943  Temp 97.4   Pulse 68 01/06/24 0943  Resp 8 01/06/24 0943  SpO2 99 % 01/06/24 0943  Vitals shown include unfiled device data.  Last Pain:  Vitals:   01/06/24 0645  PainSc: 0-No pain         Complications: No notable events documented.

## 2024-01-06 NOTE — Progress Notes (Signed)
[facility]  Spoke with the patient's son on the phone.  I explained that he tolerated the procedure without difficulty.  He has dissolvable stitches under the skin with overlying skin glue.  This will flake off in 10 to 14 days.  I discharged him home with a prescription for narcotic pain medication that they should take as needed for pain.  I also want him taking scheduled Tylenol.  If they take the narcotic pain medication, they should take a stool softener as well.  The patient will follow-up with me in 2 weeks.  All questions were answered to his expressed satisfaction.  Theophilus Kinds, DO [facility] [address] [place], [place] [address] [phone] (office)

## 2024-01-06 NOTE — Op Note (Signed)
 [facility] Operative Note  01/06/24  Preoperative Diagnosis: Left inguinal Hernia   Postoperative Diagnosis: Left inguinal hernia, intra-abdominal adhesions   Procedure(s) Performed: Robotic assisted laparoscopic left inguinal hernia repair with mesh, Lysis of adhesions   Surgeon: Theophilus Kinds, DO   Assistants: No qualified resident was available    Anesthesia: General endotracheal   Anesthesiologist: Dr. Leta Jungling   Specimens: None   Estimated Blood Loss: Minimal   Blood Replacement: None    Complications: None   Wound Class: Clean   Operative Indications: The patient has a left inguinal hernia that is symptomatic and they want repaired. We discussed robotic assisted laparoscopic inguinal hernia repair and risk of bleeding, infection, issues with chronic pain post operatively, use of mesh, risk of recurrence, chance of needing to repair a bilateral hernia, risk of injury to bowel or bladder, and risk of injury to cord structures for male patients.   Findings: Vas Deferens and cord structures identified and preserved Progrip Laparoscopic Mesh Intra-abdominal adhesions to the pelvis and left groin Hemostasis achieved   Procedure: The patient was taken to the operating room and placed supine. General endotracheal anesthesia was induced. Intravenous antibiotics were administered per protocol.  A foley catheter was placed and a orogastric tube positioned to decompress the stomach. The abdomen was prepared and draped in the usual sterile fashion.  A time-out was completed verifying correct patient, procedure, site, positioning, and implant(s) and/or special equipment prior to beginning this procedure.  An incision was marked 20 cm above the pubic tubercle, slightly above the umbilicus. Veress needle inserted at the supraumbilical site.  Saline drop test noted to be positive with gradual increase in pressure after initiation of gas insufflation.  15 mm of  pressure was achieved prior to removing the Veress needle and then placing a 8 mm port via the Optiview technique through the supraumbilical site that had been previously marked.  Inspection of the area afterwards noted no injury to the surrounding organs during insertion of the needle and the port.  2 port sites were marked 8 cm to the lateral sides of the initial port, and a 8 mm robotic port was placed on the left side, another 8 mm robotic port on the right side under direct supervision. The [employer] platform was then brought into the operative field and docked to the ports.  There were adhesions to the pelvis and left groin with the sigmoid adhesed to the abdominal wall.  These adhesions were taken down with a combination of electrocautery, sharp and blunt dissection.  Examination of the abdominal cavity noted a left inguinal hernia.  A peritoneal flap was created approximately 8 cm cephalad to the defect by using scissors with electrocautery.  Dissection was carried down towards the pubic tubercle, developing the myopectineal orifice view. Laterally the flap was carried towards the ASIS.  Both a small indirect hernia and a larger direct hernia sac were noted, which carefully dissected away from the adjacent tissues to be fully reduced out of hernia cavity.  Any bleeding was controlled with combination of electrocautery and manual pressure.   After confirming adequate dissection and the peritoneal reflection completely down and away from the cord structures, a Progrip laparoscopic mesh was placed within the anterior abdominal wall.  After noting proper placement of the mesh with the peritoneal reflection deep to it, the previously created peritoneal flap was secured back up to the anterior abdominal wall using running 3-0 Stratafix.  Any holes created in the peritoneal flap  were closed with 3-0 Vicryl.  All needles were then removed out of the abdominal cavity, Xi platform undocked from the ports and  removed off of operative field.  Marcaine was instilled at the incision sites.   The abdomen was then desufflated and ports removed. All skin incisions were closed with a subcuticular stitch of Monocryl 4-0. Dermabond was applied. The testis was gently pulled down into its anatomic position in the scrotum.  Final inspection revealed acceptable hemostasis. All counts were correct at the end of the case. The patient was awakened from anesthesia and extubated without complication.  The patient went to the PACU in stable condition.   Theophilus Kinds, DO [facility] [address] [place], [place] [address] [phone] (office)

## 2024-01-06 NOTE — Anesthesia Postprocedure Evaluation (Signed)
 Anesthesia Post Note  Patient: Matthew Payne  Procedure(s) Performed: REPAIR, HERNIA, INGUINAL, ROBOT-ASSISTED, LAPAROSCOPIC, USING MESH (Left: Inguinal) LYSIS, ADHESIONS, ROBOT-ASSISTED, LAPAROSCOPIC  Patient location during evaluation: PACU Anesthesia Type: General Level of consciousness: awake and alert Pain management: pain level controlled Vital Signs Assessment: post-procedure vital signs reviewed and stable Respiratory status: spontaneous breathing, nonlabored ventilation, respiratory function stable and patient connected to nasal cannula oxygen Cardiovascular status: blood pressure returned to baseline and stable Postop Assessment: no apparent nausea or vomiting Anesthetic complications: no   There were no known notable events for this encounter.   Last Vitals:  Vitals:   01/06/24 1025 01/06/24 1030  BP:  (!) 112/57  Pulse: 60 65  Resp: 14 13  Temp:  (!) 35.7 C  SpO2: 99% 95%    Last Pain:  Vitals:   01/06/24 1025  PainSc: 6                  Christiona Siddique L Taylin Leder

## 2024-01-07 ENCOUNTER — Encounter (HOSPITAL_COMMUNITY): Payer: Self-pay | Admitting: Surgery

## 2024-01-11 DIAGNOSIS — R609 Edema, unspecified: Secondary | ICD-10-CM | POA: Diagnosis not present

## 2024-01-11 DIAGNOSIS — E663 Overweight: Secondary | ICD-10-CM | POA: Diagnosis not present

## 2024-01-11 DIAGNOSIS — Z6825 Body mass index (BMI) 25.0-25.9, adult: Secondary | ICD-10-CM | POA: Diagnosis not present

## 2024-01-11 DIAGNOSIS — G8929 Other chronic pain: Secondary | ICD-10-CM | POA: Diagnosis not present

## 2024-01-19 ENCOUNTER — Ambulatory Visit (INDEPENDENT_AMBULATORY_CARE_PROVIDER_SITE_OTHER): Admitting: Surgery

## 2024-01-19 ENCOUNTER — Encounter: Payer: Self-pay | Admitting: Surgery

## 2024-01-19 VITALS — BP 117/67 | HR 82 | Temp 98.0°F | Resp 16 | Ht 72.0 in | Wt 182.0 lb

## 2024-01-19 DIAGNOSIS — Z09 Encounter for follow-up examination after completed treatment for conditions other than malignant neoplasm: Secondary | ICD-10-CM

## 2024-01-19 NOTE — Progress Notes (Unsigned)
 [facility] Note   HPI:  78 y.o. Male presents to clinic for post-op follow-up status post robotic assisted laparoscopic left inguinal hernia repair with mesh and lysis of adhesions on 3/20.  Patient has overall been doing well since the surgery.  His biggest complaint is pain in his left groin that radiates down into his scrotum and testicle when he is more active.  He is tolerating a diet without nausea and vomiting.  He is moving his bowels without issue.  He denies any issues with his incision sites, and denies fevers and chills.  Review of Systems:  All other review of systems: otherwise negative   Vital Signs:  BP 117/67   Pulse 82   Temp 98 F (36.7 C) (Oral)   Resp 16   Ht 6' (1.829 m)   Wt 182 lb (82.6 kg)   SpO2 95%   BMI 24.68 kg/m    Physical Exam:  Physical Exam Vitals reviewed.  Constitutional:      Appearance: Normal appearance.  Abdominal:     Comments: Abdomen soft, nondistended, no percussion tenderness, nontender to palpation; no rigidity, guarding, rebound tenderness; welled healed laparoscopic incision sites with skin glue in place, no bruising or edema to left groin/scrotum  Neurological:     Mental Status: He is alert.     Laboratory studies: None  Imaging:  None  Assessment:  78 y.o. yo Male who presents for follow-up status post robotic assisted laparoscopic left inguinal hernia repair with mesh and lysis of adhesions on 3/20  Plan:  -Patient overall doing well since his surgery.  Tolerating a diet, moving his bowels, and pain well-controlled -Advised that he is going to have some groin pain that could potentially radiate into his scrotum as he becomes more active.  If the activity that he is performing is causing him significant pain, he should back off -His groin pain will continue to slowly improve as he gets farther out from surgery -He can try icing his groin in the evenings after he has been more active to see if this  alleviates some of his pain -Take it easy for the next 2 weeks until he is 4 weeks out from surgery, then he can start increasing his activities back to what he was doing preoperatively -Follow-up with me as needed  All of the above recommendations were discussed with the patient, and all of patient's questions were answered to his expressed satisfaction.  Note: Portions of this report may have been transcribed using voice recognition software. Every effort has been made to ensure accuracy; however, inadvertent computerized transcription errors may still be present.   Theophilus Kinds, DO [facility] [address] [place] [place], [place] [address] [phone] (office) via the paper for Mr. Lowell Guitar

## 2024-02-23 DIAGNOSIS — Z08 Encounter for follow-up examination after completed treatment for malignant neoplasm: Secondary | ICD-10-CM | POA: Diagnosis not present

## 2024-02-23 DIAGNOSIS — Z1283 Encounter for screening for malignant neoplasm of skin: Secondary | ICD-10-CM | POA: Diagnosis not present

## 2024-02-23 DIAGNOSIS — D225 Melanocytic nevi of trunk: Secondary | ICD-10-CM | POA: Diagnosis not present

## 2024-02-23 DIAGNOSIS — D485 Neoplasm of uncertain behavior of skin: Secondary | ICD-10-CM | POA: Diagnosis not present

## 2024-02-23 DIAGNOSIS — Z85820 Personal history of malignant melanoma of skin: Secondary | ICD-10-CM | POA: Diagnosis not present

## 2024-03-01 ENCOUNTER — Other Ambulatory Visit: Payer: Medicare Other

## 2024-03-01 DIAGNOSIS — R972 Elevated prostate specific antigen [PSA]: Secondary | ICD-10-CM

## 2024-03-01 DIAGNOSIS — C61 Malignant neoplasm of prostate: Secondary | ICD-10-CM | POA: Diagnosis not present

## 2024-03-02 ENCOUNTER — Ambulatory Visit: Payer: Self-pay

## 2024-03-02 LAB — PSA: Prostate Specific Ag, Serum: 1.6 ng/mL (ref 0.0–4.0)

## 2024-03-09 ENCOUNTER — Ambulatory Visit: Payer: Medicare Other | Admitting: Urology

## 2024-03-16 ENCOUNTER — Ambulatory Visit: Payer: Medicare Other | Admitting: Urology

## 2024-03-16 ENCOUNTER — Encounter: Payer: Self-pay | Admitting: Urology

## 2024-03-16 VITALS — BP 118/53 | HR 78

## 2024-03-16 DIAGNOSIS — R972 Elevated prostate specific antigen [PSA]: Secondary | ICD-10-CM | POA: Diagnosis not present

## 2024-03-16 DIAGNOSIS — C61 Malignant neoplasm of prostate: Secondary | ICD-10-CM | POA: Diagnosis not present

## 2024-03-16 DIAGNOSIS — N403 Nodular prostate with lower urinary tract symptoms: Secondary | ICD-10-CM

## 2024-03-16 DIAGNOSIS — R3915 Urgency of urination: Secondary | ICD-10-CM

## 2024-03-16 DIAGNOSIS — Z87440 Personal history of urinary (tract) infections: Secondary | ICD-10-CM

## 2024-03-16 DIAGNOSIS — N5201 Erectile dysfunction due to arterial insufficiency: Secondary | ICD-10-CM | POA: Diagnosis not present

## 2024-03-16 MED ORDER — TADALAFIL 5 MG PO TABS: ORAL_TABLET | ORAL | 11 refills | Status: AC

## 2024-03-16 MED ORDER — TAMSULOSIN HCL 0.4 MG PO CAPS: 0.8000 mg | ORAL_CAPSULE | Freq: Every day | ORAL | 3 refills | Status: DC

## 2024-03-16 NOTE — Progress Notes (Unsigned)
 Subjective:  1. Prostate cancer (HCC)   2. Elevated PSA   3. Nodular prostate with lower urinary tract symptoms   4. Urgency of urination   5. Erectile dysfunction due to arterial insufficiency   6. Personal history of urinary infection     03/16/24: Matthew Payne returns today in f/u.  He had a PAE on 10/26/23. He is doing well with an IPSS of 6 with some urgency.   He has a better stream. He has had no hematuria.  He remains on tamsulosin . 0.8mg  daily.  He has low risk prostate cancer and his PSA has declined to 1.6 from 7.1 with the PAE.  He has ED and has been on    09/02/23: Matthew Payne returns today in f/u. His UA is clear today.  He hasn't had a PSA prior to this visit. It was scheduled for 11/23.  He has stable LUTS with an IPSS of 12.  He has nocturia x 1.  He remains on tamsulosin  0.8mg  daily.   He has ED and has scripts for tadalafil  and sildenafil  but hasn't been using them much.    06/17/23:  Matthew Payne returns today with concerns for a UTI.  He saw his PCP yesterday and had a dip UA that was Nit+ with trace LE.  His UA today has >30 WBC.  He was started on Cipro  yesterday.  HE was having dysuria but no hematuria.  He had no frequency or urgency.  He had increased nocturia last week but that is better.  He feels like he is emptying ok.    He had some discomfort in the left groin but has a small LIH.   He remains on tamsulosin  0.8mg .   03/11/23: Matthew Payne returns today in f/u.  He has low risk prostate cancer on surveillance since biopsy in 4/22 and has a nodular prostate with obstruction.  He had an MRI on 5/8 that showed a prostate with no high risk lesions.  His PSA is back down to 5.4 after jumping to 12.5 after a bout of COVID.   He remains on tamsulosin  0.8mg  and his IPSS is 12.  He has frequency and urgency with a sensation of incomplete emptying.  He has minimal nocturia.  He remains on tadalafil  and sildenafil .    11/26/22: Matthew Payne returns today in f/u.  He had the repeat PSA on 10/21/22 with his  PCP and it was 12.1 which is down slightly.  He has low risk prostate cancer on surveillance since biopsy in 4/22 and has a nodular prostate with obstruction.  His PSA was up to 12.5 in November after coming off of finasteride  for side effects.  He is on tamsulosin  for the LUTS and tadalafil  and sildenafil  for the ED. He had a low risk MRI with a prostate in 5/23.   His IPSS is 15 with urgency.   He had COVID around Christmas.   He had a transient episode of voiding difficulty prior to the episode of COVID but it resolved spontaneously.   08/27/22: Matthew Payne returns today in f/u.  He was started on finasteride  in May and his PSA fell from 10.5 in 4/23  to 5.9 in 8/23  but he was having sexual side effects and the PSa is now back up to 12.5.  His PSA has been rather labile in the past and he has a prostate on MRI.  He has low risk prostate cancer with a single core of GG1 disease with 5% involvement on his biopsy  from 01/30/21.   05/28/22: Matthew Payne returns today in f/u for history of low risk prostate cancer on AS.  His prostate was on his last MRIP.  His PSA is down to 5.9 with the addition of finasteride  on 02/19/22.  He remains on tamsulosin  as well.  His IPSS is 11 with reduced nocturia but he reports progressive ED since starting it. He was benefiting from sildenafil  and tadalafil  but those aren't working now.    02/19/22: Matthew Payne returns today in f/u.  He had a repeat MRIP on 02/16/22 that showed a prostate with no high risk lesions.  He remains on AS for low risk prostate cancer that was diagnosed on 01/30/21.  His PSA is 10.5 which is up from 8.4 in 10/22 but it was 10.8 on 05/22/21.   He is voiding better with reduced nocturia. He has some urgency.  He has no dysuria or hematuria.   His IPSS is 15.  He has some issues with urgency but minimal nocturia.  He remains on tamsulosin  but is off of the tadalafil .   He was having some dizziness.  He wasn't responding to the tadalafil .  He was given  sildenafil  in 8/22 but didn't have a response either.    02/14/21: Matthew Payne returns today in f/u to discuss his prostate biopsy results.   He was found to have a 48 ml prostate with a single core with 5% Gleason 6 prostate cancer with PNI.  There was another core with atypia at the left apex.   He has T2a Nx Mx disease.  He has preexisting ED and BOO.   He had an MRIP in 5/20 that showed an 80ml prostate with no suspicious lesions. He does have a middle lobe that is primarily on the right on MRI.     GU Hx: Matthew Payne is a 78 yo WM who is sent by Dr. Glady Laming for an elevated PSA of 4.1. His PSA has been rising steadily for the last few years and was 1.58 in 2011. He had a biopsy for a nodule about 15 years ago. He has moderate LUTS with an IPSS of 11. He is currently on tadalafil  which was started about 2 months ago and has helped his symptoms. He has had no hematuria or dysuria. He had a foley in 7/19 when hospitalized and [facility] with acute pancreatitis possibly from alcohol. He had mild pyuria but a negative culture. He stopped drinking after that. He has a history of Peyronie's disease and had a phalloplasty in 2008 by Dr. Ottelin.      IPSS     Row Name 03/16/24 1500         International Prostate Symptom Score   How often have you had the sensation of not emptying your bladder? Less than 1 in 5     How often have you had to urinate less than every two hours? Less than 1 in 5 times     How often have you found you stopped and started again several times when you urinated? Not at All     How often have you found it difficult to postpone urination? About half the time     How often have you had a weak urinary stream? Less than 1 in 5 times     How often have you had to strain to start urination? Not at All     How many times did you typically get up at night to urinate? None  Total IPSS Score 6       Quality of Life due to urinary symptoms   If you were to spend the rest of your life with  your urinary condition just the way it is now how would you feel about that? Mixed                     ROS:  ROS:  A complete review of systems was performed.  All systems are negative except for pertinent findings as noted.   Review of Systems  All other systems reviewed and are negative.   No Known Allergies  Outpatient Encounter Medications as of 03/16/2024  Medication Sig   Bioflavonoid Products (ESTER C PO) Take 1,000 mg by mouth daily.   docusate sodium  (COLACE) 100 MG capsule Take 1 capsule (100 mg total) by mouth 2 (two) times daily.   enalapril (VASOTEC) 10 MG tablet Take 10 mg by mouth daily.   furosemide (LASIX) 40 MG tablet Take 40 mg by mouth daily as needed.   oxyCODONE  (OXY IR/ROXICODONE ) 5 MG immediate release tablet Take 1 tablet (5 mg total) by mouth every 6 (six) hours as needed for severe pain (pain score 7-10).   pravastatin  (PRAVACHOL ) 40 MG tablet Take 40 mg by mouth daily.   sildenafil  (REVATIO ) 20 MG tablet Take 2-3 tabs po prn in combination with 10mg  tadalafil    zolpidem (AMBIEN) 10 MG tablet Take 10 mg by mouth at bedtime.   [DISCONTINUED] tadalafil  (CIALIS ) 5 MG tablet Take up to 4 tablets po as needed daily or up to 2 tablets daily po with 2-3 20mg  sildenafil  as needed for sexual activity.  Do not take within 4 hours of tamsulosin /flomax .   [DISCONTINUED] tamsulosin  (FLOMAX ) 0.4 MG CAPS capsule Take 2 capsules (0.8 mg total) by mouth daily.   tadalafil  (CIALIS ) 5 MG tablet Take up to 4 tablets po as needed daily or up to 2 tablets daily po with 2-3 20mg  sildenafil  as needed for sexual activity.  Do not take within 4 hours of tamsulosin /flomax .   tamsulosin  (FLOMAX ) 0.4 MG CAPS capsule Take 2 capsules (0.8 mg total) by mouth daily.   [DISCONTINUED] diazepam  (VALIUM ) 5 MG tablet Take 1-2 po one hour prior to MRI (Patient not taking: Reported on 12/29/2023)   [DISCONTINUED] ibuprofen  (ADVIL ) 600 MG tablet Take 1 tablet (600 mg total) by mouth every 6  (six) hours as needed. (Patient not taking: Reported on 12/29/2023)   [DISCONTINUED] ondansetron  (ZOFRAN -ODT) 4 MG disintegrating tablet Take 1 tablet (4 mg total) by mouth every 8 (eight) hours as needed for nausea or vomiting. (Patient not taking: Reported on 12/21/2023)   No facility-administered encounter medications on file as of 03/16/2024.    Past Medical History:  Diagnosis Date   Hypercholesteremia    Hypertension     Past Surgical History:  Procedure Laterality Date   CATARACT EXTRACTION, BILATERAL  5 yrs ago   COLONOSCOPY  05/28/2011   Procedure: COLONOSCOPY;  Surgeon: Ruby Corporal, MD;  Location: [facility];  Service: Endoscopy;  Laterality: N/A;   COLONOSCOPY N/A 07/31/2021   Procedure: COLONOSCOPY;  Surgeon: Ruby Corporal, MD;  Location: [facility];  Service: Endoscopy;  Laterality: N/A;  13:00   CYSTECTOMY     back & leg   INGUINAL HERNIA REPAIR  07/01/2012   Procedure: HERNIA REPAIR INGUINAL ADULT;  Surgeon: Beau Bound, MD;  Location: [facility] OR[facility];  Service: General;  Laterality: Right;  Right Inguinal Herniorraphy with  Mesh   IR 3D INDEPENDENT WKST  10/26/2023   IR ANGIOGRAM PELVIS SELECTIVE OR SUPRASELECTIVE  10/26/2023   IR ANGIOGRAM SELECTIVE EACH ADDITIONAL VESSEL  10/26/2023   IR ANGIOGRAM SELECTIVE EACH ADDITIONAL VESSEL  10/26/2023   IR EMBO TUMOR ORGAN ISCHEMIA INFARCT INC GUIDE ROADMAPPING  10/26/2023   IR RADIOLOGIST EVAL & MGMT  09/08/2023   IR RADIOLOGIST EVAL & MGMT  12/08/2023   IR US  GUIDE VASC ACCESS LEFT  10/26/2023   IR US  GUIDE VASC ACCESS LEFT  10/26/2023   IR US  GUIDE VASC ACCESS LEFT  10/26/2023   Nesbit  Plication     POLYPECTOMY  07/31/2021   Procedure: POLYPECTOMY;  Surgeon: Ruby Corporal, MD;  Location: [facility];  Service: Endoscopy;;   ROBOTIC ASSISTED LAPAROSCOPIC LYSIS OF ADHESION N/A 01/06/2024   Procedure: LYSIS, ADHESIONS, ROBOT-ASSISTED, LAPAROSCOPIC;  Surgeon: Marijo Shove, DO;  Location: [facility];  Service: General;   Laterality: N/A;   TONSILLECTOMY     XI ROBOTIC ASSISTED INGUINAL HERNIA REPAIR WITH MESH Left 01/06/2024   Procedure: REPAIR, HERNIA, INGUINAL, ROBOT-ASSISTED, LAPAROSCOPIC, USING MESH;  Surgeon: Marijo Shove, DO;  Location: [facility];  Service: General;  Laterality: Left;    Social History   Socioeconomic History   Marital status: Married    Spouse name: Not on file   Number of children: Not on file   Years of education: Not on file   Highest education level: Not on file  Occupational History   Not on file  Tobacco Use   Smoking status: Former    Current packs/day: 0.00    Types: Cigarettes    Quit date: 02/24/1978    Years since quitting: 46.0   Smokeless tobacco: Never  Vaping Use   Vaping status: Never Used  Substance and Sexual Activity   Alcohol use: Not Currently   Drug use: No   Sexual activity: Yes  Other Topics Concern   Not on file  Social History Narrative   Not on file   Social Drivers of Health   Financial Resource Strain: Not on file  Food Insecurity: Not on file  Transportation Needs: Not on file  Physical Activity: Not on file  Stress: Not on file  Social Connections: Unknown (03/03/2022)   Received from [facility]   Social Network    Social Network: Not on file  Intimate Partner Violence: Unknown (01/23/2022)   Received from [employer], [facility]   HITS    Physically Hurt: Not on file    Insult or Talk Down To: Not on file    Threaten Physical Harm: Not on file    Scream or Curse: Not on file    History reviewed. No pertinent family history.     Objective: Vitals:   03/16/24 1520  BP: (!) 118/53  Pulse: 78      Physical Exam Vitals reviewed.  Constitutional:      Appearance: Normal appearance.  Neurological:     Mental Status: He is alert.     Lab Results:  No results found for this or any previous visit (from the past 24 hours).    His UA from yesterday was reviewed along with the one for  today.      Lab Results  Component Value Date   PSA1 1.6 03/01/2024   PSA1 7.1 (H) 09/02/2023   PSA1 5.4 (H) 03/01/2023  PSA 12.1 10/21/22.   BMET No results for input(s): "NA", "K", "CL", "CO2", "GLUCOSE", "BUN", "  CREATININE", "CALCIUM" in the last 72 hours. PSA  09/30/20: 6.4.  01/07/21:   7.7 with a 21.6% f/t ratio.  01/14/21   8.0  with 19% f/t ratio.  No results found. I reviewed the CT from 2019 and the hernia on the left was present.  Assessment & Plan:  History of UTI.  Urine is clear today.   Prostate cancer.   His PSA is dramatically lower post PAE.   Repeat in 6 months.    Nodular prostate with BOO.   He is doing well s/p PAE so I will have him try to wean the tamsulosin  but refilled it in case he can't.   ED.  Tadalafil  refilled.      Meds ordered this encounter  Medications   tadalafil  (CIALIS ) 5 MG tablet    Sig: Take up to 4 tablets po as needed daily or up to 2 tablets daily po with 2-3 20mg  sildenafil  as needed for sexual activity.  Do not take within 4 hours of tamsulosin /flomax .    Dispense:  30 tablet    Refill:  11   tamsulosin  (FLOMAX ) 0.4 MG CAPS capsule    Sig: Take 2 capsules (0.8 mg total) by mouth daily.    Dispense:  180 capsule    Refill:  3    Don't fill until patient calls.     Orders Placed This Encounter  Procedures   PSA, total and free    Standing Status:   Future    Expected Date:   09/16/2024    Expiration Date:   03/16/2025      Return in about 6 months (around 09/16/2024) for with available MD> .   CC: Minus Amel, MD      Homero Luster 03/17/2024 Patient ID: Matthew Payne, male   DOB: 08/19/1946, 78 y.o.   MRN: [id number]

## 2024-03-24 DIAGNOSIS — R7309 Other abnormal glucose: Secondary | ICD-10-CM | POA: Diagnosis not present

## 2024-03-24 DIAGNOSIS — Z6825 Body mass index (BMI) 25.0-25.9, adult: Secondary | ICD-10-CM | POA: Diagnosis not present

## 2024-03-24 DIAGNOSIS — I10 Essential (primary) hypertension: Secondary | ICD-10-CM | POA: Diagnosis not present

## 2024-03-24 DIAGNOSIS — M25512 Pain in left shoulder: Secondary | ICD-10-CM | POA: Diagnosis not present

## 2024-03-24 DIAGNOSIS — E782 Mixed hyperlipidemia: Secondary | ICD-10-CM | POA: Diagnosis not present

## 2024-03-24 DIAGNOSIS — E7849 Other hyperlipidemia: Secondary | ICD-10-CM | POA: Diagnosis not present

## 2024-05-05 ENCOUNTER — Encounter: Payer: Self-pay | Admitting: Advanced Practice Midwife

## 2024-06-05 ENCOUNTER — Emergency Department (HOSPITAL_COMMUNITY)
Admission: EM | Admit: 2024-06-05 | Discharge: 2024-06-05 | Disposition: A | Discharge status: Home / Self Care | Attending: Emergency Medicine | Admitting: Emergency Medicine

## 2024-06-05 ENCOUNTER — Other Ambulatory Visit: Payer: Self-pay

## 2024-06-05 ENCOUNTER — Emergency Department (HOSPITAL_COMMUNITY)

## 2024-06-05 DIAGNOSIS — W540XXA Bitten by dog, initial encounter: Secondary | ICD-10-CM | POA: Insufficient documentation

## 2024-06-05 DIAGNOSIS — S61452A Open bite of left hand, initial encounter: Secondary | ICD-10-CM | POA: Diagnosis not present

## 2024-06-05 DIAGNOSIS — M19042 Primary osteoarthritis, left hand: Secondary | ICD-10-CM | POA: Diagnosis not present

## 2024-06-05 DIAGNOSIS — S61052A Open bite of left thumb without damage to nail, initial encounter: Secondary | ICD-10-CM | POA: Insufficient documentation

## 2024-06-05 MED ORDER — AMOXICILLIN-POT CLAVULANATE 875-125 MG PO TABS
1.0000 | ORAL_TABLET | Freq: Two times a day (BID) | ORAL | 0 refills | Status: DC
Start: 2024-06-05 — End: 2024-08-14

## 2024-06-05 MED ORDER — IBUPROFEN 600 MG PO TABS: 600.0000 mg | ORAL_TABLET | Freq: Three times a day (TID) | ORAL | 0 refills | Status: DC

## 2024-06-05 MED ORDER — IBUPROFEN 800 MG PO TABS
800.0000 mg | ORAL_TABLET | Freq: Once | ORAL | Status: AC
Start: 2024-06-05 — End: 2024-06-05
  Administered 2024-06-05: 800 mg via ORAL
  Filled 2024-06-05: qty 1

## 2024-06-05 MED ORDER — AMOXICILLIN-POT CLAVULANATE 875-125 MG PO TABS
1.0000 | ORAL_TABLET | Freq: Once | ORAL | Status: AC
  Administered 2024-06-05: 1 via ORAL
  Filled 2024-06-05: qty 1

## 2024-06-05 NOTE — ED Triage Notes (Signed)
 Pt arrived POV to ED. Pt complains of bite from his dog that occurred Wednesday. Pt states that he stepped on dog's paw accidentally and the dog bit him on the thumb. Upon inspection, pt's hand is swollen and has difficulty moving hand. Pt states pain is 8/10. Pt states dog has had all required vaccinations and shots.

## 2024-06-05 NOTE — Discharge Instructions (Signed)
 Complete the entire course of the antibiotics prescribed.  You will need close followup with a hand specialist to ensure this infection is resolving.  Call Dr. Alyse for an office visit this week for a recheck of this injury.

## 2024-06-05 NOTE — ED Notes (Signed)
 PA stated no need to call animal control .

## 2024-06-05 NOTE — ED Provider Notes (Signed)
 [place] [facility] DEPARTMENT AT [facility] Provider Note   CSN: [id number] Arrival date & time: 06/05/24  9258     Patient presents with: Animal Bite   Matthew Payne is a 78 y.o. male , right handed, presenting for evaluation of dog bite to left thumb 5 days ago.  Accidentally stepped on his [place] paw who responded with that bite to his left thumb.  He has used soap and water  wash and has also been applying Neosporin ointment but he continues to have pain and increasing swelling around the bite sites and having increasing difficulty bending his thumb due to pain and swelling.  He denies fevers or chills, no other complaints, no pain radiating into his wrist or forearm.  The dog is current with his vaccinations including his rabies vaccine and pt is utd with his tetanus.   Animal Bite Associated symptoms: no fever, no numbness and no rash        Prior to Admission medications   Medication Sig Start Date End Date Taking? Authorizing Provider  amoxicillin -clavulanate (AUGMENTIN ) 875-125 MG tablet Take 1 tablet by mouth every 12 (twelve) hours. 06/05/24  Yes Koal Eslinger, PA-C  ibuprofen  (ADVIL ) 600 MG tablet Take 1 tablet (600 mg total) by mouth 3 (three) times daily. 06/05/24  Yes Joakim Huesman, PA-C  Bioflavonoid Products (ESTER C PO) Take 1,000 mg by mouth daily.    [provider]  docusate sodium  (COLACE) 100 MG capsule Take 1 capsule (100 mg total) by mouth 2 (two) times daily. 01/06/24 01/05/25  Pappayliou, Dorothyann A, DO  enalapril (VASOTEC) 10 MG tablet Take 10 mg by mouth daily.    [provider]  furosemide (LASIX) 40 MG tablet Take 40 mg by mouth daily as needed. 01/11/24   [provider]  oxyCODONE  (OXY IR/ROXICODONE ) 5 MG immediate release tablet Take 1 tablet (5 mg total) by mouth every 6 (six) hours as needed for severe pain (pain score 7-10). 01/06/24   Pappayliou, Dorothyann A, DO  pravastatin  (PRAVACHOL ) 40 MG tablet Take 40  mg by mouth daily. 09/30/19   [provider]  sildenafil  (REVATIO ) 20 MG tablet Take 2-3 tabs po prn in combination with 10mg  tadalafil  08/27/22   Watt Rush, MD  tadalafil  (CIALIS ) 5 MG tablet Take up to 4 tablets po as needed daily or up to 2 tablets daily po with 2-3 20mg  sildenafil  as needed for sexual activity.  Do not take within 4 hours of tamsulosin /flomax . 03/16/24   Watt Rush, MD  tamsulosin  (FLOMAX ) 0.4 MG CAPS capsule Take 2 capsules (0.8 mg total) by mouth daily. 03/16/24   Watt Rush, MD  zolpidem (AMBIEN) 10 MG tablet Take 10 mg by mouth at bedtime. 01/07/22   [provider]    Allergies: Patient has no known allergies.    Review of Systems  Constitutional:  Negative for fever.  HENT:  Negative for congestion and sore throat.   Eyes: Negative.   Respiratory:  Negative for chest tightness and shortness of breath.   Cardiovascular:  Negative for chest pain.  Gastrointestinal:  Negative for abdominal pain and nausea.  Genitourinary: Negative.   Musculoskeletal:  Positive for arthralgias. Negative for joint swelling and neck pain.  Skin:  Positive for color change. Negative for rash and wound.  Neurological:  Negative for dizziness, weakness, light-headedness, numbness and headaches.  Psychiatric/Behavioral: Negative.      Updated Vital Signs BP (!) 143/75   Pulse 83   Temp 97.7 F (  36.5 C) (Oral)   Resp 16   Ht 6' (1.829 m)   Wt 81.6 kg   SpO2 96%   BMI 24.41 kg/m   Physical Exam Vitals and nursing note reviewed.  Constitutional:      Appearance: He is well-developed.  HENT:     Head: Normocephalic and atraumatic.  Eyes:     Conjunctiva/sclera: Conjunctivae normal.  Cardiovascular:     Rate and Rhythm: Normal rate.  Pulmonary:     Effort: Pulmonary effort is normal.     Breath sounds: No wheezing.  Musculoskeletal:        General: Swelling and tenderness present. No deformity.     Left hand: Swelling and tenderness present. Decreased  range of motion. Normal sensation. Normal capillary refill.     Cervical back: Normal range of motion.     Comments: Generalized swelling left thumb and thenar eminence with milder dorsal hand edema.  Subtle erythema around puncture wound thenar eminence.  No red streaking,  FROM wrist and fingers left hand with reduced but not complete loss of ROM thumb. No drainage.   Skin:    General: Skin is warm and dry.  Neurological:     Mental Status: He is alert.        (all labs ordered are listed, but only abnormal results are displayed) Labs Reviewed - No data to display  EKG: None  Radiology: DG Hand Complete Left Result Date: 06/05/2024 CLINICAL DATA:  Dog bite, evaluate for bone injury or foreign body. EXAM: LEFT HAND - COMPLETE 3+ VIEW COMPARISON:  None Available. FINDINGS: No acute osseous or joint abnormality. No radiopaque foreign body. Degenerative changes at the first meta carpal phalangeal joint, interphalangeal joint of the thumb and distal interphalangeal joints of the fingers. IMPRESSION: 1. No fracture or radiopaque foreign body. 2. Osteoarthritis. Electronically Signed   By: Newell Eke M.D.   On: 06/05/2024 08:44     Procedures   Medications Ordered in the ED  amoxicillin -clavulanate (AUGMENTIN ) 875-125 MG per tablet 1 tablet (1 tablet Oral Given 06/05/24 0835)  ibuprofen  (ADVIL ) tablet 800 mg (800 mg Oral Given 06/05/24 0835)                                    Medical Decision Making Patient with increasing swelling and pain in his left hand associated with dog bite from his personal vaccinated dog.  Injury occurred 5 days ago.  He does have soft tissue edema, mild erythema without red streaking and no exam findings to suggest joint or tendon involvement.  He is placed on Augmentin  with first dose given here.  He was advised he will need close follow-up with hand specialty if his symptoms are not improving over the next 48 hours.  He was advised to reach out for an  appointment today for recheck after the 48 hours in the event his symptoms are persisting or worsening.  Patient understands and agrees with this plan.  Amount and/or Complexity of Data Reviewed Radiology: ordered.    Details: Imaging reviewed, agree with interpretation, no joint involvement, no soft tissue gas, no foreign body.  Risk Prescription drug management.        Final diagnoses:  Dog bite, initial encounter    ED Discharge Orders          Ordered    amoxicillin -clavulanate (AUGMENTIN ) 875-125 MG tablet  Every 12 hours  06/05/24 0856    ibuprofen  (ADVIL ) 600 MG tablet  3 times daily        06/05/24 0856               Kartier Bennison, PA-C 06/05/24 0912    Garrick Charleston, MD 06/06/24 1122

## 2024-08-07 DIAGNOSIS — R609 Edema, unspecified: Secondary | ICD-10-CM | POA: Diagnosis not present

## 2024-08-07 DIAGNOSIS — R7309 Other abnormal glucose: Secondary | ICD-10-CM | POA: Diagnosis not present

## 2024-08-07 DIAGNOSIS — M7989 Other specified soft tissue disorders: Secondary | ICD-10-CM | POA: Diagnosis not present

## 2024-08-10 ENCOUNTER — Other Ambulatory Visit: Payer: Self-pay

## 2024-08-10 ENCOUNTER — Other Ambulatory Visit (HOSPITAL_COMMUNITY): Payer: Self-pay | Admitting: Family Medicine

## 2024-08-10 ENCOUNTER — Ambulatory Visit (HOSPITAL_COMMUNITY)
Admission: RE | Admit: 2024-08-10 | Discharge: 2024-08-10 | Disposition: A | Discharge status: Home / Self Care | Source: Ambulatory Visit | Attending: Family Medicine | Admitting: Family Medicine

## 2024-08-10 ENCOUNTER — Emergency Department (HOSPITAL_COMMUNITY)
Admission: EM | Admit: 2024-08-10 | Discharge: 2024-08-10 | Disposition: A | Discharge status: Home / Self Care | Source: Ambulatory Visit | Attending: Emergency Medicine | Admitting: Emergency Medicine

## 2024-08-10 ENCOUNTER — Encounter (HOSPITAL_COMMUNITY): Payer: Self-pay | Admitting: Emergency Medicine

## 2024-08-10 DIAGNOSIS — M7989 Other specified soft tissue disorders: Secondary | ICD-10-CM

## 2024-08-10 DIAGNOSIS — M79661 Pain in right lower leg: Secondary | ICD-10-CM | POA: Diagnosis present

## 2024-08-10 DIAGNOSIS — R609 Edema, unspecified: Secondary | ICD-10-CM

## 2024-08-10 DIAGNOSIS — R7989 Other specified abnormal findings of blood chemistry: Secondary | ICD-10-CM

## 2024-08-10 DIAGNOSIS — I82441 Acute embolism and thrombosis of right tibial vein: Secondary | ICD-10-CM | POA: Diagnosis not present

## 2024-08-10 DIAGNOSIS — I82411 Acute embolism and thrombosis of right femoral vein: Secondary | ICD-10-CM | POA: Insufficient documentation

## 2024-08-10 DIAGNOSIS — I82431 Acute embolism and thrombosis of right popliteal vein: Secondary | ICD-10-CM | POA: Diagnosis not present

## 2024-08-10 DIAGNOSIS — M79604 Pain in right leg: Secondary | ICD-10-CM | POA: Diagnosis not present

## 2024-08-10 HISTORY — DX: Acute embolism and thrombosis of unspecified deep veins of unspecified lower extremity: I82.409

## 2024-08-10 MED ORDER — APIXABAN 5 MG PO TABS
10.0000 mg | ORAL_TABLET | Freq: Once | ORAL | Status: AC
  Administered 2024-08-10: 10 mg via ORAL
  Filled 2024-08-10: qty 2

## 2024-08-10 MED ORDER — APIXABAN 5 MG PO TABS: ORAL_TABLET | ORAL | 0 refills | Status: DC

## 2024-08-10 NOTE — ED Provider Notes (Signed)
 [place] EMERGENCY DEPARTMENT AT [facility] Provider Note   CSN: [id number] Arrival date & time: 08/10/24  1546     Patient presents with: DVT   Matthew Payne is a 78 y.o. male.  He was sent in by his primary care doctor after identifying a DVT.  Patient has had leg swelling on and off for 6 months.  PCP had on diuretics and compression stockings with some improvement.  Right leg remained swollen however.  PCP did labs and ultimately sent him here for an outpatient ultrasound today.  Patient's ultrasound was positive for acute DVT.  Patient's PCP had done lab work and sent him in a prescription for Eliquis.  Patient was unable to afford the Eliquis and so PCP sent him to the emergency department for further evaluation.  He does not endorse any chest pain or shortness of breath.  He now has a voucher for the Eliquis and he thinks he would like to start it.  He had basic lab work done by his PCP and his PCP ordered the Eliquis.    The history is provided by the patient.  Leg Pain Location:  Leg Time since incident:  6 months Injury: no   Leg location:  R lower leg Pain details:    Quality:  Aching   Severity:  Mild   Timing:  Intermittent   Progression:  Unchanged Chronicity:  New Associated symptoms: swelling   Associated symptoms: no fever and no muscle weakness        Prior to Admission medications   Medication Sig Start Date End Date Taking? Authorizing Provider  amoxicillin -clavulanate (AUGMENTIN ) 875-125 MG tablet Take 1 tablet by mouth every 12 (twelve) hours. 06/05/24   Idol, Julie, PA-C  Bioflavonoid Products (ESTER C PO) Take 1,000 mg by mouth daily.    [provider]  docusate sodium  (COLACE) 100 MG capsule Take 1 capsule (100 mg total) by mouth 2 (two) times daily. 01/06/24 01/05/25  Pappayliou, Dorothyann A, DO  enalapril (VASOTEC) 10 MG tablet Take 10 mg by mouth daily.    [provider]  furosemide (LASIX) 40 MG tablet Take 40 mg by  mouth daily as needed. 01/11/24   [provider]  ibuprofen  (ADVIL ) 600 MG tablet Take 1 tablet (600 mg total) by mouth 3 (three) times daily. 06/05/24   Idol, Julie, PA-C  oxyCODONE  (OXY IR/ROXICODONE ) 5 MG immediate release tablet Take 1 tablet (5 mg total) by mouth every 6 (six) hours as needed for severe pain (pain score 7-10). 01/06/24   Pappayliou, Dorothyann A, DO  pravastatin  (PRAVACHOL ) 40 MG tablet Take 40 mg by mouth daily. 09/30/19   [provider]  sildenafil  (REVATIO ) 20 MG tablet Take 2-3 tabs po prn in combination with 10mg  tadalafil  08/27/22   Watt Rush, MD  tadalafil  (CIALIS ) 5 MG tablet Take up to 4 tablets po as needed daily or up to 2 tablets daily po with 2-3 20mg  sildenafil  as needed for sexual activity.  Do not take within 4 hours of tamsulosin /flomax . 03/16/24   Watt Rush, MD  tamsulosin  (FLOMAX ) 0.4 MG CAPS capsule Take 2 capsules (0.8 mg total) by mouth daily. 03/16/24   Watt Rush, MD  zolpidem (AMBIEN) 10 MG tablet Take 10 mg by mouth at bedtime. 01/07/22   [provider]    Allergies: Patient has no known allergies.    Review of Systems  Constitutional:  Negative for fever.    Updated Vital Signs BP (!) 132/50  Pulse 71   Temp 98.2 F (36.8 C) (Oral)   Resp 18   Ht 6' (1.829 m)   Wt 81.6 kg   SpO2 98%   BMI 24.41 kg/m   Physical Exam Vitals and nursing note reviewed.  Constitutional:      Appearance: Normal appearance. He is well-developed.  HENT:     Head: Normocephalic and atraumatic.  Eyes:     Conjunctiva/sclera: Conjunctivae normal.  Cardiovascular:     Rate and Rhythm: Normal rate and regular rhythm.     Heart sounds: No murmur heard. Pulmonary:     Effort: Pulmonary effort is normal. No respiratory distress.     Breath sounds: Normal breath sounds.  Abdominal:     Palpations: Abdomen is soft.     Tenderness: There is no abdominal tenderness.  Musculoskeletal:     Cervical back: Neck supple.     Right lower  leg: Edema present.  Skin:    General: Skin is warm and dry.  Neurological:     General: No focal deficit present.     Mental Status: He is alert.     GCS: GCS eye subscore is 4. GCS verbal subscore is 5. GCS motor subscore is 6.     (all labs ordered are listed, but only abnormal results are displayed) Labs Reviewed - No data to display  EKG: None  Radiology: US  Venous Img Lower Unilateral Right (DVT) Result Date: 08/10/2024 CLINICAL DATA:  Right lower extremity pain and edema. EXAM: RIGHT LOWER EXTREMITY VENOUS DOPPLER ULTRASOUND TECHNIQUE: Gray-scale sonography with graded compression, as well as color Doppler and duplex ultrasound were performed to evaluate the lower extremity deep venous systems from the level of the common femoral vein and including the common femoral, femoral, profunda femoral, popliteal and calf veins including the posterior tibial, peroneal and gastrocnemius veins when visible. The superficial great saphenous vein was also interrogated. Spectral Doppler was utilized to evaluate flow at rest and with distal augmentation maneuvers in the common femoral, femoral and popliteal veins. COMPARISON:  None Available. FINDINGS: Contralateral Common Femoral Vein: Respiratory phasicity is normal and symmetric with the symptomatic side. No evidence of thrombus. Normal compressibility. Common Femoral Vein: No evidence of thrombus. Normal compressibility, respiratory phasicity and response to augmentation. Saphenofemoral Junction: No evidence of thrombus. Normal compressibility and flow on color Doppler imaging. Profunda Femoral Vein: No evidence of thrombus. Normal compressibility and flow on color Doppler imaging. Femoral Vein: Occlusive thrombus identified. Popliteal Vein: Occlusive thrombus identified which is echogenic, implicating some degree of chronicity. Calf Veins: Visualized segments of peroneal and posterior tibial veins demonstrate thrombus. Superficial Great Saphenous  Vein: No evidence of thrombus. Normal compressibility. Venous Reflux:  None. Other Findings: No evidence of superficial thrombophlebitis or abnormal fluid collection. IMPRESSION: Positive for deep vein thrombosis in the right lower extremity with occlusive thrombus in the right femoral, popliteal and calf veins. The popliteal vein thrombus is echogenic, implicating some degree of chronicity. Electronically Signed   By: Marcey Moan M.D.   On: 08/10/2024 11:17     Procedures   Medications Ordered in the ED  apixaban (ELIQUIS) tablet 10 mg (has no administration in time range)                                    Medical Decision Making Risk Prescription drug management.   This patient complains of DVT right lower extremity, unable to afford medication;  this involves an extensive number of treatment Options and is a complaint that carries with it a high risk of complications and morbidity. The differential includes DVT, financial hardship, PE  I ordered medication oral Eliquis and reviewed PMP when indicated. I independently    visualized and interpreted imaging which showed acute DVT right lower extremity Previous records obtained and reviewed in epic including ultrasound notes and PCP visit  Cardiac monitoring reviewed, sinus rhythm Social determinants considered, no significant barriers Critical Interventions: None  After the interventions stated above, I reevaluated the patient and found patient to be with stable vitals satting well on room air in no distress Admission and further testing considered, no indications for admission.  He has a voucher for the Eliquis and he is comfortable starting that.  Will give him a dose here.  Also put in a referral for DVT clinic if he has troubles getting the medication and he will also need them for follow-up.  Return instructions discussed.      Final diagnoses:  Acute deep vein thrombosis (DVT) of femoral vein of right lower extremity  [facility])    ED Discharge Orders          Ordered    AMB Referral to Deep Vein Thrombosis Clinic        08/10/24 2214    apixaban (ELIQUIS) 5 MG TABS tablet        08/10/24 2216               Towana Ozell BROCKS, MD 08/11/24 [phone]

## 2024-08-10 NOTE — ED Triage Notes (Signed)
 Pt reports he was here for an outpatient CT this am, + for DVT; pt reports medication was over $700 that he cannot afford, was told by PCP he could come to ED for IV thinners

## 2024-08-10 NOTE — Discharge Instructions (Addendum)
 Please start the Eliquis.  Follow-up with your primary care doctor or the DVT clinic.  Return if any problems starting the medicine or any other concerns.

## 2024-08-11 ENCOUNTER — Telehealth: Payer: Self-pay | Admitting: Pharmacist

## 2024-08-11 ENCOUNTER — Other Ambulatory Visit (HOSPITAL_COMMUNITY): Payer: Self-pay

## 2024-08-11 NOTE — Progress Notes (Addendum)
 DVT Clinic Note  Name: Matthew Payne     MRN: [id number]     DOB: 1946/01/02     Sex: male  PCP: [name], MD  Today's Visit: Visit Information: Initial Visit  Referred to DVT Clinic by: Emergency Department - Dr. Towana Referred to CPP by: Dr. Serene Reason for referral:  Chief Complaint  Patient presents with   Med Management - DVT   HISTORY OF PRESENT ILLNESS: NYGEL PROKOP is a 78 y.o. male with PMH HTN, HLD, prostate cancer managed by watchful waiting, who presents for follow up medication management. Patient was diagnosed with RLE DVT on outpatient imaging 08/10/24 and his PCP prescribed Eliquis but he was unable to afford this at the pharmacy, so his PCP sent him to the ED, who gave him a free card and referred him to DVT Clinic for follow up. He still had issues at the pharmacy and spoke with DVT Clinic pharmacist Katie on 08/11/24 who contacted the pharmacy on his behalf, provided them with the free card information, and confirmed the patient could pick it up that day. Today, patient reports that he first began having swelling in both of his legs last summer and his PCP prescribed furosemide. This helped resolve the swelling in the left leg. Swelling in the right leg has persisted. It improves overnight and then worsens throughout the day. Has been about the same since last year, no recent worsening. He saw his PCP for this recently since it wasn't getting better. Denies pain. 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 Usually short trips but sometimes drives to [place] and back in the same day. Had hernia repair surgery in March of this year but reports he was still active after this. Didn't notice any worsening in swelling afterward. Denies abnormal bleeding or bruising. Denies missed doses of Eliquis. Has compression stockings from a medical supply store but has not been wearing them.   Positive Thrombotic Risk Factors: Older Age Bleeding Risk  Factors: Age >65 years, Anticoagulant therapy  Negative Thrombotic Risk Factors: Previous VTE, Recent trauma (within 3 months), Recent admission to hospital with acute illness (within 3 months), Paralysis, paresis, or recent plaster cast immobilization of lower extremity, Central venous catheterization, Bed rest >72 hours within 3 months, Pregnancy, Within 6 weeks postpartum, Recent cesarean section (within 3 months), Estrogen therapy, Testosterone therapy, Erythropoiesis-stimulating agent, Recent COVID diagnosis (within 3 months), Active cancer, Non-malignant, chronic inflammatory condition, Known thrombophilic condition, Smoking, Obesity  Rx Insurance Coverage: Medicare Rx Affordability: Eliquis is $440 for a 30 day supply due to his remaining deductible. Used free card to fill starter pack.  Rx Assistance Provided: Free 30-day trial card [facility]erred [facility]acy[facility] due to [facility] coupon being the least expensive there.  Past Medical History:  Diagnosis Date   DVT (deep venous thrombosis) (HCC)    Hypercholesteremia    Hypertension     Past Surgical History:  Procedure Laterality Date   CATARACT EXTRACTION, BILATERAL  5 yrs ago   COLONOSCOPY  05/28/2011   Procedure: COLONOSCOPY;  Surgeon: Claudis RAYMOND Rivet, MD;  [facility]: [facility];  Service: Endoscopy;  Laterality: N/A;   COLONOSCOPY N/A 07/31/2021   Procedure: COLONOSCOPY;  Surgeon: Rivet Claudis RAYMOND, MD;  Location: [facility];  Service: Endoscopy;  Laterality: N/A;  13:00   CYSTECTOMY     back & leg   INGUINAL HERNIA REPAIR  07/01/2012   Procedure: HERNIA REPAIR INGUINAL ADULT;  Surgeon:  Oneil DELENA Budge, MD;  Location: [facility];  Service: General;  Laterality: Right;  Right Inguinal Herniorraphy with Mesh   IR 3D INDEPENDENT WKST  10/26/2023   IR ANGIOGRAM PELVIS SELECTIVE OR SUPRASELECTIVE  10/26/2023   IR ANGIOGRAM SELECTIVE EACH ADDITIONAL VESSEL  10/26/2023   IR ANGIOGRAM SELECTIVE EACH ADDITIONAL VESSEL  10/26/2023    IR EMBO TUMOR ORGAN ISCHEMIA INFARCT INC GUIDE ROADMAPPING  10/26/2023   IR RADIOLOGIST EVAL & MGMT  09/08/2023   IR RADIOLOGIST EVAL & MGMT  12/08/2023   IR US  GUIDE VASC ACCESS LEFT  10/26/2023   IR US  GUIDE VASC ACCESS LEFT  10/26/2023   IR US  GUIDE VASC ACCESS LEFT  10/26/2023   Nesbit  Plication     POLYPECTOMY  07/31/2021   Procedure: POLYPECTOMY;  Surgeon: Golda Claudis PENNER, MD;  Location: [facility];  Service: Endoscopy;;   ROBOTIC ASSISTED LAPAROSCOPIC LYSIS OF ADHESION N/A 01/06/2024   Procedure: LYSIS, ADHESIONS, ROBOT-ASSISTED, LAPAROSCOPIC;  Surgeon: Evonnie Dorothyann DELENA, DO;  Location: [facility];  Service: General;  Laterality: N/A;   TONSILLECTOMY     XI ROBOTIC ASSISTED INGUINAL HERNIA REPAIR WITH MESH Left 01/06/2024   Procedure: REPAIR, HERNIA, INGUINAL, ROBOT-ASSISTED, LAPAROSCOPIC, USING MESH;  Surgeon: Evonnie Dorothyann DELENA, DO;  Location: [facility];  Service: General;  Laterality: Left;    Social History   Socioeconomic History   Marital status: Married    Spouse name: Not on file   Number of children: Not on file   Years of education: Not on file   Highest education level: Not on file  Occupational History   Not on file  Tobacco Use   Smoking status: Former    Current packs/day: 0.00    Types: Cigarettes    Quit date: 02/24/1978    Years since quitting: 46.5   Smokeless tobacco: Never  Vaping Use   Vaping status: Never Used  Substance and Sexual Activity   Alcohol use: Not Currently   Drug use: No   Sexual activity: Yes  Other Topics Concern   Not on file  Social History Narrative   Not on file   Social Drivers of Health   Financial Resource Strain: Not on file  Food Insecurity: Not on file  Transportation Needs: Not on file  Physical Activity: Not on file  Stress: Not on file  Social Connections: Unknown (03/03/2022)   Received [facility]   Social Network    Social Network: Not on file  Intimate Partner Violence: Unknown (01/23/2022)   Received  from [facility]   HITS    Physically Hurt: Not on file    Insult or Talk Down To: Not on file    Threaten Physical Harm: Not on file    Scream or Curse: Not on file    History reviewed. No pertinent family history.  Allergies as of 08/14/2024   (No Known Allergies)    Current Outpatient Medications on File Prior to Visit  Medication Sig Dispense Refill   apixaban (ELIQUIS) 5 MG TABS tablet Take 2 tablets (10mg ) twice daily for 7 days, then 1 tablet (5mg ) twice daily 60 tablet 0   Bioflavonoid Products (ESTER C PO) Take 1,000 mg by mouth daily.     enalapril (VASOTEC) 10 MG tablet Take 10 mg by mouth daily.     furosemide (LASIX) 40 MG tablet Take 40 mg by mouth daily as needed.     pravastatin  (PRAVACHOL ) 40 MG tablet Take 40 mg by mouth daily.  tamsulosin  (FLOMAX ) 0.4 MG CAPS capsule Take 2 capsules (0.8 mg total) by mouth daily. 180 capsule 3   zolpidem (AMBIEN) 10 MG tablet Take 10 mg by mouth at bedtime.     tadalafil  (CIALIS ) 5 MG tablet Take up to 4 tablets po as needed daily or up to 2 tablets daily po with 2-3 20mg  sildenafil  as needed for sexual activity.  Do not take within 4 hours of tamsulosin /flomax . 30 tablet 11   No current facility-administered medications on file prior to visit.   REVIEW OF SYSTEMS:  Review of Systems  Respiratory:  Negative for shortness of breath.   Cardiovascular:  Positive for leg swelling. Negative for chest pain and palpitations.  Musculoskeletal:  Negative for myalgias.   PHYSICAL EXAMINATION:  Vitals:   08/14/24 0929  BP: 134/70  Pulse: 66  SpO2: 98%  Weight: 183 lb 3.2 oz (83.1 kg)    Body mass index is 24.85 kg/m.  Physical Exam Vitals reviewed.  Cardiovascular:     Rate and Rhythm: Normal rate.  Pulmonary:     Effort: Pulmonary effort is normal.  Musculoskeletal:        General: No tenderness.     Right lower leg: Edema (2+) present.     Left lower leg: Edema (trace) present.  Skin:    Findings: No bruising or  erythema.  Psychiatric:        Mood and Affect: Mood normal.        Behavior: Behavior normal.        Thought Content: Thought content normal.   Villalta Score for Post-Thrombotic Syndrome: Pain: Absent Cramps: Absent Heaviness: Absent Paresthesia: Absent Pruritus: Absent Pretibial Edema: Moderate Skin Induration: Absent Hyperpigmentation: Absent Redness: Absent Venous Ectasia: Absent Pain on calf compression: Absent Villalta Preliminary Score: 2 Is venous ulcer present?: No If venous ulcer is present and score is <15, then 15 points total are assigned: Absent Villalta Total Score: 2  LABS:  CBC     Component Value Date/Time   WBC 9.0 12/05/2023 1134   RBC 4.80 12/05/2023 1134   HGB 14.6 12/05/2023 1134   HCT 44.3 12/05/2023 1134   PLT 166 12/05/2023 1134   MCV 92.3 12/05/2023 1134   MCH 30.4 12/05/2023 1134   MCHC 33.0 12/05/2023 1134   RDW 13.0 12/05/2023 1134   LYMPHSABS 2.1 10/26/2023 1000   MONOABS 0.6 10/26/2023 1000   EOSABS 0.1 10/26/2023 1000   BASOSABS 0.0 10/26/2023 1000    Hepatic Function      Component Value Date/Time   PROT 7.1 12/05/2023 1134   ALBUMIN 4.0 12/05/2023 1134   AST 17 12/05/2023 1134   ALT 18 12/05/2023 1134   ALKPHOS 65 12/05/2023 1134   BILITOT 0.8 12/05/2023 1134    Renal Function   Lab Results  Component Value Date   CREATININE 0.82 12/05/2023   CREATININE 0.72 10/26/2023   CREATININE 0.84 10/13/2022    CrCl cannot be calculated (Patient's most recent lab result is older than the maximum 21 days allowed.).   [facility] Vascular Lab Studies:  08/10/24 doppler FINDINGS: Contralateral Common Femoral Vein: Respiratory phasicity is normal and symmetric with the symptomatic side. No evidence of thrombus. Normal compressibility.   Common Femoral Vein: No evidence of thrombus. Normal compressibility, respiratory phasicity and response to augmentation.   Saphenofemoral Junction: No evidence of thrombus. Normal compressibility and  flow on color Doppler imaging.   Profunda Femoral Vein: No evidence of thrombus. Normal compressibility and flow on color Doppler imaging.  Femoral Vein: Occlusive thrombus identified.   Popliteal Vein: Occlusive thrombus identified which is echogenic, implicating some degree of chronicity.   Calf Veins: Visualized segments of peroneal and posterior tibial veins demonstrate thrombus.   Superficial Great Saphenous Vein: No evidence of thrombus. Normal compressibility.   Venous Reflux:  None.   Other Findings: No evidence of superficial thrombophlebitis or abnormal fluid collection.  IMPRESSION: Positive for deep vein thrombosis in the right lower extremity with occlusive thrombus in the right femoral, popliteal and calf veins. The popliteal vein thrombus is echogenic, implicating some degree of chronicity.  ASSESSMENT: Patient without prior history of DVT diagnosed with DVT extending from the right femoral through the calf veins. Popliteal thrombus appeared to be more chronic. Given that his symptoms started a year and a half ago, it is difficult to assess provoking risk factors. He has had some long drives and a surgery within the past year but says that his symptoms have been unchanged since before then. Will refer to hematology for further workup determining cause of DVT and duration of treatment needed.   He was appropriately started on Eliquis in the ED but had some difficulty filling this due to cost. We helped him obtain the free card for the initial fill. He has a $440 deductible on his insurance, and at this time we aren't able to tell what his copay will be after that. Being that it is already October, his deductible will reset in just a couple months in January and he would have to pay $440 again. In discussion with the patient, this would be difficult for him to afford so close together. He will not quality for Eliquis patient assistance, given that he would have to pay 3%  of his annual income on out of pocket costs at the pharmacy first, which he won't meet before the end of the year. Not eligible for other copay cards now that he has used the once per lifetime free card. Discussed alternative options for anticoagulation. Using shared decision making, we have decided to have him finish out his current supply of Eliquis, then [facility] through the end of the year. He will check with his insurance in the meantime what his Eliquis copay would be in the new year after his deductible is met and meet with hematology to determine duration of anticoagulation needed. He and I will touch base again in January to discuss this and revisit the medication access discussion. He could stay on dabigatran if he is tolerating that well or switch back to Eliquis if desired and affordable in the new year. Counseled patient extensively on both Eliquis and dabigatran, and all his questions have been answered.   PLAN: -Continue Eliquis to complete current 30 day supply. Then, switch to dabigatran 150 mg BID. Will re-evaluate cost and possibility of switching back to Eliquis in new year.  -Expected duration of therapy: per hematology. Therapy started on 08/11/24. -Patient educated on purpose, proper use and potential adverse effects of apixaban (Eliquis). -Discussed importance of taking medication around the same time every day. -Advised patient of medications to avoid (NSAIDs, aspirin doses >100 mg daily). -Educated that Tylenol  (acetaminophen ) is the preferred analgesic to lower the risk of bleeding. -Advised patient to alert all providers of anticoagulation therapy prior to starting a new medication or having a procedure. -Emphasized importance of monitoring for signs and symptoms of bleeding (abnormal bruising, prolonged bleeding, nose bleeds, bleeding from gums, discolored urine,  black tarry stools). -Educated patient to present to the ED if  emergent signs and symptoms of new thrombosis occur. -Counseled patient to wear compression stockings daily, removing at night. Elevate legs to help with swelling.   Follow up: Referred to hematology. DVT Clinic in January to re-evaluate medication access.   Lum Herald, PharmD, [place], CPP [facility] [phone]  I have evaluated the patient's chart/imaging and refer this patient to the Clinical Pharmacist Practitioner for medication management. I have reviewed the CPP's documentation and agree with her assessment and plan. I was immediately available during the visit for questions and collaboration.   Malvina New, MD

## 2024-08-11 NOTE — Telephone Encounter (Signed)
 Patient was diagnosed with a DVT yesterday and his PCP prescribed Eliquis. Received a message that he was unable to pick up Eliquis at [facility] as it was going to cost $700. Called [facility] in [place] and provided billing information for the manufacturer 1 month free card so he is able to pick up the medication today. They confirmed this was processing correctly and that the prescription was for the appropriate VTE dosing. Called patient to let him know he should be able to pick up the Eliquis now without any issues. He said that the pharmacy told him he did not have a part D plan, but that he called [employer] and they confirmed his part D plan was active. [facility] test claims shows first fill of Eliquis will be ~$440 for a 30 day supply with his insurance due to his remaining deductible. Will discuss further at his appointment on Monday to ensure ongoing access to anticoagulation. Counseled patient to pick up the medication today and start taking it as soon as possible. Counseled him to call us  back if he has any other issues getting Eliquis from [facility] and he confirmed understanding.

## 2024-08-14 ENCOUNTER — Ambulatory Visit: Attending: Surgery | Admitting: Student-PharmD

## 2024-08-14 ENCOUNTER — Encounter: Payer: Self-pay | Admitting: Student-PharmD

## 2024-08-14 VITALS — BP 134/70 | HR 66 | Wt 183.2 lb

## 2024-08-14 DIAGNOSIS — I82411 Acute embolism and thrombosis of right femoral vein: Secondary | ICD-10-CM | POA: Diagnosis not present

## 2024-08-14 MED ORDER — DABIGATRAN ETEXILATE MESYLATE 150 MG PO CAPS: 150.0000 mg | ORAL_CAPSULE | Freq: Two times a day (BID) | ORAL | 1 refills | Status: DC

## 2024-08-14 NOTE — Patient Instructions (Addendum)
-  Continue taking Eliquis. When you've run out of Eliquis, pick up [facility] and bring the GoodRx coupon I gave you. Call me if there are any issues.  -We want you to see a hematologist to help determine how long you need to be on the blood thinner.  -I'll see you back in January so we can discuss cost of the blood thinner in the new year. Before then, call your insurance to see how much Eliquis will cost you in the new year after your deductible is met.  -It is important to take your medication around the same time every day.  -Avoid NSAIDs like ibuprofen  (Advil , Motrin ) and naproxen  (Aleve ) as well as aspirin doses over 100 mg daily. -Tylenol  (acetaminophen ) is the preferred over the counter pain medication to lower the risk of bleeding. -Be sure to alert all of your health care providers that you are taking an anticoagulant prior to starting a new medication or having a procedure. -Monitor for signs and symptoms of bleeding (abnormal bruising, prolonged bleeding, nose bleeds, bleeding from gums, discolored urine, black tarry stools). If you have fallen and hit your head OR if your bleeding is severe or not stopping, seek emergency care.  -Go to the emergency room if emergent signs and symptoms of new clot occur (new or worse swelling and pain in an arm or leg, shortness of breath, chest pain, fast or irregular heartbeats, lightheadedness, dizziness, fainting, coughing up blood) or if you experience a significant color change (pale or blue) in the extremity that has the DVT.  -We recommend you wear compression stockings (20-30 mmHg) as long as you are having swelling or pain. Be sure to purchase the correct size and take them off at night.   If you have any questions or need to reschedule an appointment, please call [phone]. If you are having an emergency, call 911 or present to the nearest emergency room.   What is a DVT?  -Deep vein thrombosis (DVT) is a condition in which  a blood clot forms in a vein of the deep venous system which can occur in the lower leg, thigh, pelvis, arm, or neck. This condition is serious and can be life-threatening if the clot travels to the arteries of the lungs and causing a blockage (pulmonary embolism, PE). A DVT can also damage veins in the leg, which can lead to long-term venous disease, leg pain, swelling, discoloration, and ulcers or sores (post-thrombotic syndrome).  -Treatment may include taking an anticoagulant medication to prevent more clots from forming and the current clot from growing, wearing compression stockings, and/or surgical procedures to remove or dissolve the clot.

## 2024-09-01 ENCOUNTER — Inpatient Hospital Stay

## 2024-09-01 ENCOUNTER — Encounter: Payer: Self-pay | Admitting: Hematology

## 2024-09-01 ENCOUNTER — Inpatient Hospital Stay: Attending: Hematology | Admitting: Hematology

## 2024-09-01 VITALS — BP 105/62 | HR 79 | Temp 97.9°F | Resp 20 | Ht 72.0 in | Wt 180.8 lb

## 2024-09-01 DIAGNOSIS — I82511 Chronic embolism and thrombosis of right femoral vein: Secondary | ICD-10-CM

## 2024-09-01 DIAGNOSIS — N400 Enlarged prostate without lower urinary tract symptoms: Secondary | ICD-10-CM | POA: Diagnosis not present

## 2024-09-01 DIAGNOSIS — R918 Other nonspecific abnormal finding of lung field: Secondary | ICD-10-CM | POA: Diagnosis not present

## 2024-09-01 DIAGNOSIS — Z7901 Long term (current) use of anticoagulants: Secondary | ICD-10-CM | POA: Diagnosis not present

## 2024-09-01 DIAGNOSIS — E7800 Pure hypercholesterolemia, unspecified: Secondary | ICD-10-CM | POA: Diagnosis not present

## 2024-09-01 DIAGNOSIS — R911 Solitary pulmonary nodule: Secondary | ICD-10-CM | POA: Diagnosis not present

## 2024-09-01 DIAGNOSIS — I10 Essential (primary) hypertension: Secondary | ICD-10-CM | POA: Diagnosis not present

## 2024-09-01 DIAGNOSIS — Z87891 Personal history of nicotine dependence: Secondary | ICD-10-CM | POA: Diagnosis not present

## 2024-09-01 DIAGNOSIS — Z86718 Personal history of other venous thrombosis and embolism: Secondary | ICD-10-CM | POA: Diagnosis not present

## 2024-09-01 DIAGNOSIS — F17210 Nicotine dependence, cigarettes, uncomplicated: Secondary | ICD-10-CM | POA: Diagnosis not present

## 2024-09-01 NOTE — Progress Notes (Addendum)
 [facility]   Telephone:([phone] Fax:(336) [phone]   Clinic New Consult Note   Patient Care Team: [name], MD as PCP - General (Family Medicine) 09/01/2024  CHIEF COMPLAINTS/PURPOSE OF CONSULTATION:  DVT  REFERRING PHYSICIAN: DVT clinic   Discussed the use of AI scribe software for clinical note transcription with the patient, who gave verbal consent to proceed.  History of Present Illness Matthew Payne is a 78 year old male who presents for a new consult regarding DVT management.  He has experienced swelling in both legs since June 2025, with persistent swelling in the right leg that decreases at night but returns during the day. A Doppler ultrasound in October 2025 evaluated the swelling. He currently takes Eliquis , initially four tablets daily, now reduced to two, but seeks alternatives due to cost. He uses compression socks but lost one and cannot replace it due to expense. He elevates his legs to manage swelling. He denies shortness of breath or chest pain and occasionally feels a 'shooting vein' sensation in the swollen leg.  He has a history of elevated PSA and underwent a procedure for an enlarged prostate. A biopsy revealed a speck of cancer, and he is monitored with PSA tests every six months. His family history includes pancreatic and liver cancer in his father, a heavy smoker and drinker.  He quit smoking in 1979 after over 20 years of smoking a pack a day. He does not consume alcohol. 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     MEDICAL HISTORY:  Past Medical History:  Diagnosis Date   DVT (deep venous thrombosis) (HCC)    Hypercholesteremia    Hypertension     SURGICAL HISTORY: Past Surgical History:  Procedure Laterality Date   CATARACT EXTRACTION, BILATERAL  5 yrs ago   COLONOSCOPY  05/28/2011   Procedure: COLONOSCOPY;  Surgeon: Claudis RAYMOND Rivet, MD;  [facility]: [facility];   Service: Endoscopy;  Laterality: N/A;   COLONOSCOPY N/A 07/31/2021   Procedure: COLONOSCOPY;  Surgeon: Rivet Claudis RAYMOND, MD;  Location: [facility];  Service: Endoscopy;  Laterality: N/A;  13:00   CYSTECTOMY     back & leg   INGUINAL HERNIA REPAIR  07/01/2012   Procedure: HERNIA REPAIR INGUINAL ADULT;  Surgeon: Oneil DELENA Budge, MD;  Location: [facility];  Service: General;  Laterality: Right;  Right Inguinal Herniorraphy with Mesh   IR 3D INDEPENDENT WKST  10/26/2023   IR ANGIOGRAM PELVIS SELECTIVE OR SUPRASELECTIVE  10/26/2023   IR ANGIOGRAM SELECTIVE EACH ADDITIONAL VESSEL  10/26/2023   IR ANGIOGRAM SELECTIVE EACH ADDITIONAL VESSEL  10/26/2023   IR EMBO TUMOR ORGAN ISCHEMIA INFARCT INC GUIDE ROADMAPPING  10/26/2023   IR RADIOLOGIST EVAL & MGMT  09/08/2023   IR RADIOLOGIST EVAL & MGMT  12/08/2023   IR US  GUIDE VASC ACCESS LEFT  10/26/2023   IR US  GUIDE VASC ACCESS LEFT  10/26/2023   IR US  GUIDE VASC ACCESS LEFT  10/26/2023   Nesbit  Plication     POLYPECTOMY  07/31/2021   Procedure: POLYPECTOMY;  Surgeon: Rivet Claudis RAYMOND, MD;  [facility]: [facility];  Service: Endoscopy;;   ROBOTIC ASSISTED LAPAROSCOPIC LYSIS OF ADHESION N/A 01/06/2024   Procedure: LYSIS, ADHESIONS, ROBOT-ASSISTED, LAPAROSCOPIC;  Surgeon: Evonnie Dorothyann DELENA, DO;  Location: [facility];  Service: General;  Laterality: N/A;   TONSILLECTOMY     XI ROBOTIC ASSISTED INGUINAL HERNIA REPAIR WITH MESH Left 01/06/2024  Procedure: REPAIR, HERNIA, INGUINAL, ROBOT-ASSISTED, LAPAROSCOPIC, USING MESH;  Surgeon: Evonnie Dorothyann LABOR, DO;  Location: [facility];  Service: General;  Laterality: Left;    SOCIAL HISTORY: Social History   Socioeconomic History   Marital status: Married    Spouse name: Not on file   Number of children: 3   Years of education: Not on file   Highest education level: Not on file  Occupational History   Not on file  Tobacco Use   Smoking status: Former    Current packs/day: 0.00    Types: Cigarettes    Quit date:  02/24/1978    Years since quitting: 46.5   Smokeless tobacco: Never  Vaping Use   Vaping status: Never Used  Substance and Sexual Activity   Alcohol use: Not Currently   Drug use: No   Sexual activity: Yes  Other Topics Concern   Not on file  Social History Narrative   Not on file   Social Drivers of Health   Financial Resource Strain: Not on file  Food Insecurity: Not on file  Transportation Needs: Not on file  Physical Activity: Not on file  Stress: Not on file  Social Connections: Unknown (03/03/2022)   Received from [facility]   Social Network    Social Network: Not on file  Intimate Partner Violence: Unknown (01/23/2022)   Received from [facility]   HITS    Physically Hurt: Not on file    Insult or Talk Down To: Not on file    Threaten Physical Harm: Not on file    Scream or Curse: Not on file    FAMILY HISTORY: Family History  Problem Relation Age of Onset   Cancer Father        pancreatic cancer    ALLERGIES:  has no known allergies.  MEDICATIONS:  Current Outpatient Medications  Medication Sig Dispense Refill   apixaban  (ELIQUIS ) 5 MG TABS tablet Take 2 tablets (10mg ) twice daily for 7 days, then 1 tablet (5mg ) twice daily 60 tablet 0   Bioflavonoid Products (ESTER C PO) Take 1,000 mg by mouth daily.     dabigatran  (PRADAXA ) 150 MG CAPS capsule Take 1 capsule (150 mg total) by mouth 2 (two) times daily. (Patient taking differently: Take 150 mg by mouth 2 (two) times daily. Can't afford Eliquis  refills. S[facility]ran  to [facility] with GoodRx coupon which he will start taking AFTER he runs out of Eliquis  starter pack.) 60 capsule 1   enalapril (VASOTEC) 10 MG tablet Take 10 mg by mouth daily.     pravastatin  (PRAVACHOL ) 40 MG tablet Take 40 mg by mouth daily.     tadalafil  (CIALIS ) 5 MG tablet Take up to 4 tablets po as needed daily or up to 2 tablets daily po with 2-3 20mg  sildenafil  as needed for sexual activity.  Do not take within 4 hours of  tamsulosin /flomax . 30 tablet 11   tamsulosin  (FLOMAX ) 0.4 MG CAPS capsule Take 2 capsules (0.8 mg total) by mouth daily. 180 capsule 3   zolpidem (AMBIEN) 10 MG tablet Take 10 mg by mouth at bedtime.     furosemide (LASIX) 40 MG tablet Take 40 mg by mouth daily as needed. (Patient not taking: Reported on 09/01/2024)     No current facility-administered medications for this visit.    REVIEW OF SYSTEMS:   Constitutional: Denies fevers, chills or abnormal night sweats Eyes: Denies blurriness of vision, double vision or watery eyes Ears, nose, mouth, throat, and face: Denies mucositis or  sore throat Respiratory: Denies cough, dyspnea or wheezes Cardiovascular: Denies palpitation, chest discomfort or lower extremity swelling Gastrointestinal:  Denies nausea, heartburn or change in bowel habits Skin: Denies abnormal skin rashes Lymphatics: Denies new lymphadenopathy or easy bruising Neurological:Denies numbness, tingling or new weaknesses Behavioral/Psych: Mood is stable, no new changes  All other systems were reviewed with the patient and are negative.  PHYSICAL EXAMINATION: ECOG PERFORMANCE STATUS: 0 - Asymptomatic  Vitals:   09/01/24 1038  BP: 105/62  Pulse: 79  Resp: 20  Temp: 97.9 F (36.6 C)  SpO2: 96%   Filed Weights   09/01/24 1038  Weight: 180 lb 12.4 oz (82 kg)    GENERAL:alert, no distress and comfortable SKIN: skin color, texture, turgor are normal, no rashes or significant lesions EYES: normal, conjunctiva are pink and non-injected, sclera clear OROPHARYNX:no exudate, no erythema and lips, buccal mucosa, and tongue normal  NECK: supple, thyroid  normal size, non-tender, without nodularity LYMPH:  no palpable lymphadenopathy in the cervical, axillary or inguinal LUNGS: clear to auscultation and percussion with normal breathing effort HEART: regular rate & rhythm and no murmurs and no lower extremity edema ABDOMEN:abdomen soft, non-tender and normal bowel  sounds Musculoskeletal:no cyanosis of digits and no clubbing  PSYCH: alert & oriented x 3 with fluent speech NEURO: no focal motor/sensory deficits  Physical Exam ABDOMEN: Abdomen non-tender, non-tender with deep palpation, no tenderness on left side. EXTREMITIES: Right leg swollen.  LABORATORY DATA:  I have reviewed the data as listed    Latest Ref Rng & Units 12/05/2023   11:34 AM 10/26/2023   10:00 AM 10/13/2022    8:09 PM  CBC  WBC 4.0 - 10.5 K/uL 9.0  6.2  7.0   Hemoglobin 13.0 - 17.0 g/dL 85.3  85.2  84.8   Hematocrit 39.0 - 52.0 % 44.3  44.0  46.6   Platelets 150 - 400 K/uL 166  168  159       Latest Ref Rng & Units 12/05/2023   11:34 AM 10/26/2023   10:00 AM 10/13/2022    8:09 PM  CMP  Glucose 70 - 99 mg/dL 797  866  896   BUN 8 - 23 mg/dL 18  19  22    Creatinine 0.61 - 1.24 mg/dL 9.17  9.27  9.15   Sodium 135 - 145 mmol/L 140  138  139   Potassium 3.5 - 5.1 mmol/L 4.3  4.0  4.2   Chloride 98 - 111 mmol/L 105  107  104   CO2 22 - 32 mmol/L 26  24  30    Calcium 8.9 - 10.3 mg/dL 89.4  9.9  89.9   Total Protein 6.5 - 8.1 g/dL 7.1     Total Bilirubin 0.0 - 1.2 mg/dL 0.8     Alkaline Phos 38 - 126 U/L 65     AST 15 - 41 U/L 17     ALT 0 - 44 U/L 18        RADIOGRAPHIC STUDIES: I have personally reviewed the radiological images as listed and agreed with the findings in the report. US  Venous Img Lower Unilateral Right (DVT) Result Date: 08/10/2024 CLINICAL DATA:  Right lower extremity pain and edema. EXAM: RIGHT LOWER EXTREMITY VENOUS DOPPLER ULTRASOUND TECHNIQUE: Gray-scale sonography with graded compression, as well as color Doppler and duplex ultrasound were performed to evaluate the lower extremity deep venous systems from the level of the common femoral vein and including the common femoral, femoral, profunda femoral, popliteal and calf veins  including the posterior tibial, peroneal and gastrocnemius veins when visible. The superficial great saphenous vein was also  interrogated. Spectral Doppler was utilized to evaluate flow at rest and with distal augmentation maneuvers in the common femoral, femoral and popliteal veins. COMPARISON:  None Available. FINDINGS: Contralateral Common Femoral Vein: Respiratory phasicity is normal and symmetric with the symptomatic side. No evidence of thrombus. Normal compressibility. Common Femoral Vein: No evidence of thrombus. Normal compressibility, respiratory phasicity and response to augmentation. Saphenofemoral Junction: No evidence of thrombus. Normal compressibility and flow on color Doppler imaging. Profunda Femoral Vein: No evidence of thrombus. Normal compressibility and flow on color Doppler imaging. Femoral Vein: Occlusive thrombus identified. Popliteal Vein: Occlusive thrombus identified which is echogenic, implicating some degree of chronicity. Calf Veins: Visualized segments of peroneal and posterior tibial veins demonstrate thrombus. Superficial Great Saphenous Vein: No evidence of thrombus. Normal compressibility. Venous Reflux:  None. Other Findings: No evidence of superficial thrombophlebitis or abnormal fluid collection. IMPRESSION: Positive for deep vein thrombosis in the right lower extremity with occlusive thrombus in the right femoral, popliteal and calf veins. The popliteal vein thrombus is echogenic, implicating some degree of chronicity. Electronically Signed   By: Marcey Moan M.D.   On: 08/10/2024 11:17     Assessment & Plan Chronic right lower extremity deep vein thrombosis, unprovoked Chronic DVT in the right lower extremity with occlusive thrombus in the right femoral, popliteal, and calf veins. Symptoms include persistent swelling and occasional shooting pain.  The condition has been present for approximately 3-4 months, with swelling noted since last June. No prior history of blood clots. Long-distance driving may have contributed to the condition. Chronic nature of the clots may limit the  effectiveness of anticoagulation therapy. Discussed the potential for scar tissue formation and the need for ongoing anticoagulation therapy. Risks of bleeding with anticoagulation therapy were discussed, including the need to avoid falls and cuts. Alternative anticoagulation options, including dabigatran  and warfarin, were discussed, with considerations for cost and monitoring requirements. - Continue anticoagulation therapy for at least 6 months. - Ordered repeat Doppler ultrasound in 5 months to assess resolution of clots. - Advised wearing compression stockings and elevating legs to reduce swelling. - Recommended staying active during long trips, with regular stops to walk and wear compression stockings. - Discussed alternative anticoagulation options, including dabigatran  and warfarin, with considerations for cost and monitoring requirements. - Ordered CT chest for lung cancer screening.  He had a CT abdomen pelvis in Feb 2025 which was negative except a kidney stone.   Plan - I recommend at least 6 months of anticoagulation, managed by DVT clinic. - Will repeat Doppler ultrasound of the right lower extremity in 5 months.  If he has chronic DVT, I recommend continue anticoagulation indefinitely. - Phone visit after his next Doppler. - CT chest for lung cancer screening in the next months.  Will call him with results.    Orders Placed This Encounter  Procedures   CT Chest Wo Contrast    Standing Status:   Future    Expected Date:   09/15/2024    Expiration Date:   09/01/2025    Preferred imaging location?:   [facility]   US  Venous Img Lower Unilateral Right (DVT)    Standing Status:   Future    Expected Date:   01/30/2025    Expiration Date:   09/01/2025    Reason for Exam (SYMPTOM  OR DIAGNOSIS REQUIRED):   edema    Preferred imaging location?:  [facility]    All questions were answered. The patient knows to call the clinic with any problems, questions or  concerns. I spent 25 minutes counseling the patient face to face. The total time spent in the appointment was 30 minutes including review of chart and various tests results, discussions about plan of care and coordination of care plan.     Onita Mattock, MD 09/01/2024 12:21 PM   Addendum I called the patient, and discussed the CT scan findings with him.  He has bilateral multiple small lung nodules, largest 6 mm, no high concern for lung cancer, or monitor.  Plan to repeat a CT chest without contrast in April 2026, he has appointment with NP Delon on May 1.  He voiced a good understanding and agrees with the plan.  Onita Mattock  09/12/2024

## 2024-09-04 ENCOUNTER — Telehealth: Payer: Self-pay | Admitting: Pharmacist

## 2024-09-04 NOTE — Telephone Encounter (Signed)
 Patient called stating that he lost the paperwork [facility] pharmacist, [facility], gave him during his last appointment that had GoodRx coupon information for dabigatran on it. Counseled patient on how to search for GoodRx coupon himself and instructed him to show that information to [facility] for billing. All patient's questions were answered and he confirmed understanding.

## 2024-09-05 ENCOUNTER — Ambulatory Visit (HOSPITAL_COMMUNITY)
Admission: RE | Admit: 2024-09-05 | Discharge: 2024-09-05 | Disposition: A | Discharge status: Home / Self Care | Source: Ambulatory Visit | Attending: Hematology | Admitting: Hematology

## 2024-09-05 ENCOUNTER — Other Ambulatory Visit

## 2024-09-05 DIAGNOSIS — I700 Atherosclerosis of aorta: Secondary | ICD-10-CM | POA: Insufficient documentation

## 2024-09-05 DIAGNOSIS — Z122 Encounter for screening for malignant neoplasm of respiratory organs: Secondary | ICD-10-CM | POA: Diagnosis present

## 2024-09-05 DIAGNOSIS — R918 Other nonspecific abnormal finding of lung field: Secondary | ICD-10-CM | POA: Diagnosis not present

## 2024-09-05 DIAGNOSIS — I2510 Atherosclerotic heart disease of native coronary artery without angina pectoris: Secondary | ICD-10-CM | POA: Diagnosis not present

## 2024-09-05 DIAGNOSIS — R972 Elevated prostate specific antigen [PSA]: Secondary | ICD-10-CM

## 2024-09-05 DIAGNOSIS — F17210 Nicotine dependence, cigarettes, uncomplicated: Secondary | ICD-10-CM | POA: Diagnosis not present

## 2024-09-05 DIAGNOSIS — C61 Malignant neoplasm of prostate: Secondary | ICD-10-CM | POA: Diagnosis not present

## 2024-09-06 ENCOUNTER — Ambulatory Visit: Payer: Self-pay

## 2024-09-06 LAB — PSA, TOTAL AND FREE
PSA, Free Pct: 28.3 %
PSA, Free: 0.51 ng/mL
Prostate Specific Ag, Serum: 1.8 ng/mL (ref 0.0–4.0)

## 2024-09-12 ENCOUNTER — Other Ambulatory Visit: Payer: Self-pay

## 2024-09-12 NOTE — Addendum Note (Signed)
 Addended by: LANNY CALLANDER on: 09/12/2024 04:23 PM   Modules accepted: Orders

## 2024-09-15 ENCOUNTER — Telehealth: Payer: Self-pay

## 2024-09-15 NOTE — Telephone Encounter (Addendum)
 Unable to reach the patient @[phone] .LVM stating to contact facility to schedule a telephone visit to go over ct results.  Spoke with patient's wife. Was advised that patient is in [place] . Wife will relay message to patient.  ----- Message from Onita Mattock sent at 09/12/2024  3:36 PM EST ----- I called him but no answers, left VM to call back. Please schedule a phone visit with me tomorrow to review his CT result, thx

## 2024-09-18 NOTE — Progress Notes (Unsigned)
 Assessment:  1.  History of UTI.  Urine is clear today.   2.  Prostate cancer   prior to PAE, PSA was 7.1.  Most recently 1.8.  3.  Nodular prostate with BOO.   He is doing well s/p PAE so I will have him try to wean the tamsulosin  but refilled it in case he can't.   4.  ED.  Tadalafil  refilled.       Plan:    12.2.2025: Recent PSA 1.8.  28.3% free  03/16/24: Daryll returns today in f/u.  He had a PAE on 10/26/23. He is doing well with an IPSS of 6 with some urgency.   He has a better stream. He has had no hematuria.  He remains on tamsulosin . 0.8mg  daily.  He has low risk prostate cancer and his PSA has declined to 1.6 from 7.1 with the PAE.  He has ED and has been on    09/02/23: Kennis returns today in f/u. His UA is clear today.  He hasn't had a PSA prior to this visit. It was scheduled for 11/23.  He has stable LUTS with an IPSS of 12.  He has nocturia x 1.  He remains on tamsulosin  0.8mg  daily.   He has ED and has scripts for tadalafil  and sildenafil  but hasn't been using them much.    06/17/23:  Macio returns today with concerns for a UTI.  He saw his PCP yesterday and had a dip UA that was Nit+ with trace LE.  His UA today has >30 WBC.  He was started on Cipro  yesterday.  HE was having dysuria but no hematuria.  He had no frequency or urgency.  He had increased nocturia last week but that is better.  He feels like he is emptying ok.    He had some discomfort in the left groin but has a small LIH.   He remains on tamsulosin  0.8mg .   03/11/23: Tyreon returns today in f/u.  He has low risk prostate cancer on surveillance since biopsy in 4/22 and has a nodular prostate with obstruction.  He had an MRI on 5/8 that showed a prostate with no high risk lesions.  His PSA is back down to 5.4 after jumping to 12.5 after a bout of COVID.   He remains on tamsulosin  0.8mg  and his IPSS is 12.  He has frequency and urgency with a sensation of incomplete emptying.  He has minimal nocturia.  He  remains on tadalafil  and sildenafil .    11/26/22: Osten returns today in f/u.  He had the repeat PSA on 10/21/22 with his PCP and it was 12.1 which is down slightly.  He has low risk prostate cancer on surveillance since biopsy in 4/22 and has a nodular prostate with obstruction.  His PSA was up to 12.5 in November after coming off of finasteride  for side effects.  He is on tamsulosin  for the LUTS and tadalafil  and sildenafil  for the ED. He had a low risk MRI with a prostate in 5/23.   His IPSS is 15 with urgency.   He had COVID around Christmas.   He had a transient episode of voiding difficulty prior to the episode of COVID but it resolved spontaneously.   08/27/22: Gerrit returns today in f/u.  He was started on finasteride  in May and his PSA fell from 10.5 in 4/23  to 5.9 in 8/23  but he was having sexual side effects and the PSa is now  back up to 12.5.  His PSA has been rather labile in the past and he has a prostate on MRI.  He has low risk prostate cancer with a single core of GG1 disease with 5% involvement on his biopsy from 01/30/21.   05/28/22: Greysin returns today in f/u for history of low risk prostate cancer on AS.  His prostate was on his last MRIP.  His PSA is down to 5.9 with the addition of finasteride  on 02/19/22.  He remains on tamsulosin  as well.  His IPSS is 11 with reduced nocturia but he reports progressive ED since starting it. He was benefiting from sildenafil  and tadalafil  but those aren't working now.    02/19/22: Yafet returns today in f/u.  He had a repeat MRIP on 02/16/22 that showed a prostate with no high risk lesions.  He remains on AS for low risk prostate cancer that was diagnosed on 01/30/21.  His PSA is 10.5 which is up from 8.4 in 10/22 but it was 10.8 on 05/22/21.   He is voiding better with reduced nocturia. He has some urgency.  He has no dysuria or hematuria.   His IPSS is 15.  He has some issues with urgency but minimal nocturia.  He remains on  tamsulosin  but is off of the tadalafil .   He was having some dizziness.  He wasn't responding to the tadalafil .  He was given sildenafil  in 8/22 but didn't have a response either.    02/14/21: Akeen returns today in f/u to discuss his prostate biopsy results.   He was found to have a 48 ml prostate with a single core with 5% Gleason 6 prostate cancer with PNI.  There was another core with atypia at the left apex.   He has T2a Nx Mx disease.  He has preexisting ED and BOO.   He had an MRIP in 5/20 that showed an 80ml prostate with no suspicious lesions. He does have a middle lobe that is primarily on the right on MRI.     GU Hx: Mr. Petties is a 78 yo WM who is sent by Dr. Marvine for an elevated PSA of 4.1. His PSA has been rising steadily for the last few years and was 1.58 in 2011. He had a biopsy for a nodule about 15 years ago. He has moderate LUTS with an IPSS of 11. He is currently on tadalafil  which was started about 2 months ago and has helped his symptoms. He has had no hematuria or dysuria. He had a foley in 7/19 when hospitalized and [facility] with acute pancreatitis possibly from alcohol. He had mild pyuria but a negative culture. He stopped drinking after that. He has a history of Peyronie's disease and had a phalloplasty in 2008 by Dr. Ottelin.               ROS:  ROS:  A complete review of systems was performed.  All systems are negative except for pertinent findings as noted.   Review of Systems  All other systems reviewed and are negative.   No Known Allergies  Outpatient Encounter Medications as of 09/19/2024  Medication Sig   apixaban  (ELIQUIS ) 5 MG TABS tablet Take 2 tablets (10mg ) twice daily for 7 days, then 1 tablet (5mg ) twice daily   Bioflavonoid Products (ESTER C PO) Take 1,000 mg by mouth daily.   dabigatran  (PRADAXA ) 150 MG CAPS capsule Take 1 capsule (150 mg total) by mouth 2 (two) times daily. (Patient  taking differently: Take 150 mg by mouth 2 (two) times  daily. Can't afford Eliquis  refills. S[facility]ran  to [facility] with [facility] coupon which he will start taking AFTER he runs out of Eliquis  starter pack.)   enalapril (VASOTEC) 10 MG tablet Take 10 mg by mouth daily.   furosemide (LASIX) 40 MG tablet Take 40 mg by mouth daily as needed. (Patient not taking: Reported on 09/01/2024)   pravastatin  (PRAVACHOL ) 40 MG tablet Take 40 mg by mouth daily.   tadalafil  (CIALIS ) 5 MG tablet Take up to 4 tablets po as needed daily or up to 2 tablets daily po with 2-3 20mg  sildenafil  as needed for sexual activity.  Do not take within 4 hours of tamsulosin /flomax .   tamsulosin  (FLOMAX ) 0.4 MG CAPS capsule Take 2 capsules (0.8 mg total) by mouth daily.   zolpidem (AMBIEN) 10 MG tablet Take 10 mg by mouth at bedtime.   No facility-administered encounter medications on file as of 09/19/2024.    Past Medical History:  Diagnosis Date   DVT (deep venous thrombosis) (HCC)    Hypercholesteremia    Hypertension     Past Surgical History:  Procedure Laterality Date   CATARACT EXTRACTION, BILATERAL  5 yrs ago   COLONOSCOPY  05/28/2011   Procedure: COLONOSCOPY;  Surgeon: Claudis RAYMOND Rivet, MD;  Location[facility];  Service: Endoscopy;  Laterality: N/A;   COLONOSCOPY N/A 07/31/2021   Procedure: COLONOSCOPY;  Surgeon: Rivet Claudis RAYMOND, MD;  Location: [facility];  Service: Endoscopy;  Laterality: N/A;  13:00   CYSTECTOMY     back & leg   INGUINAL HERNIA REPAIR  07/01/2012   Procedure: HERNIA REPAIR INGUINAL ADULT;  Surgeon: Oneil DELENA Budge, MD;  Location: [facility];  Service: General;  Laterality: Right;  Right Inguinal Herniorraphy with Mesh   IR 3D INDEPENDENT WKST  10/26/2023   IR ANGIOGRAM PELVIS SELECTIVE OR SUPRASELECTIVE  10/26/2023   IR ANGIOGRAM SELECTIVE EACH ADDITIONAL VESSEL  10/26/2023   IR ANGIOGRAM SELECTIVE EACH ADDITIONAL VESSEL  10/26/2023   IR EMBO TUMOR ORGAN ISCHEMIA INFARCT INC GUIDE ROADMAPPING  10/26/2023   IR RADIOLOGIST EVAL & MGMT  09/08/2023   IR  RADIOLOGIST EVAL & MGMT  12/08/2023   IR US  GUIDE VASC ACCESS LEFT  10/26/2023   IR US  GUIDE VASC ACCESS LEFT  10/26/2023   IR US  GUIDE VASC ACCESS LEFT  10/26/2023   Nesbit  Plication     POLYPECTOMY  07/31/2021   Procedure: POLYPECTOMY;  Surgeon: Rivet Claudis RAYMOND, MD;  Location: [facility];  Service: Endoscopy;;   ROBOTIC ASSISTED LAPAROSCOPIC LYSIS OF ADHESION N/A 01/06/2024   Procedure: LYSIS, ADHESIONS, ROBOT-ASSISTED, LAPAROSCOPIC;  Surgeon: Evonnie Dorothyann DELENA, DO;  Location: [facility];  Service: General;  Laterality: N/A;   TONSILLECTOMY     XI ROBOTIC ASSISTED INGUINAL HERNIA REPAIR WITH MESH Left 01/06/2024   Procedure: REPAIR, HERNIA, INGUINAL, ROBOT-ASSISTED, LAPAROSCOPIC, USING MESH;  Surgeon: Evonnie Dorothyann DELENA, DO;  Location: [facility];  Service: General;  Laterality: Left;    Social History   Socioeconomic History   Marital status: Married    Spouse name: Not on file   Number of children: 3   Years of education: Not on file   Highest education level: Not on file  Occupational History   Not on file  Tobacco Use   Smoking status: Former    Current packs/day: 0.00    Types: Cigarettes    Quit date: 02/24/1978    Years since quitting: 46.5  Smokeless tobacco: Never  Vaping Use   Vaping status: Never Used  Substance and Sexual Activity   Alcohol use: Not Currently   Drug use: No   Sexual activity: Yes  Other Topics Concern   Not on file  Social History Narrative   Not on file   Social Drivers of Health   Financial Resource Strain: Not on file  Food Insecurity: Not on file  Transportation Needs: Not on file  Physical Activity: Not on file  Stress: Not on file  Social Connections: Unknown (03/03/2022)   Received [facility]   Social Network    Social Network: Not on file  Intimate Partner Violence: Unknown (01/23/2022)   Received from [facility]   HITS    Physically Hurt: Not on file    Insult or Talk Down To: Not on file    Threaten Physical  Harm: Not on file    Scream or Curse: Not on file    Family History  Problem Relation Age of Onset   Cancer Father        pancreatic cancer       Objective: There were no vitals filed for this visit.     Physical Exam Vitals reviewed.  Constitutional:      Appearance: Normal appearance.  Neurological:     Mental Status: He is alert.     Lab Results:  No results found for this or any previous visit (from the past 24 hours).    His UA from yesterday was reviewed along with the one for today.      Lab Results  Component Value Date   PSA1 1.8 09/05/2024   PSA1 1.6 03/01/2024   PSA1 7.1 (H) 09/02/2023  PSA 12.1 10/21/22.   BMET No results for input(s): NA, K, CL, CO2, GLUCOSE, BUN, CREATININE, CALCIUM in the last 72 hours. PSA  09/30/20: 6.4.  01/07/21:   7.7 with a 21.6% f/t ratio.  01/14/21   8.0  with 19% f/t ratio.  CT Chest Wo Contrast Result Date: 09/09/2024 CLINICAL DATA:  Thirty pack year tobacco abuse EXAM: CT CHEST WITHOUT CONTRAST TECHNIQUE: Multidetector CT imaging of the chest was performed following the standard protocol without IV contrast. RADIATION DOSE REDUCTION: This exam was performed according to the departmental dose-optimization program which includes automated exposure control, adjustment of the mA and/or kV according to patient size and/or use of iterative reconstruction technique. COMPARISON:  10/13/2022, 04/25/2018 FINDINGS: Cardiovascular: Unenhanced imaging of the heart is unremarkable without pericardial effusion. Normal caliber of the thoracic aorta. Atherosclerosis of the aorta and coronary vasculature. Assessment of the vascular lumen cannot be performed without intravenous contrast. Mediastinum/Nodes: No enlarged mediastinal or axillary lymph nodes. Thyroid  gland, trachea, and esophagus demonstrate no significant findings. Lungs/Pleura: No acute airspace disease, effusion, or pneumothorax. Minimal bibasilar subsegmental  atelectasis or scarring. Central airways are patent. There is a stable 5 mm right upper lobe ground-glass pulmonary nodule, reference image 36/4. Multiple small ground-glass nodules are seen within the left upper lobe, largest measuring 6 mm reference image 49/4. Multiple punctate calcified granulomas are seen bilaterally. Upper Abdomen: No acute abnormality. Musculoskeletal: No acute or destructive bony abnormalities. Reconstructed images demonstrate no additional findings. IMPRESSION: 1. Bilateral ground-glass pulmonary nodules, largest in the left upper lobe measuring 6 mm. Non-contrast chest CT at 3-6 months is recommended. If nodules persist, subsequent management will be based upon the most suspicious nodule(s). This recommendation follows the consensus statement: Guidelines for Management of Incidental Pulmonary Nodules Detected on CT  Images: From the Fleischner Society 2017; Radiology 2017; 284:228-243. 2. Aortic Atherosclerosis (ICD10-I70.0). Coronary artery atherosclerosis. Electronically Signed   By: Ozell Daring M.D.   On: 09/09/2024 22:42   US  Venous Img Lower Unilateral Right (DVT) Result Date: 08/10/2024 CLINICAL DATA:  Right lower extremity pain and edema. EXAM: RIGHT LOWER EXTREMITY VENOUS DOPPLER ULTRASOUND TECHNIQUE: Gray-scale sonography with graded compression, as well as color Doppler and duplex ultrasound were performed to evaluate the lower extremity deep venous systems from the level of the common femoral vein and including the common femoral, femoral, profunda femoral, popliteal and calf veins including the posterior tibial, peroneal and gastrocnemius veins when visible. The superficial great saphenous vein was also interrogated. Spectral Doppler was utilized to evaluate flow at rest and with distal augmentation maneuvers in the common femoral, femoral and popliteal veins. COMPARISON:  None Available. FINDINGS: Contralateral Common Femoral Vein: Respiratory phasicity is normal and  symmetric with the symptomatic side. No evidence of thrombus. Normal compressibility. Common Femoral Vein: No evidence of thrombus. Normal compressibility, respiratory phasicity and response to augmentation. Saphenofemoral Junction: No evidence of thrombus. Normal compressibility and flow on color Doppler imaging. Profunda Femoral Vein: No evidence of thrombus. Normal compressibility and flow on color Doppler imaging. Femoral Vein: Occlusive thrombus identified. Popliteal Vein: Occlusive thrombus identified which is echogenic, implicating some degree of chronicity. Calf Veins: Visualized segments of peroneal and posterior tibial veins demonstrate thrombus. Superficial Great Saphenous Vein: No evidence of thrombus. Normal compressibility. Venous Reflux:  None. Other Findings: No evidence of superficial thrombophlebitis or abnormal fluid collection. IMPRESSION: Positive for deep vein thrombosis in the right lower extremity with occlusive thrombus in the right femoral, popliteal and calf veins. The popliteal vein thrombus is echogenic, implicating some degree of chronicity. Electronically Signed   By: Marcey Moan M.D.   On: 08/10/2024 11:17   I reviewed the CT from 2019 and the hernia on the left was present.  Assessment & Plan:

## 2024-09-19 ENCOUNTER — Ambulatory Visit: Admitting: Urology

## 2024-09-19 VITALS — BP 138/70 | HR 66

## 2024-09-19 DIAGNOSIS — N529 Male erectile dysfunction, unspecified: Secondary | ICD-10-CM

## 2024-09-19 DIAGNOSIS — N138 Other obstructive and reflux uropathy: Secondary | ICD-10-CM

## 2024-09-19 DIAGNOSIS — N403 Nodular prostate with lower urinary tract symptoms: Secondary | ICD-10-CM | POA: Diagnosis not present

## 2024-09-19 DIAGNOSIS — C61 Malignant neoplasm of prostate: Secondary | ICD-10-CM

## 2024-09-19 DIAGNOSIS — Z87440 Personal history of urinary (tract) infections: Secondary | ICD-10-CM

## 2024-09-19 DIAGNOSIS — R972 Elevated prostate specific antigen [PSA]: Secondary | ICD-10-CM

## 2024-09-19 DIAGNOSIS — N5201 Erectile dysfunction due to arterial insufficiency: Secondary | ICD-10-CM

## 2024-09-19 LAB — URINALYSIS, ROUTINE W REFLEX MICROSCOPIC
Bilirubin, UA: NEGATIVE
Glucose, UA: NEGATIVE
Ketones, UA: NEGATIVE
Leukocytes,UA: NEGATIVE
Nitrite, UA: NEGATIVE
Protein,UA: NEGATIVE
Specific Gravity, UA: 1.02 (ref 1.005–1.030)
Urobilinogen, Ur: 0.2 mg/dL (ref 0.2–1.0)
pH, UA: 6 (ref 5.0–7.5)

## 2024-09-19 LAB — MICROSCOPIC EXAMINATION
Bacteria, UA: NONE SEEN
WBC, UA: NONE SEEN /HPF (ref 0–5)

## 2024-09-19 LAB — BLADDER SCAN AMB NON-IMAGING: Scan Result: 3

## 2024-09-19 NOTE — Progress Notes (Signed)
 Bladder Scan completed today due to reason of  Nodular prostate with lower urinary tract symptoms  Patient can void prior to the bladder scan. Bladder scan result: 3  Performed By: Exie DASEN. CMA  Additional notes- Patient is scheduled to follow up with MD

## 2024-10-16 ENCOUNTER — Encounter: Payer: Self-pay | Admitting: *Deleted

## 2024-10-23 ENCOUNTER — Other Ambulatory Visit (HOSPITAL_COMMUNITY): Payer: Self-pay

## 2024-10-23 ENCOUNTER — Telehealth: Payer: Self-pay

## 2024-10-23 ENCOUNTER — Encounter: Payer: Self-pay | Admitting: Student-PharmD

## 2024-10-23 ENCOUNTER — Ambulatory Visit: Attending: Surgery | Admitting: Student-PharmD

## 2024-10-23 DIAGNOSIS — I82411 Acute embolism and thrombosis of right femoral vein: Secondary | ICD-10-CM | POA: Diagnosis not present

## 2024-10-23 MED ORDER — DABIGATRAN ETEXILATE MESYLATE 150 MG PO CAPS: 150.0000 mg | ORAL_CAPSULE | Freq: Two times a day (BID) | ORAL | 5 refills | Status: AC

## 2024-10-23 NOTE — Telephone Encounter (Signed)
 Patient Advocate Encounter  Test billing for this patient's current coverage ([employer]) returns a $249 copay for 30 day supply of Eliquis , and $630.08 for 90 days. This patient currently has an unmet deductible of $601.  This test claim was processed through [facility]- copay amounts may vary at other pharmacies due to pharmacy/plan contracts, or as the patient moves through the different stages of their insurance plan.  Rachel DEL, CPhT Rx Patient Advocate Phone: [phone]

## 2024-10-23 NOTE — Patient Instructions (Signed)
-  Continue dabigatran  (Pradaxa ) 150 mg twice daily. -[facility].  -It is important to take your medication around the same time every day.  -Avoid NSAIDs like ibuprofen  (Advil , Motrin ) and naproxen  (Aleve ) as well as aspirin doses over 100 mg daily. -Tylenol  (acetaminophen ) is the preferred over the counter pain medication to lower the risk of bleeding. -Be sure to alert all of your health care providers that you are taking an anticoagulant prior to starting a new medication or having a procedure. -Monitor for signs and symptoms of bleeding (abnormal bruising, prolonged bleeding, nose bleeds, bleeding from gums, discolored urine, black tarry stools). If you have fallen and hit your head OR if your bleeding is severe or not stopping, seek emergency care.  -Go to the emergency room if emergent signs and symptoms of new clot occur (new or worse swelling and pain in an arm or leg, shortness of breath, chest pain, fast or irregular heartbeats, lightheadedness, dizziness, fainting, coughing up blood) or if you experience a significant color change (pale or blue) in the extremity that has the DVT.  -We recommend you wear compression stockings (20-30 mmHg) as long as you are having swelling or pain. Be sure to purchase the correct size and take them off at night.   If you have any questions or need to reschedule an appointment, please call [phone]. If you are having an emergency, call 911 or present to the nearest emergency room.   What is a DVT?  -Deep vein thrombosis (DVT) is a condition in which a blood clot forms in a vein of the deep venous system which can occur in the lower leg, thigh, pelvis, arm, or neck. This condition is serious and can be life-threatening if the clot travels to the arteries of the lungs and causing a blockage (pulmonary embolism, PE). A DVT can also damage veins in the leg, which can lead to long-term venous disease, leg pain, swelling, discoloration,  and ulcers or sores (post-thrombotic syndrome).  -Treatment may include taking an anticoagulant medication to prevent more clots from forming and the current clot from growing, wearing compression stockings, and/or surgical procedures to remove or dissolve the clot.

## 2024-10-23 NOTE — Progress Notes (Addendum)
 " DVT Clinic Note  Name: Matthew Payne     MRN: [id number]     DOB: [birth year]     Sex: male  PCP: [name], MD  Today's Visit: Visit Information: Follow Up Visit  Referred to DVT Clinic by: Emergency Department - Dr. Towana Referred to CPP by: Dr. Serene Reason for referral:  Chief Complaint  Patient presents with   Med Management - DVT   HISTORY OF PRESENT ILLNESS: Matthew Payne is a 79 y.o. male with PMH HTN, HLD, prostate cancer managed by watchful waiting, who presents for follow up medication management for DVT. Patient was diagnosed with RLE DVT on outpatient imaging 08/10/24 and his PCP prescribed Eliquis  but he was unable to afford this at the pharmacy, so his PCP sent him to the ED, who gave him a free card and referred him to DVT Clinic for follow up. He still had issues at the pharmacy and spoke with DVT Clinic pharmacist Katie on 08/11/24 who contacted the pharmacy on his behalf, provided them with the free card information, and confirmed the patient could pick it up that day. Last seen in DVT Clinic 08/14/24 at which time we planned to continue Eliquis  for one month to finish out the starter pack he used the one time free card for, but due to high deductible planned to switch to generic dabigatran  which was affordable through the remainder of 2025 and revisit costs in 2026. Referred him to hematology due to no clear provoking risk factors. He established with them on 09/01/24. They plan to anticoagulate for at least 6 months, then repeat his ultrasound in May and determine if he needs indefinite anticoagulation at that time.   Today, patient reports that his swelling improved some but has not resolved. Denies pain. Denies abnormal bleeding or bruising. Denies missed doses of dabigatran . Is wearing compression stockings.   Positive Thrombotic Risk Factors: Older Age Bleeding Risk Factors: Age >65 years, Anticoagulant therapy  Negative Thrombotic Risk Factors: Previous VTE,  Recent surgery (within 3 months), Recent trauma (within 3 months), Recent admission to hospital with acute illness (within 3 months), Paralysis, paresis, or recent plaster cast immobilization of lower extremity, Central venous catheterization, Bed rest >72 hours within 3 months, Sedentary journey lasting >8 hours within 4 weeks, Pregnancy, Within 6 weeks postpartum, Recent cesarean section (within 3 months), Estrogen therapy, Testosterone therapy, Erythropoiesis-stimulating agent, Recent COVID diagnosis (within 3 months), Active cancer, Non-malignant, chronic inflammatory condition, Known thrombophilic condition, Smoking, Obesity  Rx Insurance Coverage: Medicare Rx Affordability: Patient has $601 left on his deductible. First month of Eliquis  would be $249 and would remain high until deductible has been met. Unable to see what copay would be after that. Dabigatran  is $45/month at [facility] using the Good[facility] coupon we provided at his last visit.  Rx Assistance Provided: None needed at this time. Has already used one time free card for initial fill.  [facility] [place] ([facility]).   Past Medical History:  Diagnosis Date   DVT (deep venous thrombosis) (HCC)    Hypercholesteremia    Hypertension     Past Surgical History:  Procedure Laterality Date   CATARACT EXTRACTION, BILATERAL  5 yrs ago   COLONOSCOPY  05/28/2011   Procedure: COLONOSCOPY;  Surgeon: Claudis RAYMOND Rivet, MD;  [facility]: [facility];  Service: Endoscopy;  Laterality: N/A;   COLONOSCOPY N/A 07/31/2021   Procedure: COLONOSCOPY;  Surgeon: Rivet Claudis RAYMOND, MD;  Location: [facility];  Service: Endoscopy;  Laterality: N/A;  13:00   CYSTECTOMY     back & leg   INGUINAL HERNIA REPAIR  07/01/2012   Procedure: HERNIA REPAIR INGUINAL ADULT;  Surgeon: Oneil DELENA Budge, MD;  Location: [facility];  Service: General;  Laterality: Right;  Right Inguinal Herniorraphy with Mesh   IR 3D INDEPENDENT WKST  10/26/2023    IR ANGIOGRAM PELVIS SELECTIVE OR SUPRASELECTIVE  10/26/2023   IR ANGIOGRAM SELECTIVE EACH ADDITIONAL VESSEL  10/26/2023   IR ANGIOGRAM SELECTIVE EACH ADDITIONAL VESSEL  10/26/2023   IR EMBO TUMOR ORGAN ISCHEMIA INFARCT INC GUIDE ROADMAPPING  10/26/2023   IR RADIOLOGIST EVAL & MGMT  09/08/2023   IR RADIOLOGIST EVAL & MGMT  12/08/2023   IR US  GUIDE VASC ACCESS LEFT  10/26/2023   IR US  GUIDE VASC ACCESS LEFT  10/26/2023   IR US  GUIDE VASC ACCESS LEFT  10/26/2023   Nesbit  Plication     POLYPECTOMY  07/31/2021   Procedure: POLYPECTOMY;  Surgeon: Golda Claudis PENNER, MD;  Location: [facility];  Service: Endoscopy;;   ROBOTIC ASSISTED LAPAROSCOPIC LYSIS OF ADHESION N/A 01/06/2024   Procedure: LYSIS, ADHESIONS, ROBOT-ASSISTED, LAPAROSCOPIC;  Surgeon: Evonnie Dorothyann DELENA, DO;  Location: [facility];  Service: General;  Laterality: N/A;   TONSILLECTOMY     XI ROBOTIC ASSISTED INGUINAL HERNIA REPAIR WITH MESH Left 01/06/2024   Procedure: REPAIR, HERNIA, INGUINAL, ROBOT-ASSISTED, LAPAROSCOPIC, USING MESH;  Surgeon: Evonnie Dorothyann DELENA, DO;  Location: [facility];  Service: General;  Laterality: Left;    Social History   Socioeconomic History   Marital status: Married    Spouse name: Not on file   Number of children: 3   Years of education: Not on file   Highest education level: Not on file  Occupational History   Not on file  Tobacco Use   Smoking status: Former    Current packs/day: 0.00    Types: Cigarettes    Quit date: 02/24/1978    Years since quitting: 46.6   Smokeless tobacco: Never  Vaping Use   Vaping status: Never Used  Substance and Sexual Activity   Alcohol use: Not Currently   Drug use: No   Sexual activity: Yes  Other Topics Concern   Not on file  Social History Narrative   Not on file   Social Drivers of Health   Tobacco Use: Medium Risk (09/01/2024)   Patient History    Smoking Tobacco Use: Former    Smokeless Tobacco Use: Never    Passive Exposure: Not on [employer]or, Minerals Strain: Not on file  Food Insecurity: Not on file  Transportation Needs: Not on file  Physical Activity: Not on file  Stress: Not on file  Social Connections: Unknown (03/03/2022)   Received from [facility]   Social Network    Social Network: Not on file  Intimate Partner Violence: Unknown (01/23/2022)   Received from [facility]   HITS    Physically Hurt: Not on file    Insult or Talk Down To: Not on file    Threaten Physical Harm: Not on file    Scream or Curse: Not on file  Depression (PHQ2-9): Low Risk (09/01/2024)   Depression (PHQ2-9)    PHQ-2 Score: 0  Alcohol Screen: Not on file  Housing: Not on file  Utilities: Not on file  Health Literacy: Not on file    Family History  Problem Relation Age of Onset   Cancer Father  pancreatic cancer    Allergies as of 10/23/2024   (No Known Allergies)    Medications Ordered Prior to Encounter[1] REVIEW OF SYSTEMS:  Review of Systems  Respiratory:  Negative for shortness of breath.   Cardiovascular:  Positive for leg swelling. Negative for chest pain and palpitations.  Musculoskeletal:  Negative for myalgias.  Neurological:  Negative for dizziness and tingling.   PHYSICAL EXAMINATION:  Physical Exam Musculoskeletal:        General: No tenderness.     Right lower leg: Edema (2+) present.     Left lower leg: Edema (1+) present.  Skin:    Findings: No bruising or erythema.   Villalta Score for Post-Thrombotic Syndrome: Pain: Absent Cramps: Absent Heaviness: Mild Paresthesia: Absent Pruritus: Absent Pretibial Edema: Moderate Skin Induration: Absent Hyperpigmentation: Absent Redness: Absent Venous Ectasia: Absent Pain on calf compression: Absent Villalta Preliminary Score: 3 Is venous ulcer present?: No If venous ulcer is present and score is <15, then 15 points total are assigned: Absent Villalta Total Score: 3  LABS:  CBC     Component Value Date/Time   WBC 9.0 12/05/2023 1134   RBC 4.80  12/05/2023 1134   HGB 14.6 12/05/2023 1134   HCT 44.3 12/05/2023 1134   PLT 166 12/05/2023 1134   MCV 92.3 12/05/2023 1134   MCH 30.4 12/05/2023 1134   MCHC 33.0 12/05/2023 1134   RDW 13.0 12/05/2023 1134   LYMPHSABS 2.1 10/26/2023 1000   MONOABS 0.6 10/26/2023 1000   EOSABS 0.1 10/26/2023 1000   BASOSABS 0.0 10/26/2023 1000    Hepatic Function      Component Value Date/Time   PROT 7.1 12/05/2023 1134   ALBUMIN 4.0 12/05/2023 1134   AST 17 12/05/2023 1134   ALT 18 12/05/2023 1134   ALKPHOS 65 12/05/2023 1134   BILITOT 0.8 12/05/2023 1134    Renal Function   Lab Results  Component Value Date   CREATININE 0.82 12/05/2023   CREATININE 0.72 10/26/2023   CREATININE 0.84 10/13/2022    CrCl cannot be calculated (Patient's most recent lab result is older than the maximum 21 days allowed.).   [facility] Vascular Lab Studies:  08/10/24 doppler FINDINGS: Contralateral Common Femoral Vein: Respiratory phasicity is normal and symmetric with the symptomatic side. No evidence of thrombus. Normal compressibility.   Common Femoral Vein: No evidence of thrombus. Normal compressibility, respiratory phasicity and response to augmentation.   Saphenofemoral Junction: No evidence of thrombus. Normal compressibility and flow on color Doppler imaging.   Profunda Femoral Vein: No evidence of thrombus. Normal compressibility and flow on color Doppler imaging.   Femoral Vein: Occlusive thrombus identified.   Popliteal Vein: Occlusive thrombus identified which is echogenic, implicating some degree of chronicity.   Calf Veins: Visualized segments of peroneal and posterior tibial veins demonstrate thrombus.   Superficial Great Saphenous Vein: No evidence of thrombus. Normal compressibility.   Venous Reflux:  None.   Other Findings: No evidence of superficial thrombophlebitis or abnormal fluid collection.   IMPRESSION: Positive for deep vein thrombosis in the right lower extremity  with occlusive thrombus in the right femoral, popliteal and calf veins. The popliteal vein thrombus is echogenic, implicating some degree of chronicity.  ASSESSMENT: Location of DVT: Right femoral vein, Right popliteal vein, Right distal vein Cause of DVT: unprovoked  Patient without prior history of DVT diagnosed with DVT extending from the right femoral through the calf veins. Popliteal thrombus appeared to be more chronic. Given that his symptoms started a year and a  half ago, it is difficult to assess provoking risk factors. We referred him to hematology who he established with on 09/01/24, they plan to anticoagulate for at least 6 months, then repeat his ultrasound in April and determine if he needs indefinite anticoagulation at that time.   In the meantime, we are following up due to concerns with DOAC costs. He was able to fill the Eliquis  starter pack for his first month supply with the one time free savings card. He had a high deductible in 2025, so we switched to dabigatran  after the first month supply of Eliquis . Dabigatran  has an affordable coupon through [employer], and he has been adherent to treatment without any adverse effects. Our patient advocate investigated costs for 2026 since insurance has reset and he still has a very high deductible. He would like to remain on dabigatran  at this time. Provided refills to last through his next hematology appt. Will defer decisions on duration of treatment and further refills to them.   PLAN: -Continue dabigatran  (Pradaxa ) 150 mg twice daily. -Expected duration of therapy: per hematology. Therapy started on 08/11/24. -Patient educated on purpose, proper use and potential adverse effects of dabigatran  (Pradaxa ). -Discussed importance of taking medication around the same time every day. -Advised patient of medications to avoid (NSAIDs, aspirin doses >100 mg daily). -Educated that Tylenol  (acetaminophen ) is the preferred analgesic to lower the risk  of bleeding. -Advised patient to alert all providers of anticoagulation therapy prior to starting a new medication or having a procedure. -Emphasized importance of monitoring for signs and symptoms of bleeding (abnormal bruising, prolonged bleeding, nose bleeds, bleeding from gums, discolored urine, black tarry stools). -Educated patient to present to the ED if emergent signs and symptoms of new thrombosis occur. -Counseled patient to wear compression stockings daily, removing at night.  Follow up: with hematology. DVT Clinic as needed.   Lum Herald, PharmD, [place], CPP [facility] [phone]   I have evaluated the patient's chart/imaging and refer this patient to the Clinical Pharmacist Practitioner for medication management. I have reviewed the CPP's documentation and agree with her assessment and plan. I was immediately available during the visit for questions and collaboration.   Malvina New, MD     [1]  Current Outpatient Medications on File Prior to Visit  Medication Sig Dispense Refill   Bioflavonoid Products (ESTER C PO) Take 1,000 mg by mouth daily.     enalapril (VASOTEC) 10 MG tablet Take 10 mg by mouth daily.     pravastatin  (PRAVACHOL ) 40 MG tablet Take 40 mg by mouth daily.     zolpidem (AMBIEN) 10 MG tablet Take 10 mg by mouth at bedtime.     tadalafil  (CIALIS ) 5 MG tablet Take up to 4 tablets po as needed daily or up to 2 tablets daily po with 2-3 20mg  sildenafil  as needed for sexual activity.  Do not take within 4 hours of tamsulosin /flomax . 30 tablet 11   No current facility-administered medications on file prior to visit.   "

## 2025-02-06 ENCOUNTER — Other Ambulatory Visit (HOSPITAL_COMMUNITY)

## 2025-02-16 ENCOUNTER — Inpatient Hospital Stay: Admitting: Oncology

## 2025-03-20 ENCOUNTER — Other Ambulatory Visit

## 2025-09-19 ENCOUNTER — Ambulatory Visit: Admitting: Urology
